# Patient Record
Sex: Female | Born: 1980
Health system: Southern US, Community
[De-identification: ages and names within clinical notes are randomized; demographics above are authoritative.]

## PROBLEM LIST (undated history)

## (undated) DIAGNOSIS — N200 Calculus of kidney: Secondary | ICD-10-CM

## (undated) DIAGNOSIS — Z9889 Other specified postprocedural states: Secondary | ICD-10-CM

## (undated) DIAGNOSIS — S99922A Unspecified injury of left foot, initial encounter: Secondary | ICD-10-CM

## (undated) DIAGNOSIS — K760 Fatty (change of) liver, not elsewhere classified: Secondary | ICD-10-CM

## (undated) DIAGNOSIS — E039 Hypothyroidism, unspecified: Secondary | ICD-10-CM

## (undated) DIAGNOSIS — M109 Gout, unspecified: Secondary | ICD-10-CM

## (undated) DIAGNOSIS — R112 Nausea with vomiting, unspecified: Secondary | ICD-10-CM

## (undated) DIAGNOSIS — E119 Type 2 diabetes mellitus without complications: Secondary | ICD-10-CM

## (undated) DIAGNOSIS — D649 Anemia, unspecified: Secondary | ICD-10-CM

## (undated) HISTORY — PX: NEPHRECTOMY: SHX65

## (undated) HISTORY — PX: URETEROSCOPY: SHX842

## (undated) HISTORY — DX: Calculus of kidney: N20.0

## (undated) HISTORY — PX: OTHER SURGICAL HISTORY: SHX169

## (undated) HISTORY — PX: PERCUTANEOUS NEPHROSTOMY: SHX2208

## (undated) HISTORY — PX: EXCISION OF ENDOMETRIOMA: SHX6473

## (undated) HISTORY — PX: TUBAL LIGATION: SHX77

---

## 2014-07-18 ENCOUNTER — Emergency Department (HOSPITAL_COMMUNITY)
Admission: EM | Admit: 2014-07-18 | Discharge: 2014-07-19 | Disposition: A | Discharge status: Home / Self Care | Payer: BLUE CROSS/BLUE SHIELD | Attending: Emergency Medicine | Admitting: Emergency Medicine

## 2014-07-18 ENCOUNTER — Encounter (HOSPITAL_COMMUNITY): Payer: Self-pay | Admitting: *Deleted

## 2014-07-18 DIAGNOSIS — M79661 Pain in right lower leg: Secondary | ICD-10-CM | POA: Diagnosis present

## 2014-07-18 DIAGNOSIS — L03115 Cellulitis of right lower limb: Secondary | ICD-10-CM | POA: Diagnosis not present

## 2014-07-18 DIAGNOSIS — E119 Type 2 diabetes mellitus without complications: Secondary | ICD-10-CM | POA: Insufficient documentation

## 2014-07-18 DIAGNOSIS — Z87442 Personal history of urinary calculi: Secondary | ICD-10-CM | POA: Insufficient documentation

## 2014-07-18 HISTORY — DX: Hypothyroidism, unspecified: E03.9

## 2014-07-18 HISTORY — DX: Calculus of kidney: N20.0

## 2014-07-18 HISTORY — DX: Type 2 diabetes mellitus without complications: E11.9

## 2014-07-18 NOTE — ED Notes (Signed)
Patient presents with c/o pain to the right upper thigh.  States it is red, warm to touch and feels like it is spreading.  Tried using a warm compress but that did not help

## 2014-07-19 MED ORDER — CEPHALEXIN 250 MG PO CAPS
500.0000 mg | ORAL_CAPSULE | Freq: Once | ORAL | Status: AC
  Administered 2014-07-19: 500 mg via ORAL
  Filled 2014-07-19: qty 2

## 2014-07-19 MED ORDER — SULFAMETHOXAZOLE-TRIMETHOPRIM 800-160 MG PO TABS
1.0000 | ORAL_TABLET | Freq: Once | ORAL | Status: AC
  Administered 2014-07-19: 1 via ORAL
  Filled 2014-07-19: qty 1

## 2014-07-19 MED ORDER — SULFAMETHOXAZOLE-TRIMETHOPRIM 800-160 MG PO TABS: 1.0000 | ORAL_TABLET | Freq: Two times a day (BID) | ORAL | Status: DC

## 2014-07-19 MED ORDER — CEPHALEXIN 500 MG PO CAPS: 500.0000 mg | ORAL_CAPSULE | Freq: Four times a day (QID) | ORAL | Status: DC

## 2014-07-19 NOTE — ED Provider Notes (Signed)
CSN: [id number]     Arrival date & time 07/18/14  2235 History   First MD Initiated Contact with Patient 07/18/14 2345     Chief Complaint  Patient presents with  . Leg Pain     (Consider location/radiation/quality/duration/timing/severity/associated sxs/prior Treatment) HPI Patient is a 34 year old female with past medical history of diabetes who presents the ER complaining of pain and redness to her upper lateral thigh. Patient states this afternoon around 2 PM she noticed what she thought was a pimple, and throughout the day today and tonight her affect at area grew in size and in pain. Patient reports associated redness and tenderness to the touch of the area. Patient denies fever, nausea, vomiting, numbness, weakness, tingling.  Past Medical History  Diagnosis Date  . Diabetes mellitus without complication   . Kidney stones   . Hypothyroidism    Past Surgical History  Procedure Laterality Date  . Nephrectomy     No family history on file. History  Substance Use Topics  . Smoking status: Never Smoker   . Smokeless tobacco: Never Used  . Alcohol Use: No   OB History    No data available     Review of Systems  Constitutional: Negative for fever and chills.  Skin: Positive for rash.      Allergies  Doxycycline and Toradol  Home Medications   Prior to Admission medications   Medication Sig Start Date End Date Taking? Authorizing Provider  cephALEXin (KEFLEX) 500 MG capsule Take 1 capsule (500 mg total) by mouth 4 (four) times daily. 07/19/14   Dahlia Bailiff, PA-C  sulfamethoxazole-trimethoprim (BACTRIM DS,SEPTRA DS) 800-160 MG per tablet Take 1 tablet by mouth 2 (two) times daily. 07/19/14   Dahlia Bailiff, PA-C   BP 132/82 mmHg  Pulse 78  Temp(Src) 98.4 F (36.9 C)  Resp 18  Ht 5\' 4"  (1.626 m)  Wt 272 lb 7 oz (123.577 kg)  BMI 46.74 kg/m2  SpO2 95%  LMP 07/16/2014 Physical Exam  Constitutional: She is oriented to person, place, and time. She appears well-developed  and well-nourished. No distress.  HENT:  Head: Normocephalic and atraumatic.  Eyes: Right eye exhibits no discharge. Left eye exhibits no discharge. No scleral icterus.  Neck: Normal range of motion.  Pulmonary/Chest: Effort normal. No respiratory distress.  Musculoskeletal: Normal range of motion.  Neurological: She is alert and oriented to person, place, and time.  Skin: Skin is warm and dry. She is not diaphoretic.  Patient has 1 x 1 cm area of induration with surrounding erythema of approximately 3 x 3 cm to the lateral posterior aspect of her upper right thigh.  Psychiatric: She has a normal mood and affect.  Nursing note and vitals reviewed.   ED Course  Procedures (including critical care time) Labs Review Labs Reviewed - No data to display  Imaging Review No results found.   EKG Interpretation None      MDM   Final diagnoses:  Cellulitis of right lower extremity    Patient has cutaneous skin cellulitis surrounding. Skin is indurated, not amenable to incision and drainage. Ultrasound was performed, noted was some mild cobblestoning consistent with induration and cellulitis without obvious fluid pocket for drainage. Patient without any systemic signs or symptoms. Initially triage note has patient with tachycardia at 114. On exam patient Lamonte Sakai rate was in the 80s to 90s. Patient afebrile, hemodynamically stable. We'll discharge patient with oral antibiotics due to the fact that she is diabetic, encourage warm soaks, encouraged follow-up  with a primary care physician and provided her a resource guide to help her find one. Return precautions discussed, patient verbalizes understanding and agreement of this plan. I encouraged patient to call or return to the ER with any worsening symptoms or should she have any questions or concerns.  BP 132/82 mmHg  Pulse 78  Temp(Src) 98.4 F (36.9 C)  Resp 18  Ht 5\' 4"  (1.626 m)  Wt 272 lb 7 oz (123.577 kg)  BMI 46.74 kg/m2  SpO2 95%   LMP 07/16/2014  Signed,  Dahlia Bailiff, PA-C 12:29 AM  Patient seen and discussed with Dr. Davonna Belling, MD    Dahlia Bailiff, PA-C 07/19/14 9242  Davonna Belling, MD 07/21/14 [phone]

## 2014-07-19 NOTE — Discharge Instructions (Signed)
Abscess An abscess is an infected area that contains a collection of pus and debris.It can occur in almost any part of the body. An abscess is also known as a furuncle or boil. CAUSES  An abscess occurs when tissue gets infected. This can occur from blockage of oil or sweat glands, infection of hair follicles, or a minor injury to the skin. As the body tries to fight the infection, pus collects in the area and creates pressure under the skin. This pressure causes pain. People with weakened immune systems have difficulty fighting infections and get certain abscesses more often.  SYMPTOMS Usually an abscess develops on the skin and becomes a painful mass that is red, warm, and tender. If the abscess forms under the skin, you may feel a moveable soft area under the skin. Some abscesses break open (rupture) on their own, but most will continue to get worse without care. The infection can spread deeper into the body and eventually into the bloodstream, causing you to feel ill.  DIAGNOSIS  Your caregiver will take your medical history and perform a physical exam. A sample of fluid may also be taken from the abscess to determine what is causing your infection. TREATMENT  Your caregiver may prescribe antibiotic medicines to fight the infection. However, taking antibiotics alone usually does not cure an abscess. Your caregiver may need to make a small cut (incision) in the abscess to drain the pus. In some cases, gauze is packed into the abscess to reduce pain and to continue draining the area. HOME CARE INSTRUCTIONS   Only take over-the-counter or prescription medicines for pain, discomfort, or fever as directed by your caregiver.  If you were prescribed antibiotics, take them as directed. Finish them even if you start to feel better.  If gauze is used, follow your caregiver's directions for changing the gauze.  To avoid spreading the infection:  Keep your draining abscess covered with a  bandage.  Wash your hands well.  Do not share personal care items, towels, or whirlpools with others.  Avoid skin contact with others.  Keep your skin and clothes clean around the abscess.  Keep all follow-up appointments as directed by your caregiver. SEEK MEDICAL CARE IF:   You have increased pain, swelling, redness, fluid drainage, or bleeding.  You have muscle aches, chills, or a general ill feeling.  You have a fever. MAKE SURE YOU:   Understand these instructions.  Will watch your condition.  Will get help right away if you are not doing well or get worse. Document Released: 12/31/2004 Document Revised: 09/22/2011 Document Reviewed: 06/05/2011 [facility] Patient Information 2015 [place], [place]. This information is not intended to replace advice given to you by your health care provider. Make sure you discuss any questions you have with your health care provider.   Emergency Department Resource Guide 1) Find a Doctor and Pay Out of Pocket Although you won't have to find out who is covered by your insurance plan, it is a good idea to ask around and get recommendations. You will then need to call the office and see if the doctor you have chosen will accept you as a new patient and what types of options they offer for patients who are self-pay. Some doctors offer discounts or will set up payment plans for their patients who do not have insurance, but you will need to ask so you aren't surprised when you get to your appointment.  2) Contact Your Local Health Department Not all health departments  have doctors that can see patients for sick visits, but many do, so it is worth a call to see if yours does. If you don't know where your local health department is, you can check in your phone book. The CDC also has a tool to help you locate your state's health department, and many state websites also have listings of all of their local health departments.  3) Find a [facility] If  your illness is not likely to be very severe or complicated, you may want to try a walk in clinic. These are popping up all over the country in pharmacies, drugstores, and shopping centers. They're usually staffed by nurse practitioners or physician assistants that have been trained to treat common illnesses and complaints. They're usually fairly quick and inexpensive. However, if you have serious medical issues or chronic medical problems, these are probably not your best option.  No Primary Care Doctor: - Call Health Connect at  [phone] - they can help you locate a primary care doctor that  accepts your insurance, provides certain services, etc. - Physician Referral Service- [phone]  Chronic Pain Problems: Organization         Address  Phone   Notes  [facility]  [phone] Patients need to be referred by their primary care doctor.   Medication Assistance: Organization         Address  Phone   Notes  [facility] Medication [facility] [address] [place], [place] [address] [phone] --Must be a resident of [facility] -- Must have NO insurance coverage whatsoever (no Medicaid/ Medicare, etc.) -- The pt. MUST have a primary care doctor that directs their care regularly and follows them in the community   MedAssist  [phone]   [employer]  [phone]    Agencies that provide inexpensive medical care: Organization         Address  Phone   Notes  [place]  [phone]   [facility]    [phone]   [facility] [place], [place] [address] [phone]   [place] [address] [place] [phone]   Planned Parenthood    [phone]   [facility]    [phone]   [place] and [address], [place] Phone:  [phone], Fax:  [phone] Hours of  Operation:  9 am - 6 pm, M-F.  Also accepts Medicaid/Medicare and self-pay.  [facility] for [address] [place], [address], [place] Phone: [phone], Fax: [phone]. Hours of Operation:  8:30 am - 5:30 pm, M-F.  Also accepts Medicaid and self-pay.  [facility] [place] [address] [place] Phone: [phone]   [place], [place], [place] [phone], Ext. [phone] Mondays & Thursdays: 7-9 AM.  First 15 patients are seen on a first come, first serve basis.    [place] Providers:  Organization         Address  Phone   Notes  [facility] [address], [place] [phone] Also accepts self-pay patients.  [place], [place]  [phone]   [place], [address], [place] [phone]   [facility]  [address] [place] [phone]   Lucianne Lei [address], [place]   [phone] Only accepts [place] Access [place] patients after they have their name applied to their card.   Self-Pay (no insurance) in [facility]:  Organization         Address  Phone   Notes  Sickle Cell Patients, [facility] [place] [phone]   [facility] [place] [phone]   [facility] [place]  [place], [place], [place] [phone]   [facility]/Dr. Osei-Bonsu  [address] [place] or [address], [place] [phone] Phone number for both [place] and [place] locations is the same.  Urgent Medical and [facility] [address] [place] [phone]   [facility] [address], [place] or [address] [phone]   [facility] [address] [place] [phone], phone; [phone], fax Sees patients 1st and 3rd Saturday of every month.  Must not qualify for public or private insurance (i.e. Medicaid, Medicare, [place] Health Choice, Veterans' Benefits)  Household income should be no more than 200% of the poverty level The clinic cannot treat you if you are pregnant or think you are pregnant  Sexually transmitted diseases are not treated at the clinic.    Dental Care: Organization         Address  Phone  Notes  [facility] Department of [facility] [place] [phone] Accepts children up to age 16 who are enrolled in [place] or [place]; pregnant women with a Medicaid card; and children who have applied for Medicaid or [place] Health Choice, but were declined, whose parents can pay a reduced fee at time of service.  [facility] Department of [facility]  [address], [place] [phone] Accepts children up to age 4 who are enrolled in [place] or [place]; pregnant women with a Medicaid card; and children who have applied for Medicaid or [place] Health Choice, but were declined, whose parents can pay a reduced fee at time of service.  [place] Adult Dental Access PROGRAM  [place] [phone] Patients are seen by appointment only. Walk-ins are not accepted. [place] will see patients 78 years of age and older. Monday - Tuesday (8am-5pm) Most Wednesdays (8:30-5pm) $30 per visit, cash only  [facility] Adult Dental Access PROGRAM  [address], [facility] [phone] Patients are seen by appointment only. Walk-ins are not accepted. [place] will see patients 50 years of age and older. One Wednesday Evening (Monthly: Volunteer Based).  $30 per visit, cash only  [place]  [phone] for adults; Children under age 52, call Graduate Pediatric  Dentistry at [phone]. Children aged 62-14, please call [phone] to request a pediatric application.  Dental services are provided in all areas of dental care including fillings, crowns and bridges, complete and partial dentures, implants, gum treatment, root canals, and extractions. Preventive care is also provided. Treatment is provided to both adults and children. Patients are selected via a lottery and there is often a waiting list.   [facility] [address], [place]  [phone] [email or web address]  [address] [place], [place] [phone], [place] Thursday of each month, opens at 6:30 AM; Clinic ends at 9 AM.  Patients are seen on a first-come first-served basis, and a limited number are seen during each clinic.   [facility]  [address] Hillard Danker [place], [place] [phone]   Eligibility Requirements You must have lived in [place], [place], or [place] counties for at least the last three months.   You cannot be eligible for state or federal sponsored [employer], including [employer], [place], or [employer].   You generally cannot be eligible for healthcare insurance through your employer.    How to apply: Eligibility screenings are held every Tuesday and Wednesday afternoon from 1:00 pm until 4:00 pm. You do not need an appointment for the interview!  [facility] [address], [place], [place]   [place]  [place] Department  [place]  [phone]    Behavioral Health Resources in the Community: Intensive Outpatient Programs Organization         Address  Phone  Notes  [place]. [address] [place], [place] [phone]   [facility] [address], [place], [place]   ADS:  [facility] Drug [facility] [address], [place], [place]   [place] [address]  [place], [place] or [phone]   Substance Abuse Resources Organization         Address  Phone  Notes  Alcohol [facility] Drug Services  [phone]   [place]  [phone]   [facility] [place]   Chinita Pester  [phone]   Residential & Outpatient Substance Abuse Program  [phone]   Psychological Services Organization         Address  Phone  Notes  [facility]  [place]  [phone]   [place] [address] [place] or [phone]    Mobile Crisis Teams Organization         Address  Phone  Notes  [facility], Mobile Crisis Care Unit  [phone]   [facility]  [address] [place], [place]   [place] [address] [place] [phone]    Self-Help/Support Groups Organization         Address  Phone             Notes  [place]. of [place] - variety of support groups  [place] Call for more information  Narcotics Anonymous (NA), Caring Services [address], [employer] [place]  2 meetings at this location   Special educational needs teacher         Address  Phone  Notes  [facility] Residential Treatment [place],    [place]  [phone]   [facility]  [address], [place] [phone], [place], [place]   [place], [place] [phone] Admissions: 8am-3pm M-F  Incentives Substance [place] [address],    [place], [place] [phone]   [facility] [address] Jadene Pierini [place], [place]   The [place].,  [place], [place] - Intensive Outpatient [address] Kristeen [address],  [place], [place]   [facility] ([place].,  [place], [place] [phone] or [phone]   [facility]idential Treatment Services (RTS) [address], [place], [place] Accepts Medicaid  [facility] [place] [address]  [place] [phone] Substance Abuse/Addiction Treatment   [facility] Organization         Address  Phone  Notes  [facility]  [phone]   Domenic Schwab, PhD [address] Arlis Porta [place], [place]   [phone] or [phone]   [place]   [address] [place], [place] [phone]   [address], [place], [place] [phone] Insurance/Medicaid/sponsorship through [facility] and Families [address] [place]                                    [place], [place] [phone] [place] [address] [place], [place] [phone]    Dr. Adele Schilder  [phone]   [facility] Dept. 1) [address], [place] 2) [place] 3)  [place] [address], [place] [phone]  [phone]   [place] [phone] or [phone] (After Hours)

## 2014-08-08 ENCOUNTER — Encounter (HOSPITAL_COMMUNITY): Payer: Self-pay | Admitting: *Deleted

## 2014-08-08 ENCOUNTER — Emergency Department (HOSPITAL_COMMUNITY)
Admission: EM | Admit: 2014-08-08 | Discharge: 2014-08-09 | Disposition: A | Discharge status: Home / Self Care | Payer: BLUE CROSS/BLUE SHIELD | Attending: Emergency Medicine | Admitting: Emergency Medicine

## 2014-08-08 DIAGNOSIS — Z3202 Encounter for pregnancy test, result negative: Secondary | ICD-10-CM | POA: Diagnosis not present

## 2014-08-08 DIAGNOSIS — Z792 Long term (current) use of antibiotics: Secondary | ICD-10-CM | POA: Insufficient documentation

## 2014-08-08 DIAGNOSIS — R112 Nausea with vomiting, unspecified: Secondary | ICD-10-CM | POA: Diagnosis not present

## 2014-08-08 DIAGNOSIS — Z87442 Personal history of urinary calculi: Secondary | ICD-10-CM | POA: Diagnosis not present

## 2014-08-08 DIAGNOSIS — R000 Tachycardia, unspecified: Secondary | ICD-10-CM | POA: Diagnosis not present

## 2014-08-08 DIAGNOSIS — R109 Unspecified abdominal pain: Secondary | ICD-10-CM

## 2014-08-08 DIAGNOSIS — E119 Type 2 diabetes mellitus without complications: Secondary | ICD-10-CM | POA: Insufficient documentation

## 2014-08-08 LAB — URINALYSIS, ROUTINE W REFLEX MICROSCOPIC
Bilirubin Urine: NEGATIVE
Glucose, UA: >1000 mg/dL — AB
Hgb urine dipstick: NEGATIVE
KETONES UR: NEGATIVE mg/dL
NITRITE: NEGATIVE
PH: 6 (ref 5.0–8.0)
Protein, ur: >300 mg/dL — AB
SPECIFIC GRAVITY, URINE: 1.025 (ref 1.005–1.030)
Urobilinogen, UA: 0.2 mg/dL (ref 0.0–1.0)

## 2014-08-08 LAB — CBC WITH DIFFERENTIAL/PLATELET
BASOS PCT: 0 % (ref 0–1)
Basophils Absolute: 0 10*3/uL (ref 0.0–0.1)
Eosinophils Absolute: 0.2 10*3/uL (ref 0.0–0.7)
Eosinophils Relative: 3 % (ref 0–5)
HEMATOCRIT: 42.8 % (ref 36.0–46.0)
HEMOGLOBIN: 14.6 g/dL (ref 12.0–15.0)
LYMPHS PCT: 39 % (ref 12–46)
Lymphs Abs: 2.8 10*3/uL (ref 0.7–4.0)
MCH: 31.8 pg (ref 26.0–34.0)
MCHC: 34.1 g/dL (ref 30.0–36.0)
MCV: 93.2 fL (ref 78.0–100.0)
MONO ABS: 0.4 10*3/uL (ref 0.1–1.0)
Monocytes Relative: 5 % (ref 3–12)
NEUTROS ABS: 3.8 10*3/uL (ref 1.7–7.7)
NEUTROS PCT: 53 % (ref 43–77)
Platelets: 302 10*3/uL (ref 150–400)
RBC: 4.59 MIL/uL (ref 3.87–5.11)
RDW: 13.7 % (ref 11.5–15.5)
WBC: 7.3 10*3/uL (ref 4.0–10.5)

## 2014-08-08 LAB — COMPREHENSIVE METABOLIC PANEL
ALBUMIN: 3.1 g/dL — AB (ref 3.5–5.0)
ALK PHOS: 69 U/L (ref 38–126)
ALT: 25 U/L (ref 14–54)
ANION GAP: 12 (ref 5–15)
AST: 23 U/L (ref 15–41)
BUN: 19 mg/dL (ref 6–20)
CO2: 21 mmol/L — ABNORMAL LOW (ref 22–32)
Calcium: 9.3 mg/dL (ref 8.9–10.3)
Chloride: 100 mmol/L — ABNORMAL LOW (ref 101–111)
Creatinine, Ser: 0.88 mg/dL (ref 0.44–1.00)
GFR calc Af Amer: >60 mL/min (ref >60)
GFR calc non Af Amer: >60 mL/min (ref >60)
Glucose, Bld: 339 mg/dL — ABNORMAL HIGH (ref 70–99)
Potassium: 4.1 mmol/L (ref 3.5–5.1)
Sodium: 133 mmol/L — ABNORMAL LOW (ref 135–145)
TOTAL PROTEIN: 6.8 g/dL (ref 6.5–8.1)
Total Bilirubin: 0.5 mg/dL (ref 0.3–1.2)

## 2014-08-08 LAB — LIPASE, BLOOD: Lipase: 55 U/L — ABNORMAL HIGH (ref 22–51)

## 2014-08-08 LAB — URINE MICROSCOPIC-ADD ON

## 2014-08-08 NOTE — ED Notes (Signed)
Pt c/o left flank pain radiating to left lower abdomen. Pt reports hx of kidney stone. Pt does not have a right kidney due to a staghorn kidney stone when she was 34 years old. Pt reports n/v with the pain.

## 2014-08-09 ENCOUNTER — Encounter (HOSPITAL_COMMUNITY): Payer: Self-pay | Admitting: Emergency Medicine

## 2014-08-09 ENCOUNTER — Emergency Department (HOSPITAL_COMMUNITY): Payer: BLUE CROSS/BLUE SHIELD

## 2014-08-09 LAB — PREGNANCY, URINE: PREG TEST UR: NEGATIVE

## 2014-08-09 LAB — CBG MONITORING, ED: Glucose-Capillary: 257 mg/dL — ABNORMAL HIGH (ref 70–99)

## 2014-08-09 MED ORDER — ONDANSETRON HCL 4 MG PO TABS: 4.0000 mg | ORAL_TABLET | Freq: Four times a day (QID) | ORAL | Status: DC

## 2014-08-09 MED ORDER — HYDROMORPHONE HCL 1 MG/ML IJ SOLN
1.0000 mg | Freq: Once | INTRAMUSCULAR | Status: AC
  Administered 2014-08-09: 1 mg via INTRAVENOUS
  Filled 2014-08-09: qty 1

## 2014-08-09 MED ORDER — PHENAZOPYRIDINE HCL 200 MG PO TABS: 200.0000 mg | ORAL_TABLET | Freq: Three times a day (TID) | ORAL | Status: DC

## 2014-08-09 MED ORDER — ONDANSETRON HCL 4 MG/2ML IJ SOLN
4.0000 mg | Freq: Once | INTRAMUSCULAR | Status: AC
  Administered 2014-08-09: 4 mg via INTRAVENOUS
  Filled 2014-08-09: qty 2

## 2014-08-09 NOTE — Discharge Instructions (Signed)
Flank Pain °Flank pain refers to pain that is located on the side of the body between the upper abdomen and the back. The pain may occur over a short period of time (acute) or may be long-term or reoccurring (chronic). It may be mild or severe. Flank pain can be caused by many things. °CAUSES  °Some of the more common causes of flank pain include: °· Muscle strains.   °· Muscle spasms.   °· A disease of your spine (vertebral disk disease).   °· A lung infection (pneumonia).   °· Fluid around your lungs (pulmonary edema).   °· A kidney infection.   °· Kidney stones.   °· A very painful skin rash caused by the chickenpox virus (shingles).   °· Gallbladder disease.   °HOME CARE INSTRUCTIONS  °Home care will depend on the cause of your pain. In general, °· Rest as directed by your caregiver. °· Drink enough fluids to keep your urine clear or pale yellow. °· Only take over-the-counter or prescription medicines as directed by your caregiver. Some medicines may help relieve the pain. °· Tell your caregiver about any changes in your pain. °· Follow up with your caregiver as directed. °SEEK IMMEDIATE MEDICAL CARE IF:  °· Your pain is not controlled with medicine.   °· You have new or worsening symptoms. °· Your pain increases.   °· You have abdominal pain.   °· You have shortness of breath.   °· You have persistent nausea or vomiting.   °· You have swelling in your abdomen.   °· You feel faint or pass out.   °· You have blood in your urine. °· You have a fever or persistent symptoms for more than 2-3 days. °· You have a fever and your symptoms suddenly get worse. °MAKE SURE YOU:  °· Understand these instructions. °· Will watch your condition. °· Will get help right away if you are not doing well or get worse. °Document Released: 05/14/2005 Document Revised: 12/16/2011 Document Reviewed: 11/05/2011 °ExitCare® Patient Information ©2015 [employer]. This information is not intended to replace advice given to you by your  health care provider. Make sure you discuss any questions you have with your health care provider. ° °

## 2014-08-09 NOTE — ED Notes (Signed)
Patient transported to CT 

## 2014-08-09 NOTE — ED Provider Notes (Signed)
CSN: [id number]     Arrival date & time 08/08/14  2207 History   First MD Initiated Contact with Patient 08/08/14 2325     Chief Complaint  Patient presents with  . Flank Pain     (Consider location/radiation/quality/duration/timing/severity/associated sxs/prior Treatment) Patient is a 34 y.o. female presenting with flank pain. The history is provided by the patient. No language interpreter was used.  Flank Pain This is a recurrent problem. The current episode started in the past 7 days. The problem occurs constantly. Associated symptoms include nausea and vomiting. Pertinent negatives include no abdominal pain, chest pain, fever or weakness. Associated symptoms comments: Patient with a complicated history of kidney stones since childhood, including a staghorn kidney stone causing right nephrectomy at the age of 31. Current symptoms include left flank pain, nausea, vomiting. No fever. Pain has been uncontrolled at home with Tylenol and hydrocodone. She is new to the area and has not established with PCP or urology yet. .    Past Medical History  Diagnosis Date  . Diabetes mellitus without complication   . Kidney stones   . Hypothyroidism    Past Surgical History  Procedure Laterality Date  . Nephrectomy     History reviewed. No pertinent family history. History  Substance Use Topics  . Smoking status: Never Smoker   . Smokeless tobacco: Never Used  . Alcohol Use: No   OB History    No data available     Review of Systems  Constitutional: Negative for fever.  Respiratory: Negative for shortness of breath.   Cardiovascular: Negative for chest pain.  Gastrointestinal: Positive for nausea and vomiting. Negative for abdominal pain.  Genitourinary: Positive for flank pain and difficulty urinating.  Neurological: Negative for weakness.      Allergies  Doxycycline and Toradol  Home Medications   Prior to Admission medications   Medication Sig Start Date End Date Taking?  Authorizing Provider  cephALEXin (KEFLEX) 500 MG capsule Take 1 capsule (500 mg total) by mouth 4 (four) times daily. 07/19/14   Dahlia Bailiff, PA-C  sulfamethoxazole-trimethoprim (BACTRIM DS,SEPTRA DS) 800-160 MG per tablet Take 1 tablet by mouth 2 (two) times daily. 07/19/14   Dahlia Bailiff, PA-C   BP 146/102 mmHg  Pulse 106  Temp(Src) 98.1 F (36.7 C) (Oral)  Resp 20  Ht 5\' 4"  (1.626 m)  Wt 270 lb (122.471 kg)  BMI 46.32 kg/m2  SpO2 96%  LMP 07/16/2014 (Exact Date) Physical Exam  Constitutional: She is oriented to person, place, and time. She appears well-developed and well-nourished. No distress.  Uncomfortable in appearance.  Neck: Neck supple.  Cardiovascular: Regular rhythm.  Tachycardia present.   Pulmonary/Chest: Effort normal. She has no wheezes. She has no rales.  Abdominal: Soft. She exhibits no mass. There is tenderness.  Tender in left side and left abdominal wall. No swelling/distention, discoloration.   Genitourinary:  Left flank tenderness.   Neurological: She is alert and oriented to person, place, and time.  Skin: Skin is warm and dry.  Psychiatric: She has a normal mood and affect.  Nursing note and vitals reviewed.   ED Course  Procedures (including critical care time) Labs Review Labs Reviewed  COMPREHENSIVE METABOLIC PANEL - Abnormal; Notable for the following:    Sodium 133 (*)    Chloride 100 (*)    CO2 21 (*)    Glucose, Bld 339 (*)    Albumin 3.1 (*)    All other components within normal limits  LIPASE, BLOOD - Abnormal;  Notable for the following:    Lipase 55 (*)    All other components within normal limits  URINALYSIS, ROUTINE W REFLEX MICROSCOPIC - Abnormal; Notable for the following:    APPearance CLOUDY (*)    Glucose, UA >1000 (*)    Protein, ur >300 (*)    Leukocytes, UA TRACE (*)    All other components within normal limits  URINE MICROSCOPIC-ADD ON - Abnormal; Notable for the following:    Squamous Epithelial / LPF MANY (*)    All other  components within normal limits  CBC WITH DIFFERENTIAL/PLATELET  POC URINE PREG, ED   Results for orders placed or performed during the hospital encounter of 08/08/14  Urine culture  Result Value Ref Range   Specimen Description URINE, RANDOM    Special Requests NONE    Colony Count      >=100,000 COLONIES/ML Performed at [facility]    Culture      Multiple bacterial morphotypes present, none predominant. Suggest appropriate recollection if clinically indicated. Performed at [facility]    Report Status 08/10/2014 FINAL   CBC with Differential  Result Value Ref Range   WBC 7.3 4.0 - 10.5 K/uL   RBC 4.59 3.87 - 5.11 MIL/uL   Hemoglobin 14.6 12.0 - 15.0 g/dL   HCT 42.8 36.0 - 46.0 %   MCV 93.2 78.0 - 100.0 fL   MCH 31.8 26.0 - 34.0 pg   MCHC 34.1 30.0 - 36.0 g/dL   RDW 13.7 11.5 - 15.5 %   Platelets 302 150 - 400 K/uL   Neutrophils Relative % 53 43 - 77 %   Neutro Abs 3.8 1.7 - 7.7 K/uL   Lymphocytes Relative 39 12 - 46 %   Lymphs Abs 2.8 0.7 - 4.0 K/uL   Monocytes Relative 5 3 - 12 %   Monocytes Absolute 0.4 0.1 - 1.0 K/uL   Eosinophils Relative 3 0 - 5 %   Eosinophils Absolute 0.2 0.0 - 0.7 K/uL   Basophils Relative 0 0 - 1 %   Basophils Absolute 0.0 0.0 - 0.1 K/uL  Comprehensive metabolic panel  Result Value Ref Range   Sodium 133 (L) 135 - 145 mmol/L   Potassium 4.1 3.5 - 5.1 mmol/L   Chloride 100 (L) 101 - 111 mmol/L   CO2 21 (L) 22 - 32 mmol/L   Glucose, Bld 339 (H) 70 - 99 mg/dL   BUN 19 6 - 20 mg/dL   Creatinine, Ser 0.88 0.44 - 1.00 mg/dL   Calcium 9.3 8.9 - 10.3 mg/dL   Total Protein 6.8 6.5 - 8.1 g/dL   Albumin 3.1 (L) 3.5 - 5.0 g/dL   AST 23 15 - 41 U/L   ALT 25 14 - 54 U/L   Alkaline Phosphatase 69 38 - 126 U/L   Total Bilirubin 0.5 0.3 - 1.2 mg/dL   GFR calc non Af Amer >60 >60 mL/min   GFR calc Af Amer >60 >60 mL/min   Anion gap 12 5 - 15  Lipase, blood  Result Value Ref Range   Lipase 55 (H) 22 - 51 U/L  Urinalysis, Routine  w reflex microscopic  Result Value Ref Range   Color, Urine YELLOW YELLOW   APPearance CLOUDY (A) CLEAR   Specific Gravity, Urine 1.025 1.005 - 1.030   pH 6.0 5.0 - 8.0   Glucose, UA >1000 (A) NEGATIVE mg/dL   Hgb urine dipstick NEGATIVE NEGATIVE   Bilirubin Urine NEGATIVE NEGATIVE  Ketones, ur NEGATIVE NEGATIVE mg/dL   Protein, ur >300 (A) NEGATIVE mg/dL   Urobilinogen, UA 0.2 0.0 - 1.0 mg/dL   Nitrite NEGATIVE NEGATIVE   Leukocytes, UA TRACE (A) NEGATIVE  Urine microscopic-add on  Result Value Ref Range   Squamous Epithelial / LPF MANY (A) RARE   WBC, UA 7-10 <3 WBC/hpf   RBC / HPF 0-2 <3 RBC/hpf   Bacteria, UA RARE RARE  Pregnancy, urine  Result Value Ref Range   Preg Test, Ur NEGATIVE NEGATIVE  POC CBG, ED  Result Value Ref Range   Glucose-Capillary 257 (H) 70 - 99 mg/dL   Ct Abdomen Pelvis Wo Contrast  08/09/2014   CLINICAL DATA:  Left flank pain this evening.  EXAM: CT ABDOMEN AND PELVIS WITHOUT CONTRAST  TECHNIQUE: Multidetector CT imaging of the abdomen and pelvis was performed following the standard protocol without IV contrast.  COMPARISON:  None.  FINDINGS: There is a 2 mm mid pole left collecting system calculus. There is no hydronephrosis. There is no ureteral dilatation or periureteral stranding. There is prior right nephrectomy. There is a 4.7 cm low-attenuation lesion in the right nephrectomy bed.  There are unremarkable unenhanced appearances of the liver, spleen, pancreas and adrenals. The appendix is normal. There is mild nonspecific dilatation of proximal jejunum without caliber transition.  Uterus and ovaries appear unremarkable.  The abdominal aorta is normal in caliber without atherosclerotic calcification.  No acute inflammatory changes are evident in the abdomen or pelvis. There is no adenopathy. There is no ascites.  There is no significant abnormality in the lower chest.  There is no significant musculoskeletal abnormality.  IMPRESSION: *Left nephrolithiasis. No  ureteral obstruction or ureteral calculus. *Mild nonspecific dilatation of proximal jejunum without caliber transition and without focal inflammatory change. This is of doubtful significance in this clinical setting. *4.7 cm low-attenuation lesion in the right nephrectomy bed, probably a benign cyst. Ultrasound or MRI would be helpful to conclusively characterize this abnormality if it is not already been characterized on prior imaging.   Electronically Signed   By: Andreas Newport M.D.   On: 08/09/2014 01:46    Imaging Review No results found.   EKG Interpretation None      MDM   Final diagnoses:  None    1. Left flank pain  No ureteral stone visualized on CT scan today. There is nephrolithiasis. No fever. The patient is felt stable for discharge home with urology referral. She currently has an appointment with a primary care provider and is encouraged to keep this appointment.     Charlann Lange, PA-C 08/13/14 2149  Veatrice Kells, MD 08/14/14 [phone]

## 2014-08-10 LAB — URINE CULTURE: Colony Count: >=100000

## 2014-08-12 NOTE — ED Provider Notes (Signed)
Medical screening examination/treatment/procedure(s) were performed by non-physician practitioner and as supervising physician I was immediately available for consultation/collaboration.   EKG Interpretation None       Analyce Tavares, MD 08/12/14 [phone]

## 2014-08-22 ENCOUNTER — Other Ambulatory Visit (HOSPITAL_COMMUNITY)
Admission: RE | Admit: 2014-08-22 | Discharge: 2014-08-22 | Disposition: A | Discharge status: Home / Self Care | Payer: BLUE CROSS/BLUE SHIELD | Source: Ambulatory Visit | Attending: Advanced Practice Midwife | Admitting: Advanced Practice Midwife

## 2014-08-22 ENCOUNTER — Ambulatory Visit (INDEPENDENT_AMBULATORY_CARE_PROVIDER_SITE_OTHER): Payer: BLUE CROSS/BLUE SHIELD | Admitting: Advanced Practice Midwife

## 2014-08-22 ENCOUNTER — Encounter: Payer: Self-pay | Admitting: Advanced Practice Midwife

## 2014-08-22 VITALS — BP 108/60 | HR 92 | Ht 64.0 in | Wt 269.0 lb

## 2014-08-22 DIAGNOSIS — Z01419 Encounter for gynecological examination (general) (routine) without abnormal findings: Secondary | ICD-10-CM | POA: Insufficient documentation

## 2014-08-22 DIAGNOSIS — Z1151 Encounter for screening for human papillomavirus (HPV): Secondary | ICD-10-CM | POA: Insufficient documentation

## 2014-08-22 DIAGNOSIS — N200 Calculus of kidney: Secondary | ICD-10-CM | POA: Insufficient documentation

## 2014-08-22 DIAGNOSIS — E039 Hypothyroidism, unspecified: Secondary | ICD-10-CM | POA: Insufficient documentation

## 2014-08-22 DIAGNOSIS — E119 Type 2 diabetes mellitus without complications: Secondary | ICD-10-CM | POA: Insufficient documentation

## 2014-08-22 MED ORDER — FLUCONAZOLE 150 MG PO TABS: ORAL_TABLET | ORAL | Status: DC

## 2014-08-22 NOTE — Patient Instructions (Signed)
megmegace

## 2014-08-22 NOTE — Progress Notes (Signed)
Alison Fitzgerald 34 y.o.  Filed Vitals:   08/22/14 1051  BP: 108/60  Pulse: 92     Past Medical History: Past Medical History  Diagnosis Date  . Diabetes mellitus without complication   . Hypothyroidism   . Kidney stones   . Staghorn kidney stones     right nephrectomy     Past Surgical History: Past Surgical History  Procedure Laterality Date  . Nephrectomy      Family History: Family History  Problem Relation Age of Onset  . Ovarian cancer Paternal Grandmother   . Diabetes Maternal Grandmother   . Diabetes Mother     Social History: History  Substance Use Topics  . Smoking status: Never Smoker   . Smokeless tobacco: Never Used  . Alcohol Use: No    Allergies:  Allergies  Allergen Reactions  . Doxycycline Anaphylaxis  . Toradol [Ketorolac Tromethamine] Hives  . Food Swelling    Jalapeno seeds      Current outpatient prescriptions:  .  insulin glargine (LANTUS) 100 UNIT/ML injection, Inject into the skin at bedtime., Disp: , Rfl:  .  insulin regular (NOVOLIN R,HUMULIN R) 100 units/mL injection, Inject 5 Units into the skin 3 (three) times daily before meals., Disp: , Rfl:  .  levothyroxine (SYNTHROID, LEVOTHROID) 200 MCG tablet, Take 200 mcg by mouth daily before breakfast., Disp: , Rfl:  .  metFORMIN (GLUCOPHAGE-XR) 500 MG 24 hr tablet, Take 500 mg by mouth 2 (two) times daily., Disp: , Rfl:  .  potassium citrate (UROCIT-K) 10 MEQ (1080 MG) SR tablet, Take 10 mEq by mouth 4 (four) times daily., Disp: , Rfl:  .  cephALEXin (KEFLEX) 500 MG capsule, Take 1 capsule (500 mg total) by mouth 4 (four) times daily. (Patient not taking: Reported on 08/09/2014), Disp: 28 capsule, Rfl: 0 .  HYDROcodone-acetaminophen (NORCO/VICODIN) 5-325 MG per tablet, Take 1 tablet by mouth every 4 (four) hours as needed for moderate pain., Disp: , Rfl:  .  ondansetron (ZOFRAN) 4 MG tablet, Take 1 tablet (4 mg total) by mouth every 6 (six) hours. (Patient not taking: Reported  on 08/22/2014), Disp: 12 tablet, Rfl: 0 .  phenazopyridine (PYRIDIUM) 200 MG tablet, Take 1 tablet (200 mg total) by mouth 3 (three) times daily. (Patient not taking: Reported on 08/22/2014), Disp: 6 tablet, Rfl: 0 .  sulfamethoxazole-trimethoprim (BACTRIM DS,SEPTRA DS) 800-160 MG per tablet, Take 1 tablet by mouth 2 (two) times daily. (Patient not taking: Reported on 08/09/2014), Disp: 14 tablet, Rfl: 0  History of Present Illness: Here for pap and physical.  Last pap 3 years ago, normal.  Has never had an abnormal pap.     Review of Systems   Patient denies any headaches, blurred vision, shortness of breath, chest pain, abdominal pain, problems with bowel movements, urination, or intercourse. C/O "bleeding off and on for the past 32 days"  For the past month, she would bleed "heavy" for 3-4 days, then stop for a week or so, then do it again, since January.  Had [place] 2007, COC's would make her produce more kidney stones.  CT revealed normal uterus and ovaries.    Physical Exam: General:  Well developed, well nourished, no acute distress Skin:  Warm and dry Neck:  Midline trachea, normal thyroid Lungs; Clear to auscultation bilaterally Breast:  No dominant palpable mass, retraction, or nipple discharge Cardiovascular: Regular rate and rhythm Abdomen:  Soft, non tender, no hepatosplenomegaly Pelvic:  External genitalia is normal in appearance.  The vagina is  normal in appearance.  The cervix is bulbous and friable.  Uterus is felt to be normal size, shape, and contour.  No adnexal masses or tenderness noted. But exam limited by body habitus. Extremities:  No swelling or varicosities noted Psych:  No mood changes.     Impression: pap and physcial Abnormal uterine bleeding    Plan: If pap normal, repeat q 3 years Options for AUB given:  Lyletta, megace, ablation.  Risks of kidney stones seems less with IUD, but pt will think about her options and let me know.    Labs [employer] pays for  [facility]) TSH, lipids, CBC, CMP

## 2014-08-22 NOTE — Addendum Note (Signed)
Addended by: Doyne Keel on: 08/22/2014 12:16 PM   Modules accepted: Orders

## 2014-08-24 LAB — CYTOLOGY - PAP

## 2014-10-04 ENCOUNTER — Encounter: Payer: Self-pay | Admitting: Internal Medicine

## 2014-10-28 ENCOUNTER — Emergency Department (HOSPITAL_COMMUNITY)
Admission: EM | Admit: 2014-10-28 | Discharge: 2014-10-29 | Disposition: A | Discharge status: Home / Self Care | Payer: BLUE CROSS/BLUE SHIELD | Attending: Emergency Medicine | Admitting: Emergency Medicine

## 2014-10-28 ENCOUNTER — Emergency Department (HOSPITAL_COMMUNITY): Payer: BLUE CROSS/BLUE SHIELD

## 2014-10-28 ENCOUNTER — Encounter (HOSPITAL_COMMUNITY): Payer: Self-pay | Admitting: Emergency Medicine

## 2014-10-28 DIAGNOSIS — Z794 Long term (current) use of insulin: Secondary | ICD-10-CM | POA: Diagnosis not present

## 2014-10-28 DIAGNOSIS — Z3202 Encounter for pregnancy test, result negative: Secondary | ICD-10-CM | POA: Diagnosis not present

## 2014-10-28 DIAGNOSIS — Z87442 Personal history of urinary calculi: Secondary | ICD-10-CM | POA: Insufficient documentation

## 2014-10-28 DIAGNOSIS — J069 Acute upper respiratory infection, unspecified: Secondary | ICD-10-CM | POA: Insufficient documentation

## 2014-10-28 DIAGNOSIS — Z79899 Other long term (current) drug therapy: Secondary | ICD-10-CM | POA: Diagnosis not present

## 2014-10-28 DIAGNOSIS — E119 Type 2 diabetes mellitus without complications: Secondary | ICD-10-CM | POA: Diagnosis not present

## 2014-10-28 DIAGNOSIS — E871 Hypo-osmolality and hyponatremia: Secondary | ICD-10-CM | POA: Diagnosis not present

## 2014-10-28 DIAGNOSIS — R000 Tachycardia, unspecified: Secondary | ICD-10-CM | POA: Insufficient documentation

## 2014-10-28 DIAGNOSIS — R5381 Other malaise: Secondary | ICD-10-CM | POA: Diagnosis not present

## 2014-10-28 DIAGNOSIS — R509 Fever, unspecified: Secondary | ICD-10-CM | POA: Diagnosis present

## 2014-10-28 DIAGNOSIS — E039 Hypothyroidism, unspecified: Secondary | ICD-10-CM | POA: Insufficient documentation

## 2014-10-28 LAB — COMPREHENSIVE METABOLIC PANEL
ALK PHOS: 79 U/L (ref 38–126)
ALT: 22 U/L (ref 14–54)
AST: 20 U/L (ref 15–41)
Albumin: 3.1 g/dL — ABNORMAL LOW (ref 3.5–5.0)
Anion gap: 12 (ref 5–15)
BUN: 10 mg/dL (ref 6–20)
CHLORIDE: 94 mmol/L — AB (ref 101–111)
CO2: 23 mmol/L (ref 22–32)
Calcium: 8.7 mg/dL — ABNORMAL LOW (ref 8.9–10.3)
Creatinine, Ser: 0.93 mg/dL (ref 0.44–1.00)
GLUCOSE: 255 mg/dL — AB (ref 65–99)
Potassium: 3.9 mmol/L (ref 3.5–5.1)
SODIUM: 129 mmol/L — AB (ref 135–145)
Total Bilirubin: 0.6 mg/dL (ref 0.3–1.2)
Total Protein: 6.8 g/dL (ref 6.5–8.1)

## 2014-10-28 LAB — URINALYSIS, ROUTINE W REFLEX MICROSCOPIC
Bilirubin Urine: NEGATIVE
Glucose, UA: 500 mg/dL — AB
Ketones, ur: NEGATIVE mg/dL
LEUKOCYTES UA: NEGATIVE
Nitrite: NEGATIVE
PH: 6.5 (ref 5.0–8.0)
Specific Gravity, Urine: 1.024 (ref 1.005–1.030)
Urobilinogen, UA: 1 mg/dL (ref 0.0–1.0)

## 2014-10-28 LAB — CBC WITH DIFFERENTIAL/PLATELET
Basophils Absolute: 0 10*3/uL (ref 0.0–0.1)
Basophils Relative: 0 % (ref 0–1)
EOS ABS: 0.1 10*3/uL (ref 0.0–0.7)
Eosinophils Relative: 2 % (ref 0–5)
HCT: 41.6 % (ref 36.0–46.0)
HEMOGLOBIN: 14.1 g/dL (ref 12.0–15.0)
LYMPHS ABS: 1.8 10*3/uL (ref 0.7–4.0)
LYMPHS PCT: 22 % (ref 12–46)
MCH: 32.1 pg (ref 26.0–34.0)
MCHC: 33.9 g/dL (ref 30.0–36.0)
MCV: 94.8 fL (ref 78.0–100.0)
MONOS PCT: 6 % (ref 3–12)
Monocytes Absolute: 0.5 10*3/uL (ref 0.1–1.0)
Neutro Abs: 5.7 10*3/uL (ref 1.7–7.7)
Neutrophils Relative %: 70 % (ref 43–77)
Platelets: 279 10*3/uL (ref 150–400)
RBC: 4.39 MIL/uL (ref 3.87–5.11)
RDW: 13.4 % (ref 11.5–15.5)
WBC: 8.1 10*3/uL (ref 4.0–10.5)

## 2014-10-28 LAB — URINE MICROSCOPIC-ADD ON

## 2014-10-28 LAB — POC URINE PREG, ED: PREG TEST UR: NEGATIVE

## 2014-10-28 LAB — RAPID STREP SCREEN (MED CTR MEBANE ONLY): Streptococcus, Group A Screen (Direct): NEGATIVE

## 2014-10-28 MED ORDER — SODIUM CHLORIDE 0.9 % IV BOLUS (SEPSIS)
500.0000 mL | Freq: Once | INTRAVENOUS | Status: AC
  Administered 2014-10-28: 500 mL via INTRAVENOUS

## 2014-10-28 MED ORDER — ACETAMINOPHEN 325 MG PO TABS
650.0000 mg | ORAL_TABLET | Freq: Once | ORAL | Status: AC
  Administered 2014-10-28: 650 mg via ORAL
  Filled 2014-10-28: qty 2

## 2014-10-28 MED ORDER — SODIUM CHLORIDE 0.9 % IV BOLUS (SEPSIS)
1000.0000 mL | Freq: Once | INTRAVENOUS | Status: AC
  Administered 2014-10-28: 1000 mL via INTRAVENOUS

## 2014-10-28 NOTE — ED Notes (Signed)
Patient attempting to provide a urine sample.

## 2014-10-28 NOTE — ED Provider Notes (Signed)
CSN: [id number]     Arrival date & time 10/28/14  1922 History   First MD Initiated Contact with Patient 10/28/14 2034     Chief Complaint  Patient presents with  . Fever  . Cough     Patient is a 34 y.o. female presenting with fever and cough. The history is provided by the patient. No language interpreter was used.  Fever Associated symptoms: cough   Cough Associated symptoms: fever    Ms. Coin  Presents for evaluation of fever and cough. Yesterday she developed a tickle in her throat and generalized body aches. Today she developed fever and productive cough. She has a discomfort in her throat she relates to coughing and postnasal drip. She recently worked at a summer camp for youth. She has no known sick contacts. She is a diabetic. Just generalized malaise. She denies any  Vomiting, abdominal pain, dysuria. She did have some diarrhea which has since resolved. Symptoms are moderate, constant, worsening.  Past Medical History  Diagnosis Date  . Diabetes mellitus without complication   . Hypothyroidism   . Kidney stones   . Staghorn kidney stones     right nephrectomy    Past Surgical History  Procedure Laterality Date  . Nephrectomy     Family History  Problem Relation Age of Onset  . Ovarian cancer Paternal Grandmother   . Diabetes Maternal Grandmother   . Diabetes Mother    History  Substance Use Topics  . Smoking status: Never Smoker   . Smokeless tobacco: Never Used  . Alcohol Use: No   OB History    No data available     Review of Systems  Constitutional: Positive for fever.  Respiratory: Positive for cough.   All other systems reviewed and are negative.     Allergies  Doxycycline; Toradol; and Food  Home Medications   Prior to Admission medications   Medication Sig Start Date End Date Taking? Authorizing Provider  insulin regular (NOVOLIN R,HUMULIN R) 100 units/mL injection Inject 5 Units into the skin 3 (three) times daily before meals.    Yes Historical Provider, MD  LANTUS SOLOSTAR 100 UNIT/ML Solostar Pen Inject 10 Units into the skin at bedtime. 09/27/14  Yes Historical Provider, MD  lisinopril (PRINIVIL,ZESTRIL) 10 MG tablet Take 10 mg by mouth daily. 09/27/14  Yes Historical Provider, MD  metFORMIN (GLUCOPHAGE-XR) 500 MG 24 hr tablet Take 500 mg by mouth 2 (two) times daily.   Yes Historical Provider, MD  potassium citrate (UROCIT-K) 10 MEQ (1080 MG) SR tablet Take 10 mEq by mouth 4 (four) times daily.   Yes Historical Provider, MD  SYNTHROID 25 MCG tablet Take 25 mcg by mouth daily. 09/27/14  Yes Historical Provider, MD   BP 120/86 mmHg  Pulse 135  Temp(Src) 100.4 F (38 C) (Oral)  Resp 23  Ht 5\' 4"  (1.626 m)  Wt 261 lb 11.2 oz (118.706 kg)  BMI 44.90 kg/m2  LMP 10/22/2014 (Exact Date) Physical Exam  Constitutional: She is oriented to person, place, and time. She appears well-developed and well-nourished.  HENT:  Head: Normocephalic and atraumatic.  Right Ear: External ear normal.  Left Ear: External ear normal.  Mouth/Throat: Oropharynx is clear and moist.  Neck: Neck supple.  Cardiovascular: Regular rhythm.   No murmur heard.  tachycardic  Pulmonary/Chest: Effort normal and breath sounds normal. No respiratory distress.  Abdominal: Soft. There is no tenderness. There is no rebound and no guarding.  Musculoskeletal: She exhibits no edema or tenderness.  Neurological: She is alert and oriented to person, place, and time.  Skin: Skin is warm and dry.  Psychiatric: She has a normal mood and affect. Her behavior is normal.  Nursing note and vitals reviewed.   ED Course  Procedures (including critical care time) Labs Review Labs Reviewed  COMPREHENSIVE METABOLIC PANEL - Abnormal; Notable for the following:    Sodium 129 (*)    Chloride 94 (*)    Glucose, Bld 255 (*)    Calcium 8.7 (*)    Albumin 3.1 (*)    All other components within normal limits  URINALYSIS, ROUTINE W REFLEX MICROSCOPIC (NOT AT [facility]) -  Abnormal; Notable for the following:    Glucose, UA 500 (*)    Hgb urine dipstick SMALL (*)    Protein, ur >300 (*)    All other components within normal limits  RAPID STREP SCREEN (NOT AT [facility])  CULTURE, GROUP A STREP  CBC WITH DIFFERENTIAL/PLATELET  URINE MICROSCOPIC-ADD ON  POC URINE PREG, ED    Imaging Review Dg Chest 2 View  10/28/2014   CLINICAL DATA:  Cough and congestion with fever for 1 day  EXAM: CHEST  2 VIEW  COMPARISON:  None.  FINDINGS: Lungs are clear. Heart size and pulmonary vascularity are normal. No adenopathy. No bone lesions.  IMPRESSION: No abnormality noted.   Electronically Signed   By: Lowella Grip III M.D.   On: 10/28/2014 21:34     EKG Interpretation None      MDM   Final diagnoses:  Acute URI  Hyponatremia    patient here for evaluation of fever, cough, nasal congestion. Patient without respiratory distress in the emergency department. No evidence of acute pneumonia, otitis media, sinusitis , bacterial infection. She does have some hyponatremia , likely secondary to dehydration as well as hyperglycemia, provided IV fluid hydration. Discussed home care for viral URI with oral fluid hydration , PCP follow-up, close return precautions.   Quintella Reichert, MD 10/29/14 [phone]

## 2014-10-28 NOTE — ED Notes (Signed)
Unable to provide urine sample

## 2014-10-28 NOTE — ED Notes (Addendum)
Patient woke up yesterday with post nasal drip and sore throat.  She states that she was coughing and started having a fever yesterday and getting worse.  Patient is achy all over.

## 2014-10-29 MED ORDER — HYDROCOD POLST-CPM POLST ER 10-8 MG/5ML PO SUER: 5.0000 mL | Freq: Two times a day (BID) | ORAL | Status: DC | PRN

## 2014-10-29 NOTE — Discharge Instructions (Signed)
Upper Respiratory Infection, Adult An upper respiratory infection (URI) is also sometimes known as the common cold. The upper respiratory tract includes the nose, sinuses, throat, trachea, and bronchi. Bronchi are the airways leading to the lungs. Most people improve within 1 week, but symptoms can last up to 2 weeks. A residual cough may last even longer.  CAUSES Many different viruses can infect the tissues lining the upper respiratory tract. The tissues become irritated and inflamed and often become very moist. Mucus production is also common. A cold is contagious. You can easily spread the virus to others by oral contact. This includes kissing, sharing a glass, coughing, or sneezing. Touching your mouth or nose and then touching a surface, which is then touched by another person, can also spread the virus. SYMPTOMS  Symptoms typically develop 1 to 3 days after you come in contact with a cold virus. Symptoms vary from person to person. They may include:  Runny nose.  Sneezing.  Nasal congestion.  Sinus irritation.  Sore throat.  Loss of voice (laryngitis).  Cough.  Fatigue.  Muscle aches.  Loss of appetite.  Headache.  Low-grade fever. DIAGNOSIS  You might diagnose your own cold based on familiar symptoms, since most people get a cold 2 to 3 times a year. Your caregiver can confirm this based on your exam. Most importantly, your caregiver can check that your symptoms are not due to another disease such as strep throat, sinusitis, pneumonia, asthma, or epiglottitis. Blood tests, throat tests, and X-rays are not necessary to diagnose a common cold, but they may sometimes be helpful in excluding other more serious diseases. Your caregiver will decide if any further tests are required. RISKS AND COMPLICATIONS  You may be at risk for a more severe case of the common cold if you smoke cigarettes, have chronic heart disease (such as heart failure) or lung disease (such as asthma), or if  you have a weakened immune system. The very young and very old are also at risk for more serious infections. Bacterial sinusitis, middle ear infections, and bacterial pneumonia can complicate the common cold. The common cold can worsen asthma and chronic obstructive pulmonary disease (COPD). Sometimes, these complications can require emergency medical care and may be life-threatening. PREVENTION  The best way to protect against getting a cold is to practice good hygiene. Avoid oral or hand contact with people with cold symptoms. Wash your hands often if contact occurs. There is no clear evidence that vitamin C, vitamin E, echinacea, or exercise reduces the chance of developing a cold. However, it is always recommended to get plenty of rest and practice good nutrition. TREATMENT  Treatment is directed at relieving symptoms. There is no cure. Antibiotics are not effective, because the infection is caused by a virus, not by bacteria. Treatment may include:  Increased fluid intake. Sports drinks offer valuable electrolytes, sugars, and fluids.  Breathing heated mist or steam (vaporizer or shower).  Eating chicken soup or other clear broths, and maintaining good nutrition.  Getting plenty of rest.  Using gargles or lozenges for comfort.  Controlling fevers with ibuprofen or acetaminophen as directed by your caregiver.  Increasing usage of your inhaler if you have asthma. Zinc gel and zinc lozenges, taken in the first 24 hours of the common cold, can shorten the duration and lessen the severity of symptoms. Pain medicines may help with fever, muscle aches, and throat pain. A variety of non-prescription medicines are available to treat congestion and runny nose. Your caregiver   can make recommendations and may suggest nasal or lung inhalers for other symptoms.  HOME CARE INSTRUCTIONS   Only take over-the-counter or prescription medicines for pain, discomfort, or fever as directed by your  caregiver.  Use a warm mist humidifier or inhale steam from a shower to increase air moisture. This may keep secretions moist and make it easier to breathe.  Drink enough water and fluids to keep your urine clear or pale yellow.  Rest as needed.  Return to work when your temperature has returned to normal or as your caregiver advises. You may need to stay home longer to avoid infecting others. You can also use a face mask and careful hand washing to prevent spread of the virus. SEEK MEDICAL CARE IF:   After the first few days, you feel you are getting worse rather than better.  You need your caregiver's advice about medicines to control symptoms.  You develop chills, worsening shortness of breath, or brown or red sputum. These may be signs of pneumonia.  You develop yellow or brown nasal discharge or pain in the face, especially when you bend forward. These may be signs of sinusitis.  You develop a fever, swollen neck glands, pain with swallowing, or white areas in the back of your throat. These may be signs of strep throat. SEEK IMMEDIATE MEDICAL CARE IF:   You have a fever.  You develop severe or persistent headache, ear pain, sinus pain, or chest pain.  You develop wheezing, a prolonged cough, cough up blood, or have a change in your usual mucus (if you have chronic lung disease).  You develop sore muscles or a stiff neck. Document Released: 09/16/2000 Document Revised: 06/15/2011 Document Reviewed: 06/28/2013 [employer] Patient Information 2015 [employer]. This information is not intended to replace advice given to you by your health care provider. Make sure you discuss any questions you have with your health care provider.  

## 2014-11-01 LAB — CULTURE, GROUP A STREP

## 2014-11-02 ENCOUNTER — Ambulatory Visit: Payer: BLUE CROSS/BLUE SHIELD | Admitting: Urology

## 2014-12-08 ENCOUNTER — Emergency Department (HOSPITAL_COMMUNITY)
Admission: EM | Admit: 2014-12-08 | Discharge: 2014-12-08 | Disposition: A | Discharge status: Home / Self Care | Payer: BLUE CROSS/BLUE SHIELD | Attending: Emergency Medicine | Admitting: Emergency Medicine

## 2014-12-08 ENCOUNTER — Encounter (HOSPITAL_COMMUNITY): Payer: Self-pay | Admitting: *Deleted

## 2014-12-08 ENCOUNTER — Emergency Department (HOSPITAL_COMMUNITY): Payer: BLUE CROSS/BLUE SHIELD

## 2014-12-08 DIAGNOSIS — Z79899 Other long term (current) drug therapy: Secondary | ICD-10-CM | POA: Diagnosis not present

## 2014-12-08 DIAGNOSIS — M79672 Pain in left foot: Secondary | ICD-10-CM | POA: Diagnosis not present

## 2014-12-08 DIAGNOSIS — E119 Type 2 diabetes mellitus without complications: Secondary | ICD-10-CM | POA: Insufficient documentation

## 2014-12-08 DIAGNOSIS — E039 Hypothyroidism, unspecified: Secondary | ICD-10-CM | POA: Insufficient documentation

## 2014-12-08 DIAGNOSIS — Z87442 Personal history of urinary calculi: Secondary | ICD-10-CM | POA: Diagnosis not present

## 2014-12-08 DIAGNOSIS — E669 Obesity, unspecified: Secondary | ICD-10-CM | POA: Diagnosis not present

## 2014-12-08 DIAGNOSIS — Z794 Long term (current) use of insulin: Secondary | ICD-10-CM | POA: Insufficient documentation

## 2014-12-08 MED ORDER — IBUPROFEN 400 MG PO TABS
800.0000 mg | ORAL_TABLET | Freq: Once | ORAL | Status: AC
  Administered 2014-12-08: 800 mg via ORAL
  Filled 2014-12-08: qty 2

## 2014-12-08 MED ORDER — INDOMETHACIN 50 MG PO CAPS: 50.0000 mg | ORAL_CAPSULE | Freq: Three times a day (TID) | ORAL | Status: DC

## 2014-12-08 NOTE — Discharge Instructions (Signed)

## 2014-12-08 NOTE — ED Notes (Signed)
PT reports waking up with LT foot pain ,no known cause.

## 2014-12-08 NOTE — ED Notes (Signed)
Declined W/C at D/C and was escorted to lobby by RN. 

## 2014-12-08 NOTE — ED Provider Notes (Signed)
CSN: [id number]     Arrival date & time 12/08/14  0925 History  This chart was scribed for non-physician practitioner, Delsa Grana, PA-C, working with Orlie Dakin, MD, by Stephania Fragmin, ED Scribe. This patient was seen in room TR06C/TR06C and the patient's care was started at 9:48 AM.    Chief Complaint  Patient presents with  . Foot Pain   The history is provided by the patient. No language interpreter was used.    HPI Comments: Alison Fitzgerald is a 34 y.o. female who presents to the Emergency Department complaining of constant, severe, throbbing pain at the top of her left foot, that began when she noticed upon waking up this morning. She denies any known trauma or injury, stating that she was last normal last night. She also denies any recent changes in activity. Bearing weight or pointing her toes exacerbates her pain. Patient had taken Excedrin Migraine with no relief. She had also tried lying down and elevating it with no relief. Patient notes a history of gout in her left great toe at one point, but states this feels different; she reports no other instances of gout and is not currently taking any gout medications. She was given a steroid pack in the past as treatment for gout, but was unable to take it at that time due to her history of kidney problems. She denies numbness, tingling, swelling, redness, fever, chills, sweats, or any recent illness or change to diet.  She was at a revival yesterday and walked much more than she normally does.  She had no pain or sx prior to this am. She reports she had a cysteine staghorn kidney stone in the past.     Past Medical History  Diagnosis Date  . Diabetes mellitus without complication   . Hypothyroidism   . Kidney stones   . Staghorn kidney stones     right nephrectomy    Past Surgical History  Procedure Laterality Date  . Nephrectomy     Family History  Problem Relation Age of Onset  . Ovarian cancer Paternal Grandmother   .  Diabetes Maternal Grandmother   . Diabetes Mother    Social History  Substance Use Topics  . Smoking status: Never Smoker   . Smokeless tobacco: Never Used  . Alcohol Use: No   OB History    No data available     Review of Systems  Musculoskeletal: Positive for myalgias (left foot pain) and arthralgias.  Neurological: Negative for numbness.    Allergies  Doxycycline; Toradol; and Food  Home Medications   Prior to Admission medications   Medication Sig Start Date End Date Taking? Authorizing Provider  chlorpheniramine-HYDROcodone (TUSSIONEX PENNKINETIC ER) 10-8 MG/5ML SUER Take 5 mLs by mouth every 12 (twelve) hours as needed for cough. 10/29/14   Quintella Reichert, MD  insulin regular (NOVOLIN R,HUMULIN R) 100 units/mL injection Inject 5 Units into the skin 3 (three) times daily before meals.    Historical Provider, MD  LANTUS SOLOSTAR 100 UNIT/ML Solostar Pen Inject 10 Units into the skin at bedtime. 09/27/14   Historical Provider, MD  lisinopril (PRINIVIL,ZESTRIL) 10 MG tablet Take 10 mg by mouth daily. 09/27/14   Historical Provider, MD  metFORMIN (GLUCOPHAGE-XR) 500 MG 24 hr tablet Take 500 mg by mouth 2 (two) times daily.    Historical Provider, MD  potassium citrate (UROCIT-K) 10 MEQ (1080 MG) SR tablet Take 10 mEq by mouth 4 (four) times daily.    Historical Provider, MD  Wilmer Floor  25 MCG tablet Take 25 mcg by mouth daily. 09/27/14   Historical Provider, MD   BP 111/91 mmHg  Pulse 105  Temp(Src) 98.2 F (36.8 C) (Oral)  Resp 18  Ht 5\' 4"  (1.626 m)  Wt 261 lb (118.389 kg)  BMI 44.78 kg/m2  SpO2 98% Physical Exam  Constitutional: She is oriented to person, place, and time. She appears well-developed and well-nourished. No distress.  Obese female, appears stated age, NAD, well-appearing  HENT:  Head: Normocephalic and atraumatic.  Right Ear: External ear normal.  Left Ear: External ear normal.  Nose: Nose normal.  Mouth/Throat: Oropharynx is clear and moist. No  oropharyngeal exudate.  Eyes: Conjunctivae and EOM are normal. Pupils are equal, round, and reactive to light. Right eye exhibits no discharge. Left eye exhibits no discharge. No scleral icterus.  Neck: Normal range of motion. Neck supple. No JVD present. No tracheal deviation present.  Cardiovascular: Normal rate and regular rhythm.   Pulmonary/Chest: Effort normal and breath sounds normal. No stridor. No respiratory distress.  Musculoskeletal: Normal range of motion. She exhibits tenderness. She exhibits no edema.  Left foot: TTP to dorsum of the foot over 3rd Mt to lateral ankle.  normal flexion and extension of all toes. Cap refill <2 seconds. Sensation intact. Normal ankle eversion, inversion, flexion, and extension.  No visible swelling, redness, no palpated crepitus, fluctuance or calor.    Lymphadenopathy:    She has no cervical adenopathy.  Neurological: She is alert and oriented to person, place, and time. She exhibits normal muscle tone.  Antalgic gait  Skin: Skin is warm and dry. No rash noted. She is not diaphoretic. No erythema. No pallor.  Psychiatric: She has a normal mood and affect. Her behavior is normal. Judgment and thought content normal.  Nursing note and vitals reviewed.   ED Course  Procedures (including critical care time)  DIAGNOSTIC STUDIES: Oxygen Saturation is 98% on RA, normal by my interpretation.    COORDINATION OF CARE: 9:55 AM - Discussed treatment plan with pt at bedside which includes ibuprofen, ice, and left foot XR. Pt verbalized understanding and agreed to plan.   Imaging Review Dg Foot Complete Left  12/08/2014   CLINICAL DATA:  34 year old female with left foot pain and swelling this morning. No known history of injury.  EXAM: LEFT FOOT - COMPLETE 3+ VIEW  COMPARISON:  No priors.  FINDINGS: Multiple views of the left foot demonstrate no acute displaced fracture, subluxation, dislocation, or soft tissue abnormality. No destructive osseous lesions.   IMPRESSION: No acute radiographic abnormality of the left foot.   Electronically Signed   By: Vinnie Langton M.D.   On: 12/08/2014 10:30   I have personally reviewed and evaluated these images and lab results as part of my medical decision-making.  MDM   Final diagnoses:  None   Pt with left foot pain, onset this am without trauma Xray negative Sx sound suspicious for gout, but clinical presentation does not correlate.  RICE tx discussed.  Pt given ace wrap and crutches.  Since it is a holiday weekend, I have prescribed indomethacin for her to use if it begins to feel similar to her last gout flare.  She will w/up with her PCP on Tuesday.  I personally performed the services described in this documentation, which was scribed in my presence. The recorded information has been reviewed and is accurate.   Delsa Grana, PA-C 12/10/14 [place], MD 12/11/14 [phone]

## 2015-01-22 ENCOUNTER — Encounter: Payer: Self-pay | Admitting: Advanced Practice Midwife

## 2015-01-22 ENCOUNTER — Ambulatory Visit (INDEPENDENT_AMBULATORY_CARE_PROVIDER_SITE_OTHER): Payer: BLUE CROSS/BLUE SHIELD | Admitting: Advanced Practice Midwife

## 2015-01-22 VITALS — BP 118/84 | HR 104 | Ht 64.0 in | Wt 261.0 lb

## 2015-01-22 DIAGNOSIS — L02224 Furuncle of groin: Secondary | ICD-10-CM | POA: Diagnosis not present

## 2015-01-22 DIAGNOSIS — B373 Candidiasis of vulva and vagina: Secondary | ICD-10-CM

## 2015-01-22 MED ORDER — FLUCONAZOLE 150 MG PO TABS: ORAL_TABLET | ORAL | Status: DC

## 2015-01-22 MED ORDER — TRAMADOL HCL 50 MG PO TABS: 50.0000 mg | ORAL_TABLET | Freq: Four times a day (QID) | ORAL | Status: DC | PRN

## 2015-01-22 MED ORDER — CIPROFLOXACIN HCL 500 MG PO TABS: 500.0000 mg | ORAL_TABLET | Freq: Two times a day (BID) | ORAL | Status: DC

## 2015-01-22 NOTE — Patient Instructions (Signed)
Boil or furuncle is a skin disease caused by the inflammation of hair follicles, thus resulting in the localized accumulation of pus and dead tissues. Individual boils can cluster together and form an interconnected network of boils called carbuncles. In severe cases, boils may develop to form abscesses.  The organism causing boils is easily spread -- most caused by Staphylococcus aureus. Boils are just a description of what happend to infected skin. There are a variety of causes and many require prescription antibiotics, topical or oral to treat.  If they are boils then the puss from them will infect other parts of the leg. Boils can be caused by many things including hormones, illness, being run down etc. Usually, when a person keeps getting boils there is a bacterial problem and it may be systemic (inside your body).   The symptoms of boils are red, pus-filled lumps that are tender, warm, and/or painful. A yellow or white point at the center of the lump can be seen when the boil is ready to drain or discharge pus. In a severe infection, multiple boils may develop and the patient may experience fever and swollen lymph nodes. A recurring boil is called chronic furunculosis.  The primary treatment for most boils is heat application, usually with hot soaks or hot packs. Heat application increases the circulation to the area and allows the body to better fight off the infection by bringing antibodies and white blood cells to the site of infection.  Mild cases of folliculitis sometimes heal on their own. However, folliculitis may return or get worse. If your folliculitis spreads or returns, see your doctor for proper treatment. Deeper or more severe, you will need to take antibiotic pills. Your doctor may prescribe dicloxacillin, erythromycin, or cephalexin (such as Keflex). Ciprofloxacin (Cipro) and ofloxacin (such as Floxin) are used for certain types of bacteria.   On occasion, and especially with larger  boils, the the larger boil will need to be drained or "lanced" by a healthcare practitioner. Frequently, these larger boils contain several pockets of pus that must be opened and drained. The best thing to do is get a washcloth and wet it with the hottest water you can stand, put it on boil and keep it there untill cloth is cold. This will help bring it to a head. Once it comes to a head then you can lance it with a needle and get the puss out of it. You may need to use the heat more then once though.  There's an ointment called Boil-out, and there are several generic ointments called Drawing Salves that will get rid of them. Just apply a small amount of the ointment to the boil, cover it with a bandage and leave it on over night.  Soak it in luke warm salt water and slowly remove some of the cool water and add hot water, this will allow you to get the soak much hotter than if you just started with hot water. The boil should burst without much pain as the hot water relaxes the skin and the salt draws it out.

## 2015-01-22 NOTE — Progress Notes (Signed)
[facility] Visit  Patient name: Alison Fitzgerald MRN [id number]  Date of birth: Apr 08, 1980  CC & HPI:  Alison Fitzgerald is a 34 y.o.  female presenting today for Painful boils that popped up after she put monistat on groin. Had 10 days of tx for vaginal yeast, noticed itchy groin.   Pertinent History Reviewed:  Medical & Surgical Hx:   Past Medical History  Diagnosis Date  . Diabetes mellitus without complication ([place])   . Hypothyroidism   . Kidney stones   . Staghorn kidney stones     right nephrectomy    Past Surgical History  Procedure Laterality Date  . Nephrectomy     Family History  Problem Relation Age of Onset  . Ovarian cancer Paternal Grandmother   . Diabetes Maternal Grandmother   . Diabetes Mother     Current outpatient prescriptions:  .  insulin regular (NOVOLIN R,HUMULIN R) 100 units/mL injection, Inject 5 Units into the skin 3 (three) times daily before meals., Disp: , Rfl:  .  LANTUS SOLOSTAR 100 UNIT/ML Solostar Pen, Inject 10 Units into the skin at bedtime., Disp: , Rfl: 0 .  lisinopril (PRINIVIL,ZESTRIL) 10 MG tablet, Take 10 mg by mouth daily., Disp: , Rfl: 5 .  metFORMIN (GLUCOPHAGE-XR) 500 MG 24 hr tablet, Take 500 mg by mouth 2 (two) times daily., Disp: , Rfl:  .  potassium citrate (UROCIT-K) 10 MEQ (1080 MG) SR tablet, Take 10 mEq by mouth 4 (four) times daily., Disp: , Rfl:  .  SYNTHROID 25 MCG tablet, Take 25 mcg by mouth daily., Disp: , Rfl: 1 .  chlorpheniramine-HYDROcodone (TUSSIONEX PENNKINETIC ER) 10-8 MG/5ML SUER, Take 5 mLs by mouth every 12 (twelve) hours as needed for cough. (Patient not taking: Reported on 01/22/2015), Disp: 115 mL, Rfl: 0 .  indomethacin (INDOCIN) 50 MG capsule, Take 1 capsule (50 mg total) by mouth 3 (three) times daily with meals. May take up to 50mg  three times a day if no improvement with 25mg . (Patient not taking: Reported on 01/22/2015), Disp: 30 capsule, Rfl: 0 Social History: Reviewed -   reports that she has never smoked. She has never used smokeless tobacco.  Review of Systems:   Constitutional: Negative for fever and chills Eyes: Negative for visual disturbances Respiratory: Negative for shortness of breath, dyspnea Cardiovascular: Negative for chest pain or palpitations  Gastrointestinal: Negative for vomiting, diarrhea and constipation; no abdominal pain Genitourinary: Negative for dysuria and urgency, vaginal irritation or itching Musculoskeletal: Negative for back pain, joint pain, myalgias  Neurological: Negative for dizziness and headaches    Objective Findings:  Vitals: BP 118/84 mmHg  Pulse 104  Ht 5\' 4"  (1.626 m)  Wt 261 lb (118.389 kg)  BMI 44.78 kg/m2  LMP 01/02/2015 (Approximate)  Physical Examination: General appearance - alert, well appearing, and in no distress Mental status - alert, oriented to person, place, and time Heart - normal rate and regular rhythm Pelvic - SSE;  Vaginal red, thin white dc. Wep prep + WBC only. Several small furuncles, one in right groin extremely tender, can't walk.  Firm, not ready to pop. Others are pimple sized.   Musculoskeletal - full range of motion without pain Chest:  Normal respiratory effort  No results found for this or any previous visit (from the past 24 hour(s)).       Assessment & Plan:  A:   Skin Yeast, furuncles, leukorrhea P:  Vaginal/groin painted with gentian violet  rx cipro 500mg  BID X 10.  Warm compresses!  F/U prn  CRESENZO-DISHMAN,Arda Daggs CNM 01/22/2015 2:33 PM      7

## 2015-01-28 ENCOUNTER — Emergency Department (HOSPITAL_COMMUNITY)
Admission: EM | Admit: 2015-01-28 | Discharge: 2015-01-28 | Disposition: A | Discharge status: Home / Self Care | Payer: BLUE CROSS/BLUE SHIELD | Attending: Emergency Medicine | Admitting: Emergency Medicine

## 2015-01-28 ENCOUNTER — Emergency Department (HOSPITAL_COMMUNITY): Payer: BLUE CROSS/BLUE SHIELD

## 2015-01-28 ENCOUNTER — Encounter (HOSPITAL_COMMUNITY): Payer: Self-pay | Admitting: Cardiology

## 2015-01-28 DIAGNOSIS — Z87442 Personal history of urinary calculi: Secondary | ICD-10-CM | POA: Insufficient documentation

## 2015-01-28 DIAGNOSIS — E669 Obesity, unspecified: Secondary | ICD-10-CM | POA: Insufficient documentation

## 2015-01-28 DIAGNOSIS — R61 Generalized hyperhidrosis: Secondary | ICD-10-CM | POA: Diagnosis not present

## 2015-01-28 DIAGNOSIS — R000 Tachycardia, unspecified: Secondary | ICD-10-CM | POA: Insufficient documentation

## 2015-01-28 DIAGNOSIS — E119 Type 2 diabetes mellitus without complications: Secondary | ICD-10-CM | POA: Insufficient documentation

## 2015-01-28 DIAGNOSIS — R079 Chest pain, unspecified: Secondary | ICD-10-CM

## 2015-01-28 DIAGNOSIS — R110 Nausea: Secondary | ICD-10-CM | POA: Diagnosis not present

## 2015-01-28 DIAGNOSIS — R202 Paresthesia of skin: Secondary | ICD-10-CM | POA: Diagnosis not present

## 2015-01-28 DIAGNOSIS — Z794 Long term (current) use of insulin: Secondary | ICD-10-CM | POA: Diagnosis not present

## 2015-01-28 DIAGNOSIS — E039 Hypothyroidism, unspecified: Secondary | ICD-10-CM | POA: Diagnosis not present

## 2015-01-28 DIAGNOSIS — R42 Dizziness and giddiness: Secondary | ICD-10-CM | POA: Insufficient documentation

## 2015-01-28 DIAGNOSIS — Z79899 Other long term (current) drug therapy: Secondary | ICD-10-CM | POA: Insufficient documentation

## 2015-01-28 DIAGNOSIS — Z792 Long term (current) use of antibiotics: Secondary | ICD-10-CM | POA: Insufficient documentation

## 2015-01-28 DIAGNOSIS — R0602 Shortness of breath: Secondary | ICD-10-CM | POA: Diagnosis not present

## 2015-01-28 LAB — BASIC METABOLIC PANEL
ANION GAP: 11 (ref 5–15)
BUN: 12 mg/dL (ref 6–20)
CO2: 24 mmol/L (ref 22–32)
Calcium: 8.6 mg/dL — ABNORMAL LOW (ref 8.9–10.3)
Chloride: 101 mmol/L (ref 101–111)
Creatinine, Ser: 0.85 mg/dL (ref 0.44–1.00)
GFR calc Af Amer: >60 mL/min (ref >60)
GLUCOSE: 385 mg/dL — AB (ref 65–99)
POTASSIUM: 4.2 mmol/L (ref 3.5–5.1)
Sodium: 136 mmol/L (ref 135–145)

## 2015-01-28 LAB — CBC
HEMATOCRIT: 39.7 % (ref 36.0–46.0)
HEMOGLOBIN: 13.5 g/dL (ref 12.0–15.0)
MCH: 31.9 pg (ref 26.0–34.0)
MCHC: 34 g/dL (ref 30.0–36.0)
MCV: 93.9 fL (ref 78.0–100.0)
Platelets: 286 10*3/uL (ref 150–400)
RBC: 4.23 MIL/uL (ref 3.87–5.11)
RDW: 13.6 % (ref 11.5–15.5)
WBC: 7 10*3/uL (ref 4.0–10.5)

## 2015-01-28 LAB — I-STAT TROPONIN, ED: Troponin i, poc: 0 ng/mL (ref 0.00–0.08)

## 2015-01-28 MED ORDER — ONDANSETRON 4 MG PO TBDP: 4.0000 mg | ORAL_TABLET | Freq: Three times a day (TID) | ORAL | Status: DC | PRN

## 2015-01-28 MED ORDER — ONDANSETRON 4 MG PO TBDP: 4.0000 mg | ORAL_TABLET | Freq: Once | ORAL | Status: DC

## 2015-01-28 MED ORDER — ACETAMINOPHEN 500 MG PO TABS: 500.0000 mg | ORAL_TABLET | Freq: Four times a day (QID) | ORAL | Status: DC | PRN

## 2015-01-28 MED ORDER — ACETAMINOPHEN 325 MG PO TABS
650.0000 mg | ORAL_TABLET | Freq: Once | ORAL | Status: AC
  Administered 2015-01-28: 650 mg via ORAL
  Filled 2015-01-28: qty 2

## 2015-01-28 MED ORDER — ONDANSETRON HCL 4 MG/2ML IJ SOLN
4.0000 mg | INTRAMUSCULAR | Status: AC
  Administered 2015-01-28: 4 mg via INTRAVENOUS
  Filled 2015-01-28: qty 2

## 2015-01-28 NOTE — ED Notes (Signed)
Reports left sided chest pain that started last night and continued into this morning. Also feels light-headed like she is going to pass out.

## 2015-01-28 NOTE — Discharge Instructions (Signed)
1. Medications: tylenol, zofran, usual home medications 2. Treatment: rest, drink plenty of fluids 3. Follow Up: please followup with cardiology for discussion of your diagnoses and further evaluation after today's visit 4. Please return to the ER for increased chest pain or shortness of breath, persistent vomiting, high fever, new or worsening symptoms   Nonspecific Chest Pain  Chest pain can be caused by many different conditions. There is always a chance that your pain could be related to something serious, such as a heart attack or a blood clot in your lungs. Chest pain can also be caused by conditions that are not life-threatening. If you have chest pain, it is very important to follow up with your health care provider. CAUSES  Chest pain can be caused by:  Heartburn.  Pneumonia or bronchitis.  Anxiety or stress.  Inflammation around your heart (pericarditis) or lung (pleuritis or pleurisy).  A blood clot in your lung.  A collapsed lung (pneumothorax). It can develop suddenly on its own (spontaneous pneumothorax) or from trauma to the chest.  Shingles infection (varicella-zoster virus).  Heart attack.  Damage to the bones, muscles, and cartilage that make up your chest wall. This can include:  Bruised bones due to injury.  Strained muscles or cartilage due to frequent or repeated coughing or overwork.  Fracture to one or more ribs.  Sore cartilage due to inflammation (costochondritis). RISK FACTORS  Risk factors for chest pain may include:  Activities that increase your risk for trauma or injury to your chest.  Respiratory infections or conditions that cause frequent coughing.  Medical conditions or overeating that can cause heartburn.  Heart disease or family history of heart disease.  Conditions or health behaviors that increase your risk of developing a blood clot.  Having had chicken pox (varicella zoster). SIGNS AND SYMPTOMS Chest pain can feel  like:  Burning or tingling on the surface of your chest or deep in your chest.  Crushing, pressure, aching, or squeezing pain.  Dull or sharp pain that is worse when you move, cough, or take a deep breath.  Pain that is also felt in your back, neck, shoulder, or arm, or pain that spreads to any of these areas. Your chest pain may come and go, or it may stay constant. DIAGNOSIS Lab tests or other studies may be needed to find the cause of your pain. Your health care provider may have you take a test called an ambulatory ECG (electrocardiogram). An ECG records your heartbeat patterns at the time the test is performed. You may also have other tests, such as:  Transthoracic echocardiogram (TTE). During echocardiography, sound waves are used to create a picture of all of the heart structures and to look at how blood flows through your heart.  Transesophageal echocardiogram (TEE).This is a more advanced imaging test that obtains images from inside your body. It allows your health care provider to see your heart in finer detail.  Cardiac monitoring. This allows your health care provider to monitor your heart rate and rhythm in real time.  Holter monitor. This is a portable device that records your heartbeat and can help to diagnose abnormal heartbeats. It allows your health care provider to track your heart activity for several days, if needed.  Stress tests. These can be done through exercise or by taking medicine that makes your heart beat more quickly.  Blood tests.  Imaging tests. TREATMENT  Your treatment depends on what is causing your chest pain. Treatment may include:  Medicines.  These may include:  Acid blockers for heartburn.  Anti-inflammatory medicine.  Pain medicine for inflammatory conditions.  Antibiotic medicine, if an infection is present.  Medicines to dissolve blood clots.  Medicines to treat coronary artery disease.  Supportive care for conditions that do not  require medicines. This may include:  Resting.  Applying heat or cold packs to injured areas.  Limiting activities until pain decreases. HOME CARE INSTRUCTIONS  If you were prescribed an antibiotic medicine, finish it all even if you start to feel better.  Avoid any activities that bring on chest pain.  Do not use any tobacco products, including cigarettes, chewing tobacco, or electronic cigarettes. If you need help quitting, ask your health care provider.  Do not drink alcohol.  Take medicines only as directed by your health care provider.  Keep all follow-up visits as directed by your health care provider. This is important. This includes any further testing if your chest pain does not go away.  If heartburn is the cause for your chest pain, you may be told to keep your head raised (elevated) while sleeping. This reduces the chance that acid will go from your stomach into your esophagus.  Make lifestyle changes as directed by your health care provider. These may include:  Getting regular exercise. Ask your health care provider to suggest some activities that are safe for you.  Eating a heart-healthy diet. A registered dietitian can help you to learn healthy eating options.  Maintaining a healthy weight.  Managing diabetes, if necessary.  Reducing stress. SEEK MEDICAL CARE IF:  Your chest pain does not go away after treatment.  You have a rash with blisters on your chest.  You have a fever. SEEK IMMEDIATE MEDICAL CARE IF:   Your chest pain is worse.  You have an increasing cough, or you cough up blood.  You have severe abdominal pain.  You have severe weakness.  You faint.  You have chills.  You have sudden, unexplained chest discomfort.  You have sudden, unexplained discomfort in your arms, back, neck, or jaw.  You have shortness of breath at any time.  You suddenly start to sweat, or your skin gets clammy.  You feel nauseous or you vomit.  You  suddenly feel light-headed or dizzy.  Your heart begins to beat quickly, or it feels like it is skipping beats. These symptoms may represent a serious problem that is an emergency. Do not wait to see if the symptoms will go away. Get medical help right away. Call your local emergency services (911 in the [place] Do not drive yourself to the hospital.   This information is not intended to replace advice given to you by your health care provider. Make sure you discuss any questions you have with your health care provider.   Document Released: 12/31/2004 Document Revised: 04/13/2014 Document Reviewed: 10/27/2013 Elsevier Interactive Patient Education 2016 [employer].   Emergency Department Resource Guide 1) Find a Doctor and Pay Out of Pocket Although you won't have to find out who is covered by your insurance plan, it is a good idea to ask around and get recommendations. You will then need to call the office and see if the doctor you have chosen will accept you as a new patient and what types of options they offer for patients who are self-pay. Some doctors offer discounts or will set up payment plans for their patients who do not have insurance, but you will need to ask so you aren't surprised when  you get to your appointment.  2) Contact Your Local Health Department Not all health departments have doctors that can see patients for sick visits, but many do, so it is worth a call to see if yours does. If you don't know where your local health department is, you can check in your phone book. The CDC also has a tool to help you locate your state's health department, and many state websites also have listings of all of their local health departments.  3) Find a [facility] If your illness is not likely to be very severe or complicated, you may want to try a walk in clinic. These are popping up all over the country in pharmacies, drugstores, and shopping centers. They're usually staffed by nurse  practitioners or physician assistants that have been trained to treat common illnesses and complaints. They're usually fairly quick and inexpensive. However, if you have serious medical issues or chronic medical problems, these are probably not your best option.  No Primary Care Doctor: - Call Health Connect at  [phone] - they can help you locate a primary care doctor that  accepts your insurance, provides certain services, etc. - Physician Referral Service- [phone]  Chronic Pain Problems: Organization         Address  Phone   Notes  [facility]  [phone] Patients need to be referred by their primary care doctor.   Medication Assistance: Organization         Address  Phone   Notes  [facility] Medication [facility] [address] [place], [place] [address] [phone] --Must be a resident of [facility] -- Must have NO insurance coverage whatsoever (no Medicaid/ Medicare, etc.) -- The pt. MUST have a primary care doctor that directs their care regularly and follows them in the community   MedAssist  [phone]   [employer]  [phone]    Agencies that provide inexpensive medical care: Organization         Address  Phone   Notes  [place]  [phone]   [facility]    [phone]   [facility] [place], [place] [address] [phone]   [place] [address] [place] [phone]   Planned Parenthood    [phone]   [facility]    [phone]   [place] and [place] [address], [place] Phone:  [phone], Fax:  [phone] Hours of Operation:  9 am - 6 pm, M-F.  Also accepts Medicaid/Medicare and self-pay.  [facility] for [place] [address], [address], [place] Phone: [phone], Fax: [phone]. Hours of Operation:  8:30 am -  5:30 pm, M-F.  Also accepts Medicaid and self-pay.  [facility] [place] [address] [place] Phone: [phone]   [place], [place], [place] [phone], Ext. [phone] Mondays & Thursdays: 7-9 AM.  First 15 patients are seen on a first come, first serve basis.    [place] Providers:  Organization         Address  Phone   Notes  [facility] [address],  [phone] Also accepts self-pay patients.  [place], [address]  [phone]   [place]  [place], [address], [place] [phone]   [place] [address] [place] [phone]   Lucianne Lei [address], [place]   [phone] Only accepts [place] Access [place] patients after they have their name applied to their card.   Self-Pay (no insurance) in [facility]:  Organization         Address  Phone   Notes  Sickle Cell Patients, [facility] [place] [phone]   [facility] [place] [phone]   [facility] [place]  [place], [place], [place] [phone]   [facility]/Dr. Osei-Bonsu  [address] [place] or [address], [place] [phone] Phone number for both [place] and [place] locations is the same.  [facility] [address] [place] [phone]   [facility] [address] [place] or [address] [phone]   [facility] [address] [place] [phone], phone; [phone], fax Sees patients 1st and 3rd Saturday of every month.  Must not qualify for public or private insurance (i.e. Medicaid, Medicare, [place] Health Choice,  Veterans' Benefits)  Household income should be no more than 200% of the poverty level The clinic cannot treat you if you are pregnant or think you are pregnant  Sexually transmitted diseases are not treated at the clinic.    Dental Care: Organization         Address  Phone  Notes  [facility] Department of [facility] [place] [phone] Accepts children up to age 48 who are enrolled in [place] or [place]; pregnant women with a Medicaid card; and children who have applied for Medicaid or [place] Health Choice, but were declined, whose parents can pay a reduced fee at time of service.  [facility] Department of [facility]  [address], [place] [phone] Accepts children up to age 40 who are enrolled in [place] or [place]; pregnant women with a Medicaid card; and children who have applied for Medicaid or [place] Health Choice, but were declined, whose parents can pay a reduced fee at time of service.  [place] Adult Dental Access PROGRAM  [place] [phone] Patients are seen by appointment only. Walk-ins are not accepted. [place] will see patients 12 years of age and older. Monday - Tuesday (8am-5pm) Most Wednesdays (8:30-5pm) $30 per visit, cash only  [facility] Adult Dental Access PROGRAM  [address], [facility] [phone] Patients are seen by appointment only. Walk-ins are not accepted. [place] will see patients 38 years of age and older. One Wednesday Evening (Monthly: Volunteer Based).  $30 per visit, cash only  [place]  [phone] for adults; Children under age 49, call Graduate Pediatric Dentistry at [phone]. Children aged 60-14, please call [phone] to request a pediatric application.  Dental services are provided in all areas of dental care including fillings, crowns and bridges, complete and  partial dentures, implants, gum treatment, root canals, and extractions. Preventive care is also provided. Treatment is provided to both adults and children. Patients are selected via a lottery and there is often a waiting list.   [phone]  Dental Clinic [address], [place]  [phone] [email or web address]   [facility] [address] [place], [place] [phone], Ext. [phone] Second and Fourth Thursday of each month, opens at 6:30 AM; Clinic ends at 9 AM.  Patients are seen on a first-come first-served basis, and a limited number are seen during each clinic.   [facility]  [address] Hillard Danker [place], [place] [phone]   Eligibility Requirements You must have lived in [place], [place], or [place] counties for at least the last three months.   You cannot be eligible for state or federal sponsored [employer], including [employer], [place], or [employer].   You generally cannot be eligible for healthcare insurance through your employer.    How to apply: Eligibility screenings are held every Tuesday and Wednesday afternoon from 1:00 pm until 4:00 pm. You do not need an appointment for the interview!  [facility] [address], [place], [place]   [place]  [place] Department  [place]  [phone]    Behavioral Health Resources in the Community: Intensive Outpatient Programs Organization         Address  Phone  Notes  [place]. [address] [place], [place] [phone]   [facility] [address], [place], [place]   ADS: Alcohol & Drug Svcs [address], [place], [place]   [place] [address]  [place], [place] or [phone]   Substance Abuse Resources Organization          Address  Phone  Notes  Alcohol and Drug Services  [phone]   [place]  [phone]   [facility] [place]   Chinita Pester  [phone]   Residential & Outpatient Substance Abuse Program  [phone]   Psychological Services Organization         Address  Phone  Notes  [facility] [place]  [place]  [phone]   [place] [address], [place] or [phone]    Mobile Crisis Teams Organization         Address  Phone  Notes  [facility], Mobile Crisis Care Unit  [phone]   [facility]otherapeutic [facility]  [address] [place], [place]   [place] [address] [place] [phone]    Self-Help/Support Groups Organization         Address  Phone             Notes  [place]. of [place] - variety of support groups  [place] Call for more information  Narcotics Anonymous (NA), [facility] [address], [employer] [place]  2 meetings at this location   Special educational needs teacher         Address  Phone  Notes  ASAP Residential Treatment [place],    [place]  [phone]   [facility]  [address], [place] [address], [place], [place]   [place], [place] [phone] Admissions: 8am-3pm M-F  Incentives Substance [place] [address],    [place], [place] [phone]   [facility] [address] [place], [place], [place]   The [place].,  [place], [place]   [facility] - Intensive Outpatient [address], [place], [place]   [facility] ([place] [address]  [place], [place] [phone] or [phone]   [facility] Services (RTS) [address], [place], [place] Accepts Medicaid  [facility] [place] [address],  [place] [phone] Substance Abuse/Addiction Treatment   [facility] Organization         Address  Phone  Notes  [facility]  [phone]   Domenic Schwab, PhD [address] [place]lis [place], [place]   [phone] or [phone]   [place], [place] [phone]   [facility] [address] [place], [place] [phone] Insurance/Medicaid/sponsorship through [facility] and Families [address], [address]                                    [place], [place] [phone] [place] [address], [place] [phone]    Dr. Adele Schilder  [phone]   [facility] Dept. 1) [address], [place] 2) [place] 3)  [address], [place] [phone]  [phone]   [place] [phone] or [phone] (After Hours)

## 2015-01-28 NOTE — ED Provider Notes (Signed)
CSN: [id number]     Arrival date & time 01/28/15  0810 History   First MD Initiated Contact with Patient 01/28/15 [phone]     Chief Complaint  Patient presents with  . Chest Pain     HPI   Alison Fitzgerald is a 34 y.o. female with a PMH of DM who presents to the ED with constant left sided chest pain, which started last night while sitting in church. She characterizes her pain as pressure, and states she sometimes experiences "sharp, stabbing" pains. She reports her pain radiates to her left shoulder. She denies exacerbating factors. She has tried motrin for symptom relief. She reports associated lightheadedness, diaphoresis, shortness of breath, and "tingling" in her left jaw. She states she was evaluated in [place] 7 years ago and underwent stress testing and cath, which she reports revealed she "does not have all of the arteries in her heart." She reports nausea. She denies fever, chills, cough, congestion, abdominal pain, vomiting, diarrhea, constipation, dysuria, urgency, frequency. She denies recent travel or immobility, recent surgery, estrogen use, history of DVT/PE.   Past Medical History  Diagnosis Date  . Diabetes mellitus without complication ([place])   . Hypothyroidism   . Kidney stones   . Staghorn kidney stones     right nephrectomy    Past Surgical History  Procedure Laterality Date  . Nephrectomy     Family History  Problem Relation Age of Onset  . Ovarian cancer Paternal Grandmother   . Diabetes Maternal Grandmother   . Diabetes Mother    Social History  Substance Use Topics  . Smoking status: Never Smoker   . Smokeless tobacco: Never Used  . Alcohol Use: No   OB History    No data available      Review of Systems  Constitutional: Positive for diaphoresis. Negative for fever and chills.  HENT: Negative for congestion.   Respiratory: Positive for shortness of breath. Negative for cough.   Cardiovascular: Positive for chest pain. Negative for  palpitations and leg swelling.  Gastrointestinal: Positive for nausea. Negative for vomiting, abdominal pain, diarrhea and constipation.  Genitourinary: Negative for dysuria, urgency and frequency.  Neurological: Positive for light-headedness. Negative for dizziness, syncope, weakness and headaches.  All other systems reviewed and are negative.     Allergies  Doxycycline; Toradol; Food; and Other  Home Medications   Prior to Admission medications   Medication Sig Start Date End Date Taking? Authorizing Provider  ciprofloxacin (CIPRO) 500 MG tablet Take 1 tablet (500 mg total) by mouth 2 (two) times daily. 01/22/15  Yes Christin Fudge, CNM  ibuprofen (ADVIL,MOTRIN) 200 MG tablet Take 400 mg by mouth every 6 (six) hours as needed for mild pain.   Yes Historical Provider, MD  insulin regular (NOVOLIN R,HUMULIN R) 100 units/mL injection Inject 5 Units into the skin 3 (three) times daily before meals.   Yes Historical Provider, MD  LANTUS SOLOSTAR 100 UNIT/ML Solostar Pen Inject 10 Units into the skin at bedtime. 09/27/14  Yes Historical Provider, MD  levothyroxine (SYNTHROID, LEVOTHROID) 200 MCG tablet Take 200 mcg by mouth daily before breakfast. Take with 8mcg to =27mcg   Yes Historical Provider, MD  lisinopril (PRINIVIL,ZESTRIL) 10 MG tablet Take 10 mg by mouth daily. 09/27/14  Yes Historical Provider, MD  metFORMIN (GLUCOPHAGE-XR) 500 MG 24 hr tablet Take 500 mg by mouth 2 (two) times daily.   Yes Historical Provider, MD  potassium citrate (UROCIT-K) 10 MEQ (1080 MG) SR tablet Take 10 mEq by mouth  4 (four) times daily.   Yes Historical Provider, MD  SYNTHROID 25 MCG tablet Take 25 mcg by mouth daily. Take with 268mcg to =234mcg 09/27/14  Yes Historical Provider, MD  acetaminophen (TYLENOL) 500 MG tablet Take 1 tablet (500 mg total) by mouth every 6 (six) hours as needed. 01/28/15   Marella Chimes, PA-C  indomethacin (INDOCIN) 50 MG capsule Take 1 capsule (50 mg total) by  mouth 3 (three) times daily with meals. May take up to 50mg  three times a day if no improvement with 25mg . Patient not taking: Reported on 01/22/2015 12/08/14   Delsa Grana, PA-C  ondansetron (ZOFRAN ODT) 4 MG disintegrating tablet Take 1 tablet (4 mg total) by mouth every 8 (eight) hours as needed for nausea. 01/28/15   Marella Chimes, PA-C  traMADol (ULTRAM) 50 MG tablet Take 1 tablet (50 mg total) by mouth every 6 (six) hours as needed. Patient not taking: Reported on 01/28/2015 01/22/15   Christin Fudge, CNM    BP 118/82 mmHg  Pulse 92  Temp(Src) 98.3 F (36.8 C) (Oral)  Resp 18  Ht 5\' 4"  (1.626 m)  Wt 260 lb (117.935 kg)  BMI 44.61 kg/m2  SpO2 98%  LMP 12/31/2014 Physical Exam  Constitutional: She is oriented to person, place, and time. She appears well-developed and well-nourished. No distress.  Obese female in no acute distress.  HENT:  Head: Normocephalic and atraumatic.  Right Ear: External ear normal.  Left Ear: External ear normal.  Nose: Nose normal.  Mouth/Throat: Uvula is midline, oropharynx is clear and moist and mucous membranes are normal.  Eyes: Conjunctivae, EOM and lids are normal. Pupils are equal, round, and reactive to light. Right eye exhibits no discharge. Left eye exhibits no discharge. No scleral icterus.  Neck: Normal range of motion. Neck supple.  Cardiovascular: Normal rate, regular rhythm, normal heart sounds, intact distal pulses and normal pulses.   Pulmonary/Chest: Effort normal and breath sounds normal. No respiratory distress. She has no wheezes. She has no rales. She exhibits no tenderness.  Abdominal: Soft. Normal appearance and bowel sounds are normal. She exhibits no distension and no mass. There is no tenderness. There is no rigidity, no rebound and no guarding.  Musculoskeletal: Normal range of motion. She exhibits no edema or tenderness.  Neurological: She is alert and oriented to person, place, and time.  Skin: Skin is warm,  dry and intact. No rash noted. She is not diaphoretic. No erythema. No pallor.  Psychiatric: She has a normal mood and affect. Her speech is normal and behavior is normal.  Nursing note and vitals reviewed.   ED Course  Procedures (including critical care time)  Labs Review Labs Reviewed  BASIC METABOLIC PANEL - Abnormal; Notable for the following:    Glucose, Bld 385 (*)    Calcium 8.6 (*)    All other components within normal limits  CBC  I-STAT TROPOININ, ED   Imaging Review Dg Chest 2 View  01/28/2015  CLINICAL DATA:  Dizziness, nausea and left-sided chest pain. EXAM: CHEST - 2 VIEW COMPARISON:  10/28/2014 FINDINGS: The heart size and mediastinal contours are within normal limits. There is no evidence of pulmonary edema, consolidation, pneumothorax, nodule or pleural fluid. The visualized skeletal structures are unremarkable. IMPRESSION: No active disease. Electronically Signed   By: Aletta Edouard M.D.   On: 01/28/2015 09:17     I have personally reviewed and evaluated these images and lab results as part of my medical decision-making.   EKG  Interpretation   Date/Time:  Monday January 28 2015 08:19:43 EDT Ventricular Rate:  101 PR Interval:  194 QRS Duration: 90 QT Interval:  366 QTC Calculation: 474 R Axis:   55 Text Interpretation:  Sinus tachycardia Low voltage QRS Borderline ECG  Sinus tachycardia Artifact Abnormal ekg Confirmed by Carmin Muskrat  MD  ([id number]) on 01/28/2015 9:40:30 AM      MDM   Final diagnoses:  Chest pain, unspecified chest pain type    34 year old female presents with constant left sided chest pain, which started yesterday. Reports associated lightheadedness, diaphoresis, shortness of breath, and "tingling" in her left jaw. States she was evaluated in [place] 7 years ago and underwent stress testing and cath, which revealed she does not have all of the arteries in her heart. Reports nausea. Denies fever, chills, cough, congestion, abdominal  pain, vomiting, diarrhea, constipation, dysuria, urgency, frequency, recent travel or immobility, recent surgery, estrogen use, history of DVT/PE.  Patient is afebrile. Mildy tachycardic. Heart regular rhythm. Lungs clear to auscultation bilaterally. Abdomen soft, nontender, nondistended. No lower extremity edema.  Pain treated with tylenol. Nausea treated with zofran.  EKG reveals sinus tachycardia HR 101, no acute ischemia. Troponin negative. CBC negative for leukocytosis or anemia, BMP unremarkable. Chest x-ray negative for pulmonary edema, consolidation, pneumothorax, nodule, or pleural fluid.  Given negative workup in the ED and duration of time since symptom onset, low suspicion for ACS. No risk factors for PE. HR improved to 90s. Cardiology consulted. Spoke with Dr. Martinique, who feels patient is stable for outpatient follow-up. Spoke with patient regarding plan, states she is in agreement. Return precautions discussed at length. Patient to follow-up with cardiology.  BP 118/82 mmHg  Pulse 92  Temp(Src) 98.3 F (36.8 C) (Oral)  Resp 18  Ht 5\' 4"  (1.626 m)  Wt 260 lb (117.935 kg)  BMI 44.61 kg/m2  SpO2 98%  LMP 12/31/2014        Marella Chimes, PA-C 01/28/15 1546  Carmin Muskrat, MD 01/29/15 1718

## 2015-01-29 ENCOUNTER — Encounter: Payer: Self-pay | Admitting: Endocrinology

## 2015-01-29 ENCOUNTER — Ambulatory Visit (INDEPENDENT_AMBULATORY_CARE_PROVIDER_SITE_OTHER): Payer: BLUE CROSS/BLUE SHIELD | Admitting: Endocrinology

## 2015-01-29 VITALS — BP 134/87 | HR 88 | Temp 98.2°F | Ht 64.0 in | Wt 260.0 lb

## 2015-01-29 DIAGNOSIS — E282 Polycystic ovarian syndrome: Secondary | ICD-10-CM | POA: Diagnosis not present

## 2015-01-29 DIAGNOSIS — Z23 Encounter for immunization: Secondary | ICD-10-CM

## 2015-01-29 DIAGNOSIS — E785 Hyperlipidemia, unspecified: Secondary | ICD-10-CM | POA: Diagnosis not present

## 2015-01-29 MED ORDER — GLUCOSE BLOOD VI STRP: 1.0000 | ORAL_STRIP | Freq: Two times a day (BID) | Status: DC

## 2015-01-29 MED ORDER — INSULIN PEN NEEDLE 32G X 4 MM MISC: 1.0000 | Freq: Every day | Status: DC

## 2015-01-29 MED ORDER — LEVOTHYROXINE SODIUM 112 MCG PO TABS: ORAL_TABLET | ORAL | Status: DC

## 2015-01-29 MED ORDER — "INSULIN SYRINGE-NEEDLE U-100 31G X 5/16"" 0.3 ML MISC": 1.0000 | Freq: Three times a day (TID) | Status: DC

## 2015-01-29 MED ORDER — INSULIN GLARGINE 100 UNIT/ML SOLOSTAR PEN: 10.0000 [IU] | PEN_INJECTOR | Freq: Every day | SUBCUTANEOUS | Status: DC

## 2015-01-29 MED ORDER — INSULIN REGULAR HUMAN 100 UNIT/ML IJ SOLN: 10.0000 [IU] | Freq: Three times a day (TID) | INTRAMUSCULAR | Status: DC

## 2015-01-29 NOTE — Patient Instructions (Addendum)
good diet and exercise significantly improve the control of your diabetes.  please let me know if you wish to be referred to a dietician.  high blood sugar is very risky to your health.  you should see an eye doctor and dentist every year.  It is very important to get all recommended vaccinations.  controlling your blood pressure and cholesterol drastically reduces the damage diabetes does to your body.  Those who smoke should quit.  please discuss these with your doctor.  Please consider having weight loss surgery.  It is good for your health.  Here is some information about it.  If you decide to consider further, please call the phone number in the papers, and register for a free informational meeting For now:  Please continue the same lantus, and: Increase the regular to 10 units 3 times a day (just before each meal) Please come back for a follow-up appointment in 2 months.

## 2015-01-29 NOTE — Progress Notes (Signed)
Subjective:    Patient ID: Mackie Pai, female    DOB: 1981-01-13, 34 y.o.   MRN: [id number]  HPI pt states DM was dx'ed in 2012 (she had GDM in 2007); she has mild if any neuropathy of the lower extremities; he is unaware of any associated chronic complications; she has been on insulin since dx; pt says her diet and exercise are not good; she has never had severe hypoglycemia or DKA.  She has had 3 episodes of pancreatitis (most recent was 2015; cause was undetermined).  She has had TL.  She says cbg's are in the 200's.  There is no trend throughout the day.   Past Medical History  Diagnosis Date  . Diabetes mellitus without complication ([place])   . Hypothyroidism   . Kidney stones   . Staghorn kidney stones     right nephrectomy     Past Surgical History  Procedure Laterality Date  . Nephrectomy      Social History   Social History  . Marital Status: Married    Spouse Name: N/A  . Number of Children: N/A  . Years of Education: N/A   Occupational History  . Not on file.   Social History Main Topics  . Smoking status: Never Smoker   . Smokeless tobacco: Never Used  . Alcohol Use: No  . Drug Use: No  . Sexual Activity: Yes    Birth Control/ Protection: Surgical   Other Topics Concern  . Not on file   Social History Narrative    Current Outpatient Prescriptions on File Prior to Visit  Medication Sig Dispense Refill  . acetaminophen (TYLENOL) 500 MG tablet Take 1 tablet (500 mg total) by mouth every 6 (six) hours as needed. 30 tablet 0  . ciprofloxacin (CIPRO) 500 MG tablet Take 1 tablet (500 mg total) by mouth 2 (two) times daily. 20 tablet 0  . ibuprofen (ADVIL,MOTRIN) 200 MG tablet Take 400 mg by mouth every 6 (six) hours as needed for mild pain.    . indomethacin (INDOCIN) 50 MG capsule Take 1 capsule (50 mg total) by mouth 3 (three) times daily with meals. May take up to 50mg  three times a day if no improvement with 25mg . 30 capsule 0  . lisinopril  (PRINIVIL,ZESTRIL) 10 MG tablet Take 10 mg by mouth daily.  5  . metFORMIN (GLUCOPHAGE-XR) 500 MG 24 hr tablet Take 500 mg by mouth 2 (two) times daily.    . potassium citrate (UROCIT-K) 10 MEQ (1080 MG) SR tablet Take 10 mEq by mouth 4 (four) times daily.    . traMADol (ULTRAM) 50 MG tablet Take 1 tablet (50 mg total) by mouth every 6 (six) hours as needed. (Patient not taking: Reported on 01/28/2015) 20 tablet 0   No current facility-administered medications on file prior to visit.    Allergies  Allergen Reactions  . Doxycycline Anaphylaxis  . Toradol [Ketorolac Tromethamine] Hives  . Food Swelling    Jalapeno seeds  . Other     thyola- bottomed out blood pressure    Family History  Problem Relation Age of Onset  . Ovarian cancer Paternal Grandmother   . Diabetes Maternal Grandmother   . Diabetes Mother     BP 134/87 mmHg  Pulse 88  Temp(Src) 98.2 F (36.8 C) (Oral)  Ht 5\' 4"  (1.626 m)  Wt 260 lb (117.935 kg)  BMI 44.61 kg/m2  SpO2 96%  LMP 12/31/2014    Review of Systems denies blurry vision, chest pain,  sob, n/v, urinary frequency, muscle cramps, excessive diaphoresis, depression, cold intolerance, rhinorrhea, and easy bruising.  She has intermittent headache.  She has chronic weight gain.      Objective:   Physical Exam VS: see vs page GEN: no distress HEAD: head: no deformity eyes: no periorbital swelling, no proptosis external nose and ears are normal mouth: no lesion seen NECK: supple, thyroid is not enlarged CHEST WALL: no deformity LUNGS:  Clear to auscultation CV: reg rate and rhythm, no murmur ABD: abdomen is soft, nontender.  no hepatosplenomegaly.  not distended.  no hernia MUSCULOSKELETAL: muscle bulk and strength are grossly normal.  no obvious joint swelling.  gait is normal and steady EXTEMITIES: no deformity.  no ulcer on the feet.  feet are of normal color and temp.  no edema PULSES: dorsalis pedis intact bilat.  no carotid bruit NEURO:  cn  2-12 grossly intact.   readily moves all 4's.  sensation is intact to touch on the feet SKIN:  Normal texture and temperature.  No rash or suspicious lesion is visible.   NODES:  None palpable at the neck PSYCH: alert, well-oriented.  Does not appear anxious nor depressed.   I have reviewed outside records, and summarized: Pt was noted to have poorly-controlled DM, and ref here  i personally reviewed electrocardiogram tracing (01/28/15): Indication: DM Impression: sinus tachycardia.    outside test results are reviewed: A1c=10.6    Assessment & Plan:  DM: severe exacerbation.    Patient is advised the following: Patient Instructions  good diet and exercise significantly improve the control of your diabetes.  please let me know if you wish to be referred to a dietician.  high blood sugar is very risky to your health.  you should see an eye doctor and dentist every year.  It is very important to get all recommended vaccinations.  controlling your blood pressure and cholesterol drastically reduces the damage diabetes does to your body.  Those who smoke should quit.  please discuss these with your doctor.  Please consider having weight loss surgery.  It is good for your health.  Here is some information about it.  If you decide to consider further, please call the phone number in the papers, and register for a free informational meeting For now:  Please continue the same lantus, and: Increase the regular to 10 units 3 times a day (just before each meal) Please come back for a follow-up appointment in 2 months.

## 2015-02-15 ENCOUNTER — Encounter: Payer: Self-pay | Admitting: Endocrinology

## 2015-02-15 ENCOUNTER — Telehealth: Payer: Self-pay | Admitting: Endocrinology

## 2015-02-15 NOTE — Telephone Encounter (Signed)
please call patient: i got your info.  The letter is done, and is on the computer system

## 2015-02-15 NOTE — Telephone Encounter (Signed)
I contacted the pt and advised of note below. The pt voiced understanding.

## 2015-02-18 ENCOUNTER — Encounter: Payer: Self-pay | Admitting: Cardiovascular Disease

## 2015-02-18 ENCOUNTER — Ambulatory Visit (INDEPENDENT_AMBULATORY_CARE_PROVIDER_SITE_OTHER): Payer: BLUE CROSS/BLUE SHIELD | Admitting: Cardiovascular Disease

## 2015-02-18 ENCOUNTER — Telehealth: Payer: Self-pay | Admitting: Cardiovascular Disease

## 2015-02-18 VITALS — BP 120/80 | HR 101 | Ht 64.0 in | Wt 262.0 lb

## 2015-02-18 DIAGNOSIS — I471 Supraventricular tachycardia, unspecified: Secondary | ICD-10-CM | POA: Insufficient documentation

## 2015-02-18 MED ORDER — METOPROLOL TARTRATE 25 MG PO TABS: 25.0000 mg | ORAL_TABLET | Freq: Two times a day (BID) | ORAL | Status: DC

## 2015-02-18 MED ORDER — PROPRANOLOL HCL 10 MG PO TABS: 10.0000 mg | ORAL_TABLET | Freq: Four times a day (QID) | ORAL | Status: DC | PRN

## 2015-02-18 NOTE — Progress Notes (Signed)
Cardiology Office Note   Date:  02/18/2015   ID:  Alison Fitzgerald, DOB 21-Oct-1980, MRN [id number]  PCP:  Gara Kroner, MD  Cardiologist:   Thayer Headings, MD   Chief Complaint  Patient presents with  . Palpitations   Problem list 1. Palpitations 2. Diabetes mellitus 3. PCL S 4. Hypothyroidism 5. Congential coronary artery disease:   Essentially Single coronary artery with a small posterior LCx by patients report.    History of Present Illness: Alison Fitzgerald is a 34 y.o. female who presents for palpitation .  Has a hx of congenital Coronary artery disease.   Had a cath in 2009. Was essentially found to have a single coronary artery .    Has been having some chest pain / dyspnea  Severe CP recently ,  Pain comes and goes.   Has palpitations that wake her up at night . Associated with some dyspnea  Has had some tachypalps - a co-worker measured her HR at 201 several weeks ago  That episode was associated with lightheadedness and chest pressure.   Episodes can last as long as 1 1/2 hours.  Typically last 45 min - 1 hour.    Recent glucose levels are poorly controlled.   - HbA1C > 10. Does not exercise Works 2 jobs - works for [employer] and also works at [employer] .   Past Medical History  Diagnosis Date  . Diabetes mellitus without complication ([place])   . Hypothyroidism   . Kidney stones   . Staghorn kidney stones     right nephrectomy     Past Surgical History  Procedure Laterality Date  . Nephrectomy       Current Outpatient Prescriptions  Medication Sig Dispense Refill  . glucose blood test strip 1 each by Other route 2 (two) times daily. And lancets 2/day 100 each 12  . ibuprofen (ADVIL,MOTRIN) 200 MG tablet Take 400 mg by mouth every 6 (six) hours as needed for mild pain.    . Insulin Glargine (LANTUS SOLOSTAR) 100 UNIT/ML Solostar Pen Inject 10 Units into the skin at bedtime. 15 mL 0  . Insulin Pen Needle 32G X 4 MM MISC 1 Device by  Does not apply route daily. 50 each 11  . insulin regular (NOVOLIN R,HUMULIN R) 100 units/mL injection Inject 0.1 mLs (10 Units total) into the skin 3 (three) times daily before meals. 30 mL 3  . Insulin Syringe-Needle U-100 31G X 5/16" 0.3 ML MISC 1 Device by Does not apply route 3 (three) times daily. 100 each 3  . levothyroxine (SYNTHROID, LEVOTHROID) 112 MCG tablet 2 tabs daily 100 tablet 5  . lisinopril (PRINIVIL,ZESTRIL) 10 MG tablet Take 10 mg by mouth daily.  5  . metFORMIN (GLUCOPHAGE-XR) 500 MG 24 hr tablet Take 500 mg by mouth 2 (two) times daily.    . potassium citrate (UROCIT-K) 10 MEQ (1080 MG) SR tablet Take 10 mEq by mouth 4 (four) times daily.     No current facility-administered medications for this visit.    Allergies:   Doxycycline; Toradol; Food; and Other    Social History:  The patient  reports that she has never smoked. She has never used smokeless tobacco. She reports that she does not drink alcohol or use illicit drugs.   Family History:  The patient's family history includes Diabetes in her maternal grandmother and mother; Ovarian cancer in her paternal grandmother.    ROS:  Please see the history of present illness.  Review of Systems: Constitutional:  denies fever, chills, diaphoresis, appetite change and fatigue.  HEENT: denies photophobia, eye pain, redness, hearing loss, ear pain, congestion, sore throat, rhinorrhea, sneezing, neck pain, neck stiffness and tinnitus.  Respiratory: denies SOB, DOE, cough, chest tightness, and wheezing.  Cardiovascular: admits to  palpitations    Gastrointestinal: denies nausea, vomiting, abdominal pain, diarrhea, constipation, blood in stool.  Genitourinary: denies dysuria, urgency, frequency, hematuria, flank pain and difficulty urinating.  Musculoskeletal: denies  myalgias, back pain, joint swelling, arthralgias and gait problem.   Skin: denies pallor, rash and wound.  Neurological: denies dizziness, seizures, syncope,  weakness, light-headedness, numbness and headaches.   Hematological: denies adenopathy, easy bruising, personal or family bleeding history.  Psychiatric/ Behavioral: denies suicidal ideation, mood changes, confusion, nervousness, sleep disturbance and agitation.       All other systems are reviewed and negative.    PHYSICAL EXAM: VS:  BP 120/80 mmHg  Pulse 101  Ht 5\' 4"  (1.626 m)  Wt 262 lb (118.842 kg)  BMI 44.95 kg/m2  SpO2 97%  LMP 12/31/2014 , BMI Body mass index is 44.95 kg/(m^2). GEN: Well nourished, well developed, in no acute distress HEENT: normal Neck: no JVD, carotid bruits, or masses Cardiac: RRR; no murmurs, rubs, or gallops,no edema  Respiratory:  clear to auscultation bilaterally, normal work of breathing GI: soft, nontender, nondistended, + BS MS: no deformity or atrophy Skin: warm and dry, no rash Neuro:  Strength and sensation are intact Psych: normal   EKG:  EKG is not ordered today. The ekg ordered 01/28/15  demonstrates sinus tach .  No ST or T wave changes.    Recent Labs: 10/28/2014: ALT 22 01/28/2015: BUN 12; Creatinine, Ser 0.85; Hemoglobin 13.5; Platelets 286; Potassium 4.2; Sodium 136    Lipid Panel No results found for: CHOL, TRIG, HDL, CHOLHDL, VLDL, LDLCALC, LDLDIRECT    Wt Readings from Last 3 Encounters:  02/18/15 262 lb (118.842 kg)  01/29/15 260 lb (117.935 kg)  01/28/15 260 lb (117.935 kg)      Other studies Reviewed: Additional studies/ records that were reviewed today include: . Review of the above records demonstrates:    ASSESSMENT AND PLAN:  1.  Palpitations: She presents with a history of palpitations. Clinically, it sounds like she may have supraventricular tachycardia. She has sudden episodes where her heart rate will go up for about an hour.   Her  Elevated HR  will then suddenly resolve.  We'll start her on metoprolol 25 mg twice a day. I will also give her prescription for propranolol to take on an as-needed  basis. We'll give her 30 day event monitor.  I've encouraged her to work on improved diet and exercise and weight loss program. She works 2 different jobs so it's difficult for her to get regular exercise.  2. Diabetes mellitus: Her diabetes has been poorly controlled. She's been working on that.  3. Hypothyroidism: She is on Synthroid. Followed by Dr. Loanne Drilling.  Last TSH is normal.  Current medicines are reviewed at length with the patient today.  The patient does not have concerns regarding medicines.  The following changes have been made:  no change  Labs/ tests ordered today include:  No orders of the defined types were placed in this encounter.     Disposition:   FU with me in 3 months .     Alison Fitzgerald, Wonda Cheng, MD  02/18/2015 10:10 AM    [place],  [place]  [address] Phone: [phone]; Fax: [phone]   [facility]  [address] [place], [place]  [address] [phone]   Fax [phone]

## 2015-02-18 NOTE — Patient Instructions (Signed)
Medication Instructions:  START Metoprolol 25 mg twice daily - take 12 hours apart TAKE Propranolol 10 mg up to 4 times per day as needed for palpitations and/or fast heart rate   Labwork: None Ordered   Testing/Procedures: Your physician has recommended that you wear an event monitor. Event monitors are medical devices that record the heart's electrical activity. Doctors most often [place] these monitors to diagnose arrhythmias. Arrhythmias are problems with the speed or rhythm of the heartbeat. The monitor is a small, portable device. You can wear one while you do your normal daily activities. This is usually used to diagnose what is causing palpitations/syncope (passing out).   Follow-Up: Your physician recommends that you schedule a follow-up appointment in: 3 months with Dr. Acie Fredrickson  If you need a refill on your cardiac medications before your next appointment, please call your pharmacy.   Thank you for choosing [facility]! Christen Bame, RN [phone]

## 2015-02-18 NOTE — Telephone Encounter (Signed)
New Message    Pt calling stating that Dr. Acie Fredrickson wants her to start taking Metoprolol and pt was looking at the possible issues with this medication and states it causes Kidney Stones and can negatively effect pt's with Hypothyroidism. Pt states she already gets kidney stones frequently and she only has one kidney and she also has Hypothyroidism and wants to know if it would really be safe for her to take this medication. Please call back and advise.

## 2015-02-18 NOTE — Telephone Encounter (Signed)
Spoke with patient and advised that Dr. Acie Fredrickson is aware of effect of Metoprolol in hypothyroidism and he would still recommend she take Metoprolol for tachycardia.  I advised that we are not aware of metoprolol causing kidney stones.  Patient states an Internet search she did states metoprolol causes kidney stones after 2-5 years in all patients.  I advised her that we are not aware of that side effect.   Dr. Acie Fredrickson advised that patient needs better control of her heart rate so as not to cause adverse effects on the heart function.  I advised her to continue close follow-up with Dr. Loanne Drilling for hypothyroidism and to notify us with future concerns. I advised her of most common side effect being fatigue.  She verbalized understanding and agreement.

## 2015-02-19 ENCOUNTER — Ambulatory Visit (INDEPENDENT_AMBULATORY_CARE_PROVIDER_SITE_OTHER): Payer: BLUE CROSS/BLUE SHIELD

## 2015-02-19 DIAGNOSIS — I471 Supraventricular tachycardia: Secondary | ICD-10-CM

## 2015-02-20 ENCOUNTER — Ambulatory Visit (INDEPENDENT_AMBULATORY_CARE_PROVIDER_SITE_OTHER): Payer: BLUE CROSS/BLUE SHIELD | Admitting: Urology

## 2015-02-20 ENCOUNTER — Other Ambulatory Visit: Payer: Self-pay | Admitting: Urology

## 2015-02-20 DIAGNOSIS — N200 Calculus of kidney: Secondary | ICD-10-CM | POA: Diagnosis not present

## 2015-02-20 DIAGNOSIS — E7201 Cystinuria: Secondary | ICD-10-CM

## 2015-02-21 ENCOUNTER — Ambulatory Visit (HOSPITAL_COMMUNITY)
Admission: RE | Admit: 2015-02-21 | Discharge: 2015-02-21 | Disposition: A | Discharge status: Home / Self Care | Payer: BLUE CROSS/BLUE SHIELD | Source: Ambulatory Visit | Attending: Urology | Admitting: Urology

## 2015-02-21 DIAGNOSIS — Z905 Acquired absence of kidney: Secondary | ICD-10-CM | POA: Diagnosis not present

## 2015-02-21 DIAGNOSIS — E7201 Cystinuria: Secondary | ICD-10-CM | POA: Diagnosis not present

## 2015-02-21 DIAGNOSIS — N2881 Hypertrophy of kidney: Secondary | ICD-10-CM | POA: Insufficient documentation

## 2015-02-23 ENCOUNTER — Emergency Department
Admission: EM | Admit: 2015-02-23 | Discharge: 2015-02-24 | Disposition: A | Discharge status: Home / Self Care | Payer: Worker's Compensation | Attending: Emergency Medicine | Admitting: Emergency Medicine

## 2015-02-23 ENCOUNTER — Encounter: Payer: Self-pay | Admitting: Emergency Medicine

## 2015-02-23 ENCOUNTER — Emergency Department: Payer: Worker's Compensation

## 2015-02-23 DIAGNOSIS — S8012XA Contusion of left lower leg, initial encounter: Secondary | ICD-10-CM | POA: Diagnosis not present

## 2015-02-23 DIAGNOSIS — E119 Type 2 diabetes mellitus without complications: Secondary | ICD-10-CM | POA: Insufficient documentation

## 2015-02-23 DIAGNOSIS — S7012XA Contusion of left thigh, initial encounter: Secondary | ICD-10-CM | POA: Insufficient documentation

## 2015-02-23 DIAGNOSIS — S39012A Strain of muscle, fascia and tendon of lower back, initial encounter: Secondary | ICD-10-CM | POA: Diagnosis not present

## 2015-02-23 DIAGNOSIS — Z794 Long term (current) use of insulin: Secondary | ICD-10-CM | POA: Diagnosis not present

## 2015-02-23 DIAGNOSIS — Z7984 Long term (current) use of oral hypoglycemic drugs: Secondary | ICD-10-CM | POA: Diagnosis not present

## 2015-02-23 DIAGNOSIS — Z79899 Other long term (current) drug therapy: Secondary | ICD-10-CM | POA: Diagnosis not present

## 2015-02-23 DIAGNOSIS — Y9289 Other specified places as the place of occurrence of the external cause: Secondary | ICD-10-CM | POA: Insufficient documentation

## 2015-02-23 DIAGNOSIS — S3992XA Unspecified injury of lower back, initial encounter: Secondary | ICD-10-CM | POA: Diagnosis present

## 2015-02-23 DIAGNOSIS — Y990 Civilian activity done for income or pay: Secondary | ICD-10-CM | POA: Diagnosis not present

## 2015-02-23 DIAGNOSIS — S40012A Contusion of left shoulder, initial encounter: Secondary | ICD-10-CM | POA: Insufficient documentation

## 2015-02-23 DIAGNOSIS — W010XXA Fall on same level from slipping, tripping and stumbling without subsequent striking against object, initial encounter: Secondary | ICD-10-CM | POA: Diagnosis not present

## 2015-02-23 DIAGNOSIS — Y9389 Activity, other specified: Secondary | ICD-10-CM | POA: Diagnosis not present

## 2015-02-23 DIAGNOSIS — W19XXXA Unspecified fall, initial encounter: Secondary | ICD-10-CM

## 2015-02-23 MED ORDER — OXYCODONE-ACETAMINOPHEN 5-325 MG PO TABS: 1.0000 | ORAL_TABLET | ORAL | Status: DC | PRN

## 2015-02-23 MED ORDER — DIAZEPAM 2 MG PO TABS
2.0000 mg | ORAL_TABLET | Freq: Once | ORAL | Status: AC
  Administered 2015-02-23: 2 mg via ORAL
  Filled 2015-02-23: qty 1

## 2015-02-23 MED ORDER — DIAZEPAM 2 MG PO TABS: 2.0000 mg | ORAL_TABLET | Freq: Three times a day (TID) | ORAL | Status: DC | PRN

## 2015-02-23 MED ORDER — OXYCODONE-ACETAMINOPHEN 5-325 MG PO TABS
1.0000 | ORAL_TABLET | Freq: Once | ORAL | Status: AC
  Administered 2015-02-23: 1 via ORAL
  Filled 2015-02-23: qty 1

## 2015-02-23 NOTE — ED Notes (Signed)
Pt reports falling at work, tripped over bread racks at around 1800. Pt drove self to ER from work. Pt reports injuries to left shoulder, lower back, and left leg. Pt unable to raise left arm. Pt rates pain 6/10 to lower back and left shoulder.

## 2015-02-23 NOTE — ED Provider Notes (Signed)
[facility] Emergency Department Provider Note  ____________________________________________  Time seen: Approximately 11:24 PM  I have reviewed the triage vital signs and the nursing notes.   HISTORY  Chief Complaint Fall; Shoulder Pain; and Back Pain    HPI Alison Fitzgerald is a 34 y.o. female who presents to the ED from work with a chief complaint of fall. Patient works at [employer] and tripped over a bread rack at approximately 6 PM. She initially got her left leg caught in the bread rack which caused her to fall onto her bottom then onto her left (nondominant) shoulder.patient denies LOC or striking head. She took Motrin and iced her injuries but presents complaining of stiff lower back and left shoulder pain. Denies associated neck pain, chest pain, shortness of breath, abdominal pain, nausea, vomiting, diarrhea. Nothing makes her pain better; movement makes her pain worse.  Past Medical History  Diagnosis Date  . Diabetes mellitus without complication ([place])   . Hypothyroidism   . Kidney stones   . Staghorn kidney stones     right nephrectomy     Patient Active Problem List   Diagnosis Date Noted  . SVT (supraventricular tachycardia) ([place]) 02/18/2015  . PCO (polycystic ovaries) 01/29/2015  . Dyslipidemia 01/29/2015  . Diabetes mellitus without complication ([place])   . Hypothyroidism   . Staghorn kidney stones     Past Surgical History  Procedure Laterality Date  . Nephrectomy      Current Outpatient Rx  Name  Route  Sig  Dispense  Refill  . glucose blood test strip   Other   1 each by Other route 2 (two) times daily. And lancets 2/day   100 each   12   . ibuprofen (ADVIL,MOTRIN) 200 MG tablet   Oral   Take 400 mg by mouth every 6 (six) hours as needed for mild pain.         . Insulin Glargine (LANTUS SOLOSTAR) 100 UNIT/ML Solostar Pen   Subcutaneous   Inject 10 Units into the skin at bedtime.   15 mL   0   . Insulin Pen  Needle 32G X 4 MM MISC   Does not apply   1 Device by Does not apply route daily.   50 each   11   . insulin regular (NOVOLIN R,HUMULIN R) 100 units/mL injection   Subcutaneous   Inject 0.1 mLs (10 Units total) into the skin 3 (three) times daily before meals.   30 mL   3   . Insulin Syringe-Needle U-100 31G X 5/16" 0.3 ML MISC   Does not apply   1 Device by Does not apply route 3 (three) times daily.   100 each   3   . levothyroxine (SYNTHROID, LEVOTHROID) 112 MCG tablet      2 tabs daily   100 tablet   5   . lisinopril (PRINIVIL,ZESTRIL) 10 MG tablet   Oral   Take 10 mg by mouth daily.      5   . metFORMIN (GLUCOPHAGE-XR) 500 MG 24 hr tablet   Oral   Take 500 mg by mouth 2 (two) times daily.         . metoprolol tartrate (LOPRESSOR) 25 MG tablet   Oral   Take 1 tablet (25 mg total) by mouth 2 (two) times daily.   180 tablet   3   . potassium citrate (UROCIT-K) 10 MEQ (1080 MG) SR tablet   Oral   Take 10 mEq  by mouth 4 (four) times daily.         . propranolol (INDERAL) 10 MG tablet   Oral   Take 1 tablet (10 mg total) by mouth 4 (four) times daily as needed.   60 tablet   11     Allergies Doxycycline; Toradol; Food; and Other  Family History  Problem Relation Age of Onset  . Ovarian cancer Paternal Grandmother   . Diabetes Maternal Grandmother   . Diabetes Mother     Social History Social History  Substance Use Topics  . Smoking status: Never Smoker   . Smokeless tobacco: Never Used  . Alcohol Use: No    Review of Systems Constitutional: No fever/chills Eyes: No visual changes. ENT: No sore throat. Cardiovascular: Denies chest pain. Respiratory: Denies shortness of breath. Gastrointestinal: No abdominal pain.  No nausea, no vomiting.  No diarrhea.  No constipation. Genitourinary: Negative for dysuria. Musculoskeletal: Positive for back pain and left shoulder pain. Skin: Negative for rash. Neurological: Negative for headaches, focal  weakness or numbness.  10-point ROS otherwise negative.  ____________________________________________   PHYSICAL EXAM:  VITAL SIGNS: ED Triage Vitals  Enc Vitals Group     BP 02/23/15 2232 144/98 mmHg     Pulse Rate 02/23/15 2232 106     Resp 02/23/15 2232 18     Temp 02/23/15 2232 98.1 F (36.7 C)     Temp Source 02/23/15 2232 Oral     SpO2 02/23/15 2232 96 %     Weight 02/23/15 2232 260 lb (117.935 kg)     Height 02/23/15 2232 5\' 4"  (1.626 m)     Head Cir --      Peak Flow --      Pain Score 02/23/15 2233 6     Pain Loc --      Pain Edu? --      Excl. in [place]? --     Constitutional: Alert and oriented. Well appearing and in mild acute distress. Eyes: Conjunctivae are normal. PERRL. EOMI. Head: Atraumatic. Nose: No congestion/rhinnorhea. Mouth/Throat: Mucous membranes are moist.  Oropharynx non-erythematous. Neck: No stridor.  No cervical spine tenderness to palpation.  No step-offs or deformities. Cardiovascular: Normal rate, regular rhythm. Grossly normal heart sounds.  Good peripheral circulation. Respiratory: Normal respiratory effort.  No retractions. Lungs CTAB. Gastrointestinal: Soft and nontender. No distention. No abdominal bruits. No CVA tenderness. Musculoskeletal: No spinal tenderness to palpation. Lumbar paraspinal muscle spasms bilaterally. Left shoulder tender to palpation anteriorly. Limited range of motion secondary to pain. 2+ radial pulses. Brisk, less than 5 second capillary refill. Equal grip strength. No lower extremity tenderness nor edema.  Small areas of contusion to left lateral lower leg and left lateral thigh. No joint effusions. Neurologic:  Normal speech and language. No gross focal neurologic deficits are appreciated. No gait instability. Skin:  Skin is warm, dry and intact. No rash noted. Psychiatric: Mood and affect are normal. Speech and behavior are normal.  ____________________________________________   LABS (all labs ordered are  listed, but only abnormal results are displayed)  Labs Reviewed - No data to display ____________________________________________  EKG  none ____________________________________________  RADIOLOGY  Left shoulder x-rays (viewed by me, interpreted per Dr. Marisue Humble): Negative radiographs of the left shoulder. No fracture or dislocation. ____________________________________________   PROCEDURES  Procedure(s) performed: None  Critical Care performed: No  ____________________________________________   INITIAL IMPRESSION / ASSESSMENT AND PLAN / ED COURSE  Pertinent labs & imaging results that were available during my care of  the patient were reviewed by me and considered in my medical decision making (see chart for details).  34 year old female who presents with lumbar sacral strain and left shoulder contusion s/p mechanical fall at work. Will initiate treatment with analgesia and muscle relaxer; shoulder sling and orthopedics follow-up. Strict return precautions given. Patient verbalizes understanding and agrees with plan of care. ____________________________________________   FINAL CLINICAL IMPRESSION(S) / ED DIAGNOSES  Final diagnoses:  Fall, initial encounter  Lumbosacral strain, initial encounter  Shoulder contusion, left, initial encounter      Paulette Blanch, MD 02/24/15 [id number]

## 2015-02-23 NOTE — ED Notes (Signed)
Patient states that she was working and slipped and fell. Patient with complaint of lower back pain and right shoulder pain.

## 2015-02-23 NOTE — Discharge Instructions (Signed)
1. Take medicines as needed for pain and muscle spasms (Percocet/Valium #15). 2. Wear sling as needed for comfort. You may remove sling to bathe and sleep. 3. Apply moist heat to lower back area several times daily. 4. Return to the ER for worsening symptoms, persistent vomiting, difficulty breathing or other concerns.  Contusion A contusion is a deep bruise. Contusions are the result of a blunt injury to tissues and muscle fibers under the skin. The injury causes bleeding under the skin. The skin overlying the contusion may turn blue, purple, or yellow. Minor injuries will give you a painless contusion, but more severe contusions may stay painful and swollen for a few weeks.  CAUSES  This condition is usually caused by a blow, trauma, or direct force to an area of the body. SYMPTOMS  Symptoms of this condition include:  Swelling of the injured area.  Pain and tenderness in the injured area.  Discoloration. The area may have redness and then turn blue, purple, or yellow. DIAGNOSIS  This condition is diagnosed based on a physical exam and medical history. An X-ray, CT scan, or MRI may be needed to determine if there are any associated injuries, such as broken bones (fractures). TREATMENT  Specific treatment for this condition depends on what area of the body was injured. In general, the best treatment for a contusion is resting, icing, applying pressure to (compression), and elevating the injured area. This is often called the RICE strategy. Over-the-counter anti-inflammatory medicines may also be recommended for pain control.  HOME CARE INSTRUCTIONS   Rest the injured area.  If directed, apply ice to the injured area:  Put ice in a plastic bag.  Place a towel between your skin and the bag.  Leave the ice on for 20 minutes, 2-3 times per day.  If directed, apply light compression to the injured area using an elastic bandage. Make sure the bandage is not wrapped too tightly. Remove and  reapply the bandage as directed by your health care provider.  If possible, raise (elevate) the injured area above the level of your heart while you are sitting or lying down.  Take over-the-counter and prescription medicines only as told by your health care provider. SEEK MEDICAL CARE IF:  Your symptoms do not improve after several days of treatment.  Your symptoms get worse.  You have difficulty moving the injured area. SEEK IMMEDIATE MEDICAL CARE IF:   You have severe pain.  You have numbness in a hand or foot.  Your hand or foot turns pale or cold.   This information is not intended to replace advice given to you by your health care provider. Make sure you discuss any questions you have with your health care provider.   Document Released: 12/31/2004 Document Revised: 12/12/2014 Document Reviewed: 08/08/2014 Elsevier Interactive Patient Education 2016 [facility] A sprain is an injury in which a ligament is torn. The ligaments of the lower back are vulnerable to sprains. However, they are strong and require great force to be injured. These ligaments are important for stabilizing the spinal column. Sprains are classified into three categories. Grade 1 sprains cause pain, but the tendon is not lengthened. Grade 2 sprains include a lengthened ligament, due to the ligament being stretched or partially ruptured. With grade 2 sprains there is still function, although the function may be decreased. Grade 3 sprains involve a complete tear of the tendon or muscle, and function is usually impaired. SYMPTOMS  Severe pain in the lower back.  Sometimes, a feeling of a "pop," "snap," or tear, at the time of injury.  Tenderness and sometimes swelling at the injury site.  Uncommonly, bruising (contusion) within 48 hours of injury.  Muscle spasms in the back. CAUSES  Low back sprains occur when a force is placed on the ligaments that is greater than they can  handle. Common causes of injury include:  Performing a stressful act while off-balance.  Repetitive stressful activities that involve movement of the lower back.  Direct hit (trauma) to the lower back. RISK INCREASES WITH:  Contact sports (football, wrestling).  Collisions (major skiing accidents).  Sports that require throwing or lifting (baseball, weightlifting).  Sports involving twisting of the spine (gymnastics, diving, tennis, golf).  Poor strength and flexibility.  Inadequate protection.  Previous back injury or surgery (especially fusion). PREVENTION  Wear properly fitted and padded protective equipment.  Warm up and stretch properly before activity.  Allow for adequate recovery between workouts.  Maintain physical fitness:  Strength, flexibility, and endurance.  Cardiovascular fitness.  Maintain a healthy body weight. PROGNOSIS  If treated properly, low back sprains usually heal with non-surgical treatment. The length of time for healing depends on the severity of the injury.  RELATED COMPLICATIONS   Recurring symptoms, resulting in a chronic problem.  Chronic inflammation and pain in the low back.  Delayed healing or resolution of symptoms, especially if activity is resumed too soon.  Prolonged impairment.  Unstable or arthritic joints of the low back. TREATMENT  Treatment first involves the use of ice and medicine, to reduce pain and inflammation. The use of strengthening and stretching exercises may help reduce pain with activity. These exercises may be performed at home or with a therapist. Severe injuries may require referral to a therapist for further evaluation and treatment, such as ultrasound. Your caregiver may advise that you wear a back brace or corset, to help reduce pain and discomfort. Often, prolonged bed rest results in greater harm then benefit. Corticosteroid injections may be recommended. However, these should be reserved for the most  serious cases. It is important to avoid using your back when lifting objects. At night, sleep on your back on a firm mattress, with a pillow placed under your knees. If non-surgical treatment is unsuccessful, surgery may be needed.  MEDICATION   If pain medicine is needed, nonsteroidal anti-inflammatory medicines (aspirin and ibuprofen), or other minor pain relievers (acetaminophen), are often advised.  Do not take pain medicine for 7 days before surgery.  Prescription pain relievers may be given, if your caregiver thinks they are needed. Use only as directed and only as much as you need.  Ointments applied to the skin may be helpful.  Corticosteroid injections may be given by your caregiver. These injections should be reserved for the most serious cases, because they may only be given a certain number of times. HEAT AND COLD  Cold treatment (icing) should be applied for 10 to 15 minutes every 2 to 3 hours for inflammation and pain, and immediately after activity that aggravates your symptoms. Use ice packs or an ice massage.  Heat treatment may be used before performing stretching and strengthening activities prescribed by your caregiver, physical therapist, or athletic trainer. Use a heat pack or a warm water soak. SEEK MEDICAL CARE IF:   Symptoms get worse or do not improve in 2 to 4 weeks, despite treatment.  You develop numbness or weakness in either leg.  You lose bowel  or bladder function.  Any of the following occur after surgery: fever, increased pain, swelling, redness, drainage of fluids, or bleeding in the affected area.  New, unexplained symptoms develop. (Drugs used in treatment may produce side effects.) EXERCISES  RANGE OF MOTION (ROM) AND STRETCHING EXERCISES - Low Back Sprain Most people with lower back pain will find that their symptoms get worse with excessive bending forward (flexion) or arching at the lower back (extension). The exercises that will help resolve  your symptoms will focus on the opposite motion.  Your physician, physical therapist or athletic trainer will help you determine which exercises will be most helpful to resolve your lower back pain. Do not complete any exercises without first consulting with your caregiver. Discontinue any exercises which make your symptoms worse, until you speak to your caregiver. If you have pain, numbness or tingling which travels down into your buttocks, leg or foot, the goal of the therapy is for these symptoms to move closer to your back and eventually resolve. Sometimes, these leg symptoms will get better, but your lower back pain may worsen. This is often an indication of progress in your rehabilitation. Be very alert to any changes in your symptoms and the activities in which you participated in the 24 hours prior to the change. Sharing this information with your caregiver will allow him or her to most efficiently treat your condition. These exercises may help you when beginning to rehabilitate your injury. Your symptoms may resolve with or without further involvement from your physician, physical therapist or athletic trainer. While completing these exercises, remember:   Restoring tissue flexibility helps normal motion to return to the joints. This allows healthier, less painful movement and activity.  An effective stretch should be held for at least 30 seconds.  A stretch should never be painful. You should only feel a gentle lengthening or release in the stretched tissue. FLEXION RANGE OF MOTION AND STRETCHING EXERCISES: STRETCH - Flexion, Single Knee to Chest   Lie on a firm bed or floor with both legs extended in front of you.  Keeping one leg in contact with the floor, bring your opposite knee to your chest. Hold your leg in place by either grabbing behind your thigh or at your knee.  Pull until you feel a gentle stretch in your low back. Hold __________ seconds.  Slowly release your grasp and  repeat the exercise with the opposite side. Repeat __________ times. Complete this exercise __________ times per day.  STRETCH - Flexion, Double Knee to Chest  Lie on a firm bed or floor with both legs extended in front of you.  Keeping one leg in contact with the floor, bring your opposite knee to your chest.  Tense your stomach muscles to support your back and then lift your other knee to your chest. Hold your legs in place by either grabbing behind your thighs or at your knees.  Pull both knees toward your chest until you feel a gentle stretch in your low back. Hold __________ seconds.  Tense your stomach muscles and slowly return one leg at a time to the floor. Repeat __________ times. Complete this exercise __________ times per day.  STRETCH - Low Trunk Rotation  Lie on a firm bed or floor. Keeping your legs in front of you, bend your knees so they are both pointed toward the ceiling and your feet are flat on the floor.  Extend your arms out to the side. This will stabilize your upper body by  keeping your shoulders in contact with the floor.  Gently and slowly drop both knees together to one side until you feel a gentle stretch in your low back. Hold for __________ seconds.  Tense your stomach muscles to support your lower back as you bring your knees back to the starting position. Repeat the exercise to the other side. Repeat __________ times. Complete this exercise __________ times per day  EXTENSION RANGE OF MOTION AND FLEXIBILITY EXERCISES: STRETCH - Extension, Prone on Elbows   Lie on your stomach on the floor, a bed will be too soft. Place your palms about shoulder width apart and at the height of your head.  Place your elbows under your shoulders. If this is too painful, stack pillows under your chest.  Allow your body to relax so that your hips drop lower and make contact more completely with the floor.  Hold this position for __________ seconds.  Slowly return to  lying flat on the floor. Repeat __________ times. Complete this exercise __________ times per day.  RANGE OF MOTION - Extension, Prone Press Ups  Lie on your stomach on the floor, a bed will be too soft. Place your palms about shoulder width apart and at the height of your head.  Keeping your back as relaxed as possible, slowly straighten your elbows while keeping your hips on the floor. You may adjust the placement of your hands to maximize your comfort. As you gain motion, your hands will come more underneath your shoulders.  Hold this position __________ seconds.  Slowly return to lying flat on the floor. Repeat __________ times. Complete this exercise __________ times per day.  RANGE OF MOTION- Quadruped, Neutral Spine   Assume a hands and knees position on a firm surface. Keep your hands under your shoulders and your knees under your hips. You may place padding under your knees for comfort.  Drop your head and point your tailbone toward the ground below you. This will round out your lower back like an angry cat. Hold this position for __________ seconds.  Slowly lift your head and release your tail bone so that your back sags into a large arch, like an old horse.  Hold this position for __________ seconds.  Repeat this until you feel limber in your low back.  Now, find your "sweet spot." This will be the most comfortable position somewhere between the two previous positions. This is your neutral spine. Once you have found this position, tense your stomach muscles to support your low back.  Hold this position for __________ seconds. Repeat __________ times. Complete this exercise __________ times per day.  STRENGTHENING EXERCISES - Low Back Sprain These exercises may help you when beginning to rehabilitate your injury. These exercises should be done near your "sweet spot." This is the neutral, low-back arch, somewhere between fully rounded and fully arched, that is your least painful  position. When performed in this safe range of motion, these exercises can be used for people who have either a flexion or extension based injury. These exercises may resolve your symptoms with or without further involvement from your physician, physical therapist or athletic trainer. While completing these exercises, remember:   Muscles can gain both the endurance and the strength needed for everyday activities through controlled exercises.  Complete these exercises as instructed by your physician, physical therapist or athletic trainer. Increase the resistance and repetitions only as guided.  You may experience muscle soreness or fatigue, but the pain or discomfort you are trying  to eliminate should never worsen during these exercises. If this pain does worsen, stop and make certain you are following the directions exactly. If the pain is still present after adjustments, discontinue the exercise until you can discuss the trouble with your caregiver. STRENGTHENING - Deep Abdominals, Pelvic Tilt   Lie on a firm bed or floor. Keeping your legs in front of you, bend your knees so they are both pointed toward the ceiling and your feet are flat on the floor.  Tense your lower abdominal muscles to press your low back into the floor. This motion will rotate your pelvis so that your tail bone is scooping upwards rather than pointing at your feet or into the floor. With a gentle tension and even breathing, hold this position for __________ seconds. Repeat __________ times. Complete this exercise __________ times per day.  STRENGTHENING - Abdominals, Crunches   Lie on a firm bed or floor. Keeping your legs in front of you, bend your knees so they are both pointed toward the ceiling and your feet are flat on the floor. Cross your arms over your chest.  Slightly tip your chin down without bending your neck.  Tense your abdominals and slowly lift your trunk high enough to just clear your shoulder blades.  Lifting higher can put excessive stress on the lower back and does not further strengthen your abdominal muscles.  Control your return to the starting position. Repeat __________ times. Complete this exercise __________ times per day.  STRENGTHENING - Quadruped, Opposite UE/LE Lift   Assume a hands and knees position on a firm surface. Keep your hands under your shoulders and your knees under your hips. You may place padding under your knees for comfort.  Find your neutral spine and gently tense your abdominal muscles so that you can maintain this position. Your shoulders and hips should form a rectangle that is parallel with the floor and is not twisted.  Keeping your trunk steady, lift your right hand no higher than your shoulder and then your left leg no higher than your hip. Make sure you are not holding your breath. Hold this position for __________ seconds.  Continuing to keep your abdominal muscles tense and your back steady, slowly return to your starting position. Repeat with the opposite arm and leg. Repeat __________ times. Complete this exercise __________ times per day.  STRENGTHENING - Abdominals and Quadriceps, Straight Leg Raise   Lie on a firm bed or floor with both legs extended in front of you.  Keeping one leg in contact with the floor, bend the other knee so that your foot can rest flat on the floor.  Find your neutral spine, and tense your abdominal muscles to maintain your spinal position throughout the exercise.  Slowly lift your straight leg off the floor about 6 inches for a count of 15, making sure to not hold your breath.  Still keeping your neutral spine, slowly lower your leg all the way to the floor. Repeat this exercise with each leg __________ times. Complete this exercise __________ times per day. POSTURE AND BODY MECHANICS CONSIDERATIONS - Low Back Sprain Keeping correct posture when sitting, standing or completing your activities will reduce the stress  put on different body tissues, allowing injured tissues a chance to heal and limiting painful experiences. The following are general guidelines for improved posture. Your physician or physical therapist will provide you with any instructions specific to your needs. While reading these guidelines, remember:  The exercises prescribed by your  provider will help you have the flexibility and strength to maintain correct postures.  The correct posture provides the best environment for your joints to work. All of your joints have less wear and tear when properly supported by a spine with good posture. This means you will experience a healthier, less painful body.  Correct posture must be practiced with all of your activities, especially prolonged sitting and standing. Correct posture is as important when doing repetitive low-stress activities (typing) as it is when doing a single heavy-load activity (lifting). RESTING POSITIONS Consider which positions are most painful for you when choosing a resting position. If you have pain with flexion-based activities (sitting, bending, stooping, squatting), choose a position that allows you to rest in a less flexed posture. You would want to avoid curling into a fetal position on your side. If your pain worsens with extension-based activities (prolonged standing, working overhead), avoid resting in an extended position such as sleeping on your stomach. Most people will find more comfort when they rest with their spine in a more neutral position, neither too rounded nor too arched. Lying on a non-sagging bed on your side with a pillow between your knees, or on your back with a pillow under your knees will often provide some relief. Keep in mind, being in any one position for a prolonged period of time, no matter how correct your posture, can still lead to stiffness. PROPER SITTING POSTURE In order to minimize stress and discomfort on your spine, you must sit with correct  posture. Sitting with good posture should be effortless for a healthy body. Returning to good posture is a gradual process. Many people can work toward this most comfortably by using various supports until they have the flexibility and strength to maintain this posture on their own. When sitting with proper posture, your ears will fall over your shoulders and your shoulders will fall over your hips. You should use the back of the chair to support your upper back. Your lower back will be in a neutral position, just slightly arched. You may place a small pillow or folded towel at the base of your lower back for  support.  When working at a desk, create an environment that supports good, upright posture. Without extra support, muscles tire, which leads to excessive strain on joints and other tissues. Keep these recommendations in mind: CHAIR:  A chair should be able to slide under your desk when your back makes contact with the back of the chair. This allows you to work closely.  The chair's height should allow your eyes to be level with the upper part of your monitor and your hands to be slightly lower than your elbows. BODY POSITION  Your feet should make contact with the floor. If this is not possible, use a foot rest.  Keep your ears over your shoulders. This will reduce stress on your neck and low back. INCORRECT SITTING POSTURES  If you are feeling tired and unable to assume a healthy sitting posture, do not slouch or slump. This puts excessive strain on your back tissues, causing more damage and pain. Healthier options include:  Using more support, like a lumbar pillow.  Switching tasks to something that requires you to be upright or walking.  Talking a brief walk.  Lying down to rest in a neutral-spine position. PROLONGED STANDING WHILE SLIGHTLY LEANING FORWARD  When completing a task that requires you to lean forward while standing in one place for a long time, place  either foot up on  a stationary 2-4 inch high object to help maintain the best posture. When both feet are on the ground, the lower back tends to lose its slight inward curve. If this curve flattens (or becomes too large), then the back and your other joints will experience too much stress, tire more quickly, and can cause pain. CORRECT STANDING POSTURES Proper standing posture should be assumed with all daily activities, even if they only take a few moments, like when brushing your teeth. As in sitting, your ears should fall over your shoulders and your shoulders should fall over your hips. You should keep a slight tension in your abdominal muscles to brace your spine. Your tailbone should point down to the ground, not behind your body, resulting in an over-extended swayback posture.  INCORRECT STANDING POSTURES  Common incorrect standing postures include a forward head, locked knees and/or an excessive swayback. WALKING Walk with an upright posture. Your ears, shoulders and hips should all line-up. PROLONGED ACTIVITY IN A FLEXED POSITION When completing a task that requires you to bend forward at your waist or lean over a low surface, try to find a way to stabilize 3 out of 4 of your limbs. You can place a hand or elbow on your thigh or rest a knee on the surface you are reaching across. This will provide you more stability, so that your muscles do not tire as quickly. By keeping your knees relaxed, or slightly bent, you will also reduce stress across your lower back. CORRECT LIFTING TECHNIQUES DO :  Assume a wide stance. This will provide you more stability and the opportunity to get as close as possible to the object which you are lifting.  Tense your abdominals to brace your spine. Bend at the knees and hips. Keeping your back locked in a neutral-spine position, lift using your leg muscles. Lift with your legs, keeping your back straight.  Test the weight of unknown objects before attempting to lift them.  Try  to keep your elbows locked down at your sides in order get the best strength from your shoulders when carrying an object.  Always ask for help when lifting heavy or awkward objects. INCORRECT LIFTING TECHNIQUES DO NOT:   Lock your knees when lifting, even if it is a small object.  Bend and twist. Pivot at your feet or move your feet when needing to change directions.  Assume that you can safely pick up even a paperclip without proper posture.   This information is not intended to replace advice given to you by your health care provider. Make sure you discuss any questions you have with your health care provider.   Document Released: 03/23/2005 Document Revised: 04/13/2014 Document Reviewed: 07/05/2008 Elsevier Interactive Patient Education [employer].

## 2015-03-22 ENCOUNTER — Emergency Department (HOSPITAL_COMMUNITY)
Admission: EM | Admit: 2015-03-22 | Discharge: 2015-03-22 | Disposition: A | Discharge status: Home / Self Care | Payer: BLUE CROSS/BLUE SHIELD | Attending: Emergency Medicine | Admitting: Emergency Medicine

## 2015-03-22 ENCOUNTER — Encounter (HOSPITAL_COMMUNITY): Payer: Self-pay

## 2015-03-22 DIAGNOSIS — E119 Type 2 diabetes mellitus without complications: Secondary | ICD-10-CM | POA: Insufficient documentation

## 2015-03-22 DIAGNOSIS — Z87891 Personal history of nicotine dependence: Secondary | ICD-10-CM | POA: Diagnosis not present

## 2015-03-22 DIAGNOSIS — Z794 Long term (current) use of insulin: Secondary | ICD-10-CM | POA: Insufficient documentation

## 2015-03-22 DIAGNOSIS — E039 Hypothyroidism, unspecified: Secondary | ICD-10-CM | POA: Diagnosis not present

## 2015-03-22 DIAGNOSIS — Z791 Long term (current) use of non-steroidal anti-inflammatories (NSAID): Secondary | ICD-10-CM | POA: Insufficient documentation

## 2015-03-22 DIAGNOSIS — M10072 Idiopathic gout, left ankle and foot: Secondary | ICD-10-CM | POA: Diagnosis not present

## 2015-03-22 DIAGNOSIS — M79675 Pain in left toe(s): Secondary | ICD-10-CM | POA: Diagnosis present

## 2015-03-22 DIAGNOSIS — Z79899 Other long term (current) drug therapy: Secondary | ICD-10-CM | POA: Diagnosis not present

## 2015-03-22 LAB — CBG MONITORING, ED: GLUCOSE-CAPILLARY: 381 mg/dL — AB (ref 65–99)

## 2015-03-22 LAB — URIC ACID: URIC ACID, SERUM: 10.6 mg/dL — AB (ref 2.3–6.6)

## 2015-03-22 MED ORDER — DEXAMETHASONE SODIUM PHOSPHATE 10 MG/ML IJ SOLN
10.0000 mg | Freq: Once | INTRAMUSCULAR | Status: AC
  Administered 2015-03-22: 10 mg via INTRAMUSCULAR
  Filled 2015-03-22: qty 1

## 2015-03-22 MED ORDER — COLCHICINE 0.6 MG PO TABS: 0.6000 mg | ORAL_TABLET | Freq: Two times a day (BID) | ORAL | Status: DC

## 2015-03-22 MED ORDER — COLCHICINE 0.6 MG PO TABS
0.6000 mg | ORAL_TABLET | Freq: Once | ORAL | Status: AC
  Administered 2015-03-22: 0.6 mg via ORAL
  Filled 2015-03-22: qty 1

## 2015-03-22 MED ORDER — PREDNISONE 20 MG PO TABS: ORAL_TABLET | ORAL | Status: DC

## 2015-03-22 NOTE — ED Notes (Signed)
Pt reports pain to left foot great toe, states it feels like gout pain she has had in the past.

## 2015-03-22 NOTE — ED Provider Notes (Signed)
CSN: [id number]     Arrival date & time 03/22/15  0057 History   First MD Initiated Contact with Patient 03/22/15 0105     Chief Complaint  Patient presents with  . Toe Pain     (Consider location/radiation/quality/duration/timing/severity/associated sxs/prior Treatment) HPI patient reports she's had gout in the past, the last time was several years ago. She states she had some mild achiness in her left foot earlier today however about 3 hours ago she started getting pain and discomfort in her left MTP joint of her great toe. She states it feels like it's on fire. She states she cannot tolerate a soccer having a blanket to touch her foot. She states she took Aleve and tried some Epsom salts without relief. She denies any fever. She denies any dietary indiscretion that may have caused a gouty flareup.  She reports her CBGs have been in the 300s recently. She was taken off her metformin and started on insulin and that has improved her CBGs into the 120 range.  PCP Dr Moreen Fowler  Past Medical History  Diagnosis Date  . Diabetes mellitus without complication ([place])   . Hypothyroidism   . Kidney stones   . Staghorn kidney stones     right nephrectomy    Past Surgical History  Procedure Laterality Date  . Nephrectomy     Family History  Problem Relation Age of Onset  . Ovarian cancer Paternal Grandmother   . Diabetes Maternal Grandmother   . Diabetes Mother    Social History  Substance Use Topics  . Smoking status: Never Smoker   . Smokeless tobacco: Never Used  . Alcohol Use: No    OB History    No data available     Review of Systems  All other systems reviewed and are negative.     Allergies  Doxycycline; Toradol; Food; and Other  Home Medications   Prior to Admission medications   Medication Sig Start Date End Date Taking? Authorizing Provider  glucose blood test strip 1 each by Other route 2 (two) times daily. And lancets 2/day 01/29/15  Yes Renato Shin, MD   Insulin Glargine (LANTUS SOLOSTAR) 100 UNIT/ML Solostar Pen Inject 10 Units into the skin at bedtime. 01/29/15  Yes Renato Shin, MD  Insulin Pen Needle 32G X 4 MM MISC 1 Device by Does not apply route daily. 01/29/15  Yes Renato Shin, MD  insulin regular (NOVOLIN R,HUMULIN R) 100 units/mL injection Inject 0.1 mLs (10 Units total) into the skin 3 (three) times daily before meals. 01/29/15  Yes Renato Shin, MD  Insulin Syringe-Needle U-100 31G X 5/16" 0.3 ML MISC 1 Device by Does not apply route 3 (three) times daily. 01/29/15  Yes Renato Shin, MD  levothyroxine (SYNTHROID, LEVOTHROID) 112 MCG tablet 2 tabs daily 01/29/15  Yes Renato Shin, MD  lisinopril (PRINIVIL,ZESTRIL) 10 MG tablet Take 10 mg by mouth daily. 09/27/14  Yes Historical Provider, MD  naproxen (NAPROSYN) 250 MG tablet Take by mouth 2 (two) times daily with a meal.   Yes Historical Provider, MD  oxyCODONE-acetaminophen (ROXICET) 5-325 MG tablet Take 1 tablet by mouth every 4 (four) hours as needed for severe pain. 02/23/15  Yes Paulette Blanch, MD  potassium citrate (UROCIT-K) 10 MEQ (1080 MG) SR tablet Take 10 mEq by mouth 4 (four) times daily.   Yes Historical Provider, MD  colchicine 0.6 MG tablet Take 1 tablet (0.6 mg total) by mouth 2 (two) times daily. 03/22/15   Rolland Porter, MD  diazepam (VALIUM) 2 MG tablet Take 1 tablet (2 mg total) by mouth every 8 (eight) hours as needed for muscle spasms. 02/23/15   Paulette Blanch, MD  ibuprofen (ADVIL,MOTRIN) 200 MG tablet Take 400 mg by mouth every 6 (six) hours as needed for mild pain.    Historical Provider, MD  metFORMIN (GLUCOPHAGE-XR) 500 MG 24 hr tablet Take 500 mg by mouth 2 (two) times daily.    Historical Provider, MD  metoprolol tartrate (LOPRESSOR) 25 MG tablet Take 1 tablet (25 mg total) by mouth 2 (two) times daily. 02/18/15   Thayer Headings, MD  predniSONE (DELTASONE) 20 MG tablet Take 2 twice a day for 2 days, then 1 twice a day 3 days, then 1 once a day x 2 days 03/22/15   Rolland Porter, MD  propranolol (INDERAL) 10 MG tablet Take 1 tablet (10 mg total) by mouth 4 (four) times daily as needed. 02/18/15   Thayer Headings, MD   BP 127/90 mmHg  Pulse 106  Temp(Src) 98.1 F (36.7 C) (Oral)  Resp 18  Ht 5\' 4"  (1.626 m)  Wt 257 lb (116.574 kg)  BMI 44.09 kg/m2  SpO2 96%  LMP 01/30/2015 (Exact Date)  Laboratory interpretation all normal except tachycardia  Physical Exam  Constitutional: She is oriented to person, place, and time. She appears well-developed and well-nourished.  Non-toxic appearance. She does not appear ill. No distress.  HENT:  Head: Normocephalic and atraumatic.  Right Ear: External ear normal.  Left Ear: External ear normal.  Nose: Nose normal. No mucosal edema or rhinorrhea.  Eyes: Conjunctivae and EOM are normal.  Neck: Normal range of motion and full passive range of motion without pain.  Pulmonary/Chest: Effort normal. No respiratory distress. She has no rhonchi. She exhibits no crepitus.  Abdominal: Normal appearance.  Musculoskeletal: Normal range of motion. She exhibits edema and tenderness.  Moves all extremities well. Patient noted to have a sling and her left arm. Patient has diffuse swelling with tenderness around her MTP joint of her left great toe. It is tender to palpation in that area.  Neurological: She is alert and oriented to person, place, and time. She has normal strength. No cranial nerve deficit.  Skin: Skin is warm, dry and intact. No rash noted. No erythema. No pallor.  Psychiatric: She has a normal mood and affect. Her speech is normal and behavior is normal. Her mood appears not anxious.  Nursing note and vitals reviewed.   ED Course  Procedures (including critical care time)  Medications  colchicine tablet 0.6 mg (0.6 mg Oral Given 03/22/15 0138)  dexamethasone (DECADRON) injection 10 mg (10 mg Intramuscular Given 03/22/15 0138)   Patient is allergic to Toradol. Although she has diabetes and has been having  problems controlling her blood sugars recently she was given a steroid injection in lieu of the Toradol. Also was given colchicine for her acute gouty flareup.  Recheck at 2:30 AM. Her pain is much improved and she states she even fell asleep for short period of time. We discussed her elevated uric acid level. She can follow-up with her primary care doctor to put her on a uric acid lowering medication once his acute flareup is controlled. I am also going to give her a low purine diet to look at. Because she cannot take nonsteroidal anti-inflammatory drug due to allergies and also because she only has one kidney, she was placed on steroids at a lower dose. She will need to monitor her  blood sugars very closely because they can be affected by the steroids.  Labs Review Results for orders placed or performed during the hospital encounter of 03/22/15  Uric acid  Result Value Ref Range   Uric Acid, Serum 10.6 (H) 2.3 - 6.6 mg/dL  CBG monitoring, ED  Result Value Ref Range   Glucose-Capillary 381 (H) 65 - 99 mg/dL    Laboratory interpretation all normal except hyperglycemia and elevated uric acid    MDM   Final diagnoses:  Acute idiopathic gout involving toe of left foot   New Prescriptions   COLCHICINE 0.6 MG TABLET    Take 1 tablet (0.6 mg total) by mouth 2 (two) times daily.   PREDNISONE (DELTASONE) 20 MG TABLET    Take 2 twice a day for 2 days, then 1 twice a day 3 days, then 1 once a day x 2 days    Plan discharge  Rolland Porter, MD, Barbette Or, MD 03/22/15 [phone]

## 2015-03-22 NOTE — Discharge Instructions (Signed)
Look at the low purine diet. Also watch your diabetic diet while on the prednisone, it will make your blood sugar increase. Take the medications as prescribed. Your uric acid level was 10.6 which is high. Please let Dr Laqueta Linden office now, he may want to start you on a uric acid lowering medication once this acute flare up is improving.     Low-Purine Diet Purines are compounds that affect the level of uric acid in your body. A low-purine diet is a diet that is low in purines. Eating a low-purine diet can prevent the level of uric acid in your body from getting too high and causing gout or kidney stones or both. WHAT DO I NEED TO KNOW ABOUT THIS DIET?  Choose low-purine foods. Examples of low-purine foods are listed in the next section.  Drink plenty of fluids, especially water. Fluids can help remove uric acid from your body. Try to drink 8-16 cups (1.9-3.8 L) a day.  Limit foods high in fat, especially saturated fat, as fat makes it harder for the body to get rid of uric acid. Foods high in saturated fat include pizza, cheese, ice cream, whole milk, fried foods, and gravies. Choose foods that are lower in fat and lean sources of protein. Use olive oil when cooking as it contains healthy fats that are not high in saturated fat.  Limit alcohol. Alcohol interferes with the elimination of uric acid from your body. If you are having a gout attack, avoid all alcohol.  Keep in mind that different people's bodies react differently to different foods. You will probably learn over time which foods do or do not affect you. If you discover that a food tends to cause your gout to flare up, avoid eating that food. You can more freely enjoy foods that do not cause problems. If you have any questions about a food item, talk to your dietitian or health care provider. WHICH FOODS ARE LOW, MODERATE, AND HIGH IN PURINES? The following is a list of foods that are low, moderate, and high in purines. You can eat any  amount of the foods that are low in purines. You may be able to have small amounts of foods that are moderate in purines. Ask your health care provider how much of a food moderate in purines you can have. Avoid foods high in purines. Grains  Foods low in purines: Enriched white bread, pasta, rice, cake, cornbread, popcorn.  Foods moderate in purines: Whole-grain breads and cereals, wheat germ, bran, oatmeal. Uncooked oatmeal. Dry wheat bran or wheat germ.  Foods high in purines: Pancakes, [place] toast, biscuits, muffins. Vegetables  Foods low in purines: All vegetables, except those that are moderate in purines.  Foods moderate in purines: Asparagus, cauliflower, spinach, mushrooms, green peas. Fruits  All fruits are low in purines. Meats and other Protein Foods  Foods low in purines: Eggs, nuts, peanut butter.  Foods moderate in purines: 80-90% lean beef, lamb, veal, pork, poultry, fish, eggs, peanut butter, nuts. Crab, lobster, oysters, and shrimp. Cooked dried beans, peas, and lentils.  Foods high in purines: Anchovies, sardines, herring, mussels, tuna, codfish, scallops, trout, and haddock. Berniece Salines. Organ meats (such as liver or kidney). Tripe. Game meat. Goose. Sweetbreads. Dairy  All dairy foods are low in purines. Low-fat and fat-free dairy products are best because they are low in saturated fat. Beverages  Drinks low in purines: Water, carbonated beverages, tea, coffee, cocoa.  Drinks moderate in purines: Soft drinks and other drinks sweetened with  high-fructose corn syrup. Juices. To find whether a food or drink is sweetened with high-fructose corn syrup, look at the ingredients list.  Drinks high in purines: Alcoholic beverages (such as beer). Condiments  Foods low in purines: Salt, herbs, olives, pickles, relishes, vinegar.  Foods moderate in purines: Butter, margarine, oils, mayonnaise. Fats and Oils  Foods low in purines: All types, except gravies and sauces made  with meat.  Foods high in purines: Gravies and sauces made with meat. Other Foods  Foods low in purines: Sugars, sweets, gelatin. Cake. Soups made without meat.  Foods moderate in purines: Meat-based or fish-based soups, broths, or bouillons. Foods and drinks sweetened with high-fructose corn syrup.  Foods high in purines: High-fat desserts (such as ice cream, cookies, cakes, pies, doughnuts, and chocolate). Contact your dietitian for more information on foods that are not listed here.   This information is not intended to replace advice given to you by your health care provider. Make sure you discuss any questions you have with your health care provider.   Document Released: 07/18/2010 Document Revised: 03/28/2013 Document Reviewed: 02/27/2013 Elsevier Interactive Patient Education 2016 [employer].  Gout Gout is an inflammatory arthritis caused by a buildup of uric acid crystals in the joints. Uric acid is a chemical that is normally present in the blood. When the level of uric acid in the blood is too high it can form crystals that deposit in your joints and tissues. This causes joint redness, soreness, and swelling (inflammation). Repeat attacks are common. Over time, uric acid crystals can form into masses (tophi) near a joint, destroying bone and causing disfigurement. Gout is treatable and often preventable. CAUSES  The disease begins with elevated levels of uric acid in the blood. Uric acid is produced by your body when it breaks down a naturally found substance called purines. Certain foods you eat, such as meats and fish, contain high amounts of purines. Causes of an elevated uric acid level include:  Being passed down from parent to child (heredity).  Diseases that cause increased uric acid production (such as obesity, psoriasis, and certain cancers).  Excessive alcohol use.  Diet, especially diets rich in meat and seafood.  Medicines, including certain cancer-fighting  medicines (chemotherapy), water pills (diuretics), and aspirin.  Chronic kidney disease. The kidneys are no longer able to remove uric acid well.  Problems with metabolism. Conditions strongly associated with gout include:  Obesity.  High blood pressure.  High cholesterol.  Diabetes. Not everyone with elevated uric acid levels gets gout. It is not understood why some people get gout and others do not. Surgery, joint injury, and eating too much of certain foods are some of the factors that can lead to gout attacks. SYMPTOMS   An attack of gout comes on quickly. It causes intense pain with redness, swelling, and warmth in a joint.  Fever can occur.  Often, only one joint is involved. Certain joints are more commonly involved:  Base of the big toe.  Knee.  Ankle.  Wrist.  Finger. Without treatment, an attack usually goes away in a few days to weeks. Between attacks, you usually will not have symptoms, which is different from many other forms of arthritis. DIAGNOSIS  Your caregiver will suspect gout based on your symptoms and exam. In some cases, tests may be recommended. The tests may include:  Blood tests.  Urine tests.  X-rays.  Joint fluid exam. This exam requires a needle to remove fluid from the joint (arthrocentesis). Using a  microscope, gout is confirmed when uric acid crystals are seen in the joint fluid. TREATMENT  There are two phases to gout treatment: treating the sudden onset (acute) attack and preventing attacks (prophylaxis).  Treatment of an Acute Attack.  Medicines are used. These include anti-inflammatory medicines or steroid medicines.  An injection of steroid medicine into the affected joint is sometimes necessary.  The painful joint is rested. Movement can worsen the arthritis.  You may use warm or cold treatments on painful joints, depending which works best for you.  Treatment to Prevent Attacks.  If you suffer from frequent gout attacks,  your caregiver may advise preventive medicine. These medicines are started after the acute attack subsides. These medicines either help your kidneys eliminate uric acid from your body or decrease your uric acid production. You may need to stay on these medicines for a very long time.  The early phase of treatment with preventive medicine can be associated with an increase in acute gout attacks. For this reason, during the first few months of treatment, your caregiver may also advise you to take medicines usually used for acute gout treatment. Be sure you understand your caregiver's directions. Your caregiver may make several adjustments to your medicine dose before these medicines are effective.  Discuss dietary treatment with your caregiver or dietitian. Alcohol and drinks high in sugar and fructose and foods such as meat, poultry, and seafood can increase uric acid levels. Your caregiver or dietitian can advise you on drinks and foods that should be limited. HOME CARE INSTRUCTIONS   Do not take aspirin to relieve pain. This raises uric acid levels.  Only take over-the-counter or prescription medicines for pain, discomfort, or fever as directed by your caregiver.  Rest the joint as much as possible. When in bed, keep sheets and blankets off painful areas.  Keep the affected joint raised (elevated).  Apply warm or cold treatments to painful joints. Use of warm or cold treatments depends on which works best for you.  Use crutches if the painful joint is in your leg.  Drink enough fluids to keep your urine clear or pale yellow. This helps your body get rid of uric acid. Limit alcohol, sugary drinks, and fructose drinks.  Follow your dietary instructions. Pay careful attention to the amount of protein you eat. Your daily diet should emphasize fruits, vegetables, whole grains, and fat-free or low-fat milk products. Discuss the use of coffee, vitamin C, and cherries with your caregiver or dietitian.  These may be helpful in lowering uric acid levels.  Maintain a healthy body weight. SEEK MEDICAL CARE IF:   You develop diarrhea, vomiting, or any side effects from medicines.  You do not feel better in 24 hours, or you are getting worse. SEEK IMMEDIATE MEDICAL CARE IF:   Your joint becomes suddenly more tender, and you have chills or a fever. MAKE SURE YOU:   Understand these instructions.  Will watch your condition.  Will get help right away if you are not doing well or get worse.   This information is not intended to replace advice given to you by your health care provider. Make sure you discuss any questions you have with your health care provider.   Document Released: 03/20/2000 Document Revised: 04/13/2014 Document Reviewed: 11/04/2011 Elsevier Interactive Patient Education [employer].

## 2015-03-22 NOTE — ED Notes (Signed)
Patient verbalizes understanding of discharge instructions, prescription medications, home care and follow up care. Patient ambulatory out of department at this time. 

## 2015-03-26 ENCOUNTER — Ambulatory Visit (HOSPITAL_COMMUNITY): Payer: Self-pay | Admitting: Psychology

## 2015-03-27 ENCOUNTER — Telehealth: Payer: Self-pay | Admitting: Cardiovascular Disease

## 2015-03-27 ENCOUNTER — Encounter: Payer: Self-pay | Admitting: Endocrinology

## 2015-03-27 ENCOUNTER — Ambulatory Visit (INDEPENDENT_AMBULATORY_CARE_PROVIDER_SITE_OTHER): Payer: BLUE CROSS/BLUE SHIELD | Admitting: Endocrinology

## 2015-03-27 VITALS — BP 127/88 | HR 110 | Temp 97.7°F | Ht 64.0 in | Wt 259.0 lb

## 2015-03-27 DIAGNOSIS — E119 Type 2 diabetes mellitus without complications: Secondary | ICD-10-CM

## 2015-03-27 LAB — POCT GLYCOSYLATED HEMOGLOBIN (HGB A1C): HEMOGLOBIN A1C: 11.5

## 2015-03-27 MED ORDER — INSULIN GLARGINE 100 UNIT/ML SOLOSTAR PEN: 20.0000 [IU] | PEN_INJECTOR | Freq: Every day | SUBCUTANEOUS | Status: DC

## 2015-03-27 NOTE — Patient Instructions (Addendum)
Please increase lantus to 20 units at bedtime. and: Continue the regular, 10 units 3 times a day (just before each meal). The effect of the prednisone wears off 1 week after you are finished with it.   Until then, take extra reg insulin: 3 units for any blood sugar is the 200's, and 5 units for any over 300.   Please come back for a follow-up appointment in 6 weeks.

## 2015-03-27 NOTE — Telephone Encounter (Signed)
F/u  Pt returning RN phone call. Please call back and discuss.   

## 2015-03-27 NOTE — Progress Notes (Signed)
Subjective:    Patient ID: Alison Fitzgerald, female    DOB: 04/26/1980, 34 y.o.   MRN: [id number]  HPI Pt returns for f/u of diabetes mellitus: DM type: Insulin-requiring type 2 Dx'ed: 0000000 Complications: none.   Therapy: insulin since dx.   GDM: 2007 DKA: never Severe hypoglycemia: never Pancreatitis: 3 episodes (most recent was 2015; no cause was determined). Other: she takes multiple daily injections; she has had TL.   Interval history: she is on prednisone for gout.  Prior to this, cbg's were 150-200's.  There is no trend throughout the day.   Past Medical History  Diagnosis Date  . Diabetes mellitus without complication ([place])   . Hypothyroidism   . Kidney stones   . Staghorn kidney stones     right nephrectomy     Past Surgical History  Procedure Laterality Date  . Nephrectomy      Social History   Social History  . Marital Status: Married    Spouse Name: N/A  . Number of Children: N/A  . Years of Education: N/A   Occupational History  . Not on file.   Social History Main Topics  . Smoking status: Never Smoker   . Smokeless tobacco: Never Used  . Alcohol Use: No  . Drug Use: No  . Sexual Activity: Not on file   Other Topics Concern  . Not on file   Social History Narrative    Current Outpatient Prescriptions on File Prior to Visit  Medication Sig Dispense Refill  . colchicine 0.6 MG tablet Take 1 tablet (0.6 mg total) by mouth 2 (two) times daily. 30 tablet 0  . diazepam (VALIUM) 2 MG tablet Take 1 tablet (2 mg total) by mouth every 8 (eight) hours as needed for muscle spasms. 15 tablet 0  . ibuprofen (ADVIL,MOTRIN) 200 MG tablet Take 400 mg by mouth every 6 (six) hours as needed for mild pain.    . Insulin Pen Needle 32G X 4 MM MISC 1 Device by Does not apply route daily. 50 each 11  . insulin regular (NOVOLIN R,HUMULIN R) 100 units/mL injection Inject 0.1 mLs (10 Units total) into the skin 3 (three) times daily before meals. 30 mL 3  .  Insulin Syringe-Needle U-100 31G X 5/16" 0.3 ML MISC 1 Device by Does not apply route 3 (three) times daily. 100 each 3  . levothyroxine (SYNTHROID, LEVOTHROID) 112 MCG tablet 2 tabs daily 100 tablet 5  . lisinopril (PRINIVIL,ZESTRIL) 10 MG tablet Take 10 mg by mouth daily.  5  . metFORMIN (GLUCOPHAGE-XR) 500 MG 24 hr tablet Take 500 mg by mouth 2 (two) times daily.    . metoprolol tartrate (LOPRESSOR) 25 MG tablet Take 1 tablet (25 mg total) by mouth 2 (two) times daily. 180 tablet 3  . naproxen (NAPROSYN) 250 MG tablet Take by mouth 2 (two) times daily with a meal.    . oxyCODONE-acetaminophen (ROXICET) 5-325 MG tablet Take 1 tablet by mouth every 4 (four) hours as needed for severe pain. 15 tablet 0  . potassium citrate (UROCIT-K) 10 MEQ (1080 MG) SR tablet Take 10 mEq by mouth 4 (four) times daily.    . predniSONE (DELTASONE) 20 MG tablet Take 2 twice a day for 2 days, then 1 twice a day 3 days, then 1 once a day x 2 days 16 tablet 0  . propranolol (INDERAL) 10 MG tablet Take 1 tablet (10 mg total) by mouth 4 (four) times daily as needed. 60 tablet 11  No current facility-administered medications on file prior to visit.    Allergies  Allergen Reactions  . Doxycycline Anaphylaxis  . Toradol [Ketorolac Tromethamine] Hives  . Food Swelling    Jalapeno seeds  . Other     thyola- bottomed out blood pressure    Family History  Problem Relation Age of Onset  . Ovarian cancer Paternal Grandmother   . Diabetes Maternal Grandmother   . Diabetes Mother     BP 127/88 mmHg  Pulse 110  Temp(Src) 97.7 F (36.5 C) (Oral)  Ht 5\' 4"  (1.626 m)  Wt 259 lb (117.482 kg)  BMI 44.44 kg/m2  SpO2 97%   Review of Systems She denies hypoglycemia.    Objective:   Physical Exam VITAL SIGNS:  See vs page GENERAL: no distress SKIN:  Insulin injection sites at the anterior abdomen are normal     Assessment & Plan:  DM: she needs increased rx, even after she is done with prednisone.    Patient is advised the following: Patient Instructions  Please increase lantus to 20 units at bedtime. and: Continue the regular, 10 units 3 times a day (just before each meal). The effect of the prednisone wears off 1 week after you are finished with it.   Until then, take extra reg insulin: 3 units for any blood sugar is the 200's, and 5 units for any over 300.   Please come back for a follow-up appointment in 6 weeks.

## 2015-03-27 NOTE — Telephone Encounter (Signed)
Informed pt of monitor results. Pt verbalized understanding. 

## 2015-03-28 ENCOUNTER — Telehealth: Payer: Self-pay | Admitting: Endocrinology

## 2015-03-28 MED ORDER — GLUCOSE BLOOD VI STRP: 1.0000 | ORAL_STRIP | Freq: Two times a day (BID) | Status: DC

## 2015-03-28 NOTE — Telephone Encounter (Signed)
Rx submitted per pt's request.  

## 2015-03-28 NOTE — Telephone Encounter (Signed)
Pt needs [place] to call in test strips for her freestyle meter please call into [facility]

## 2015-04-03 ENCOUNTER — Ambulatory Visit (INDEPENDENT_AMBULATORY_CARE_PROVIDER_SITE_OTHER): Payer: BLUE CROSS/BLUE SHIELD | Admitting: Urology

## 2015-04-03 DIAGNOSIS — N200 Calculus of kidney: Secondary | ICD-10-CM

## 2015-04-03 DIAGNOSIS — E7201 Cystinuria: Secondary | ICD-10-CM | POA: Diagnosis not present

## 2015-04-05 ENCOUNTER — Other Ambulatory Visit: Payer: Self-pay | Admitting: Endocrinology

## 2015-04-05 ENCOUNTER — Telehealth: Payer: Self-pay | Admitting: Endocrinology

## 2015-04-05 MED ORDER — BAYER MICROLET LANCETS MISC
Status: DC
Start: 2015-04-05 — End: 2015-04-12

## 2015-04-05 MED ORDER — BAYER CONTOUR NEXT MONITOR W/DEVICE KIT
PACK | Status: DC
Start: 2015-04-05 — End: 2015-04-12

## 2015-04-05 MED ORDER — INSULIN PEN NEEDLE 32G X 4 MM MISC: Status: DC

## 2015-04-05 MED ORDER — GLUCOSE BLOOD VI STRP: ORAL_STRIP | Status: DC

## 2015-04-05 NOTE — Telephone Encounter (Signed)
Patient called stating that she went to her pharmacy to refill her test strips and insurance had declined the Rx  She then contacted her insurance company and they advised her that the only meter they will cover is the [employer]   Also, she will need pen needles sent in as well   Rx: Engineer, petroleum strips  Lancets  Pen needles   Pharmacy: [facility]   Thank you

## 2015-04-12 ENCOUNTER — Other Ambulatory Visit: Payer: Self-pay

## 2015-04-12 MED ORDER — GLUCOSE BLOOD VI STRP
ORAL_STRIP | Status: DC
Start: 2015-04-12 — End: 2016-07-10

## 2015-04-12 MED ORDER — BAYER CONTOUR NEXT MONITOR W/DEVICE KIT: PACK | Status: DC

## 2015-04-12 MED ORDER — BAYER MICROLET LANCETS MISC: Status: DC

## 2015-04-30 ENCOUNTER — Other Ambulatory Visit (HOSPITAL_COMMUNITY): Payer: Self-pay | Admitting: General Surgery

## 2015-05-08 ENCOUNTER — Ambulatory Visit: Payer: Self-pay | Admitting: Endocrinology

## 2015-05-08 ENCOUNTER — Other Ambulatory Visit: Payer: Self-pay | Admitting: Urology

## 2015-05-08 ENCOUNTER — Ambulatory Visit (INDEPENDENT_AMBULATORY_CARE_PROVIDER_SITE_OTHER): Payer: BLUE CROSS/BLUE SHIELD | Admitting: Urology

## 2015-05-08 DIAGNOSIS — N200 Calculus of kidney: Secondary | ICD-10-CM | POA: Diagnosis not present

## 2015-05-08 DIAGNOSIS — E7201 Cystinuria: Secondary | ICD-10-CM

## 2015-05-15 ENCOUNTER — Ambulatory Visit: Payer: Self-pay | Admitting: Psychology

## 2015-05-21 ENCOUNTER — Emergency Department (HOSPITAL_COMMUNITY): Payer: BLUE CROSS/BLUE SHIELD

## 2015-05-21 ENCOUNTER — Encounter (HOSPITAL_COMMUNITY): Payer: Self-pay | Admitting: Emergency Medicine

## 2015-05-21 ENCOUNTER — Ambulatory Visit: Payer: BLUE CROSS/BLUE SHIELD | Admitting: Cardiovascular Disease

## 2015-05-21 ENCOUNTER — Emergency Department (HOSPITAL_COMMUNITY)
Admission: EM | Admit: 2015-05-21 | Discharge: 2015-05-21 | Disposition: A | Discharge status: Home / Self Care | Payer: BLUE CROSS/BLUE SHIELD | Attending: Emergency Medicine | Admitting: Emergency Medicine

## 2015-05-21 DIAGNOSIS — Z792 Long term (current) use of antibiotics: Secondary | ICD-10-CM | POA: Insufficient documentation

## 2015-05-21 DIAGNOSIS — N12 Tubulo-interstitial nephritis, not specified as acute or chronic: Secondary | ICD-10-CM | POA: Diagnosis not present

## 2015-05-21 DIAGNOSIS — Z794 Long term (current) use of insulin: Secondary | ICD-10-CM | POA: Insufficient documentation

## 2015-05-21 DIAGNOSIS — Z87442 Personal history of urinary calculi: Secondary | ICD-10-CM | POA: Diagnosis not present

## 2015-05-21 DIAGNOSIS — E039 Hypothyroidism, unspecified: Secondary | ICD-10-CM | POA: Insufficient documentation

## 2015-05-21 DIAGNOSIS — E119 Type 2 diabetes mellitus without complications: Secondary | ICD-10-CM | POA: Diagnosis not present

## 2015-05-21 DIAGNOSIS — Z79899 Other long term (current) drug therapy: Secondary | ICD-10-CM | POA: Insufficient documentation

## 2015-05-21 DIAGNOSIS — R109 Unspecified abdominal pain: Secondary | ICD-10-CM

## 2015-05-21 DIAGNOSIS — Z3202 Encounter for pregnancy test, result negative: Secondary | ICD-10-CM | POA: Diagnosis not present

## 2015-05-21 LAB — URINALYSIS, ROUTINE W REFLEX MICROSCOPIC
BILIRUBIN URINE: NEGATIVE
Glucose, UA: 500 mg/dL — AB
Ketones, ur: NEGATIVE mg/dL
NITRITE: NEGATIVE
Specific Gravity, Urine: 1.024 (ref 1.005–1.030)
pH: 6 (ref 5.0–8.0)

## 2015-05-21 LAB — PREGNANCY, URINE: PREG TEST UR: NEGATIVE

## 2015-05-21 LAB — BASIC METABOLIC PANEL
Anion gap: 13 (ref 5–15)
BUN: 14 mg/dL (ref 6–20)
CO2: 23 mmol/L (ref 22–32)
CREATININE: 0.79 mg/dL (ref 0.44–1.00)
Calcium: 9.6 mg/dL (ref 8.9–10.3)
Chloride: 103 mmol/L (ref 101–111)
Glucose, Bld: 187 mg/dL — ABNORMAL HIGH (ref 65–99)
POTASSIUM: 4.7 mmol/L (ref 3.5–5.1)
SODIUM: 139 mmol/L (ref 135–145)

## 2015-05-21 LAB — CBC WITH DIFFERENTIAL/PLATELET
BASOS PCT: 0 %
Basophils Absolute: 0 10*3/uL (ref 0.0–0.1)
EOS ABS: 0.3 10*3/uL (ref 0.0–0.7)
EOS PCT: 4 %
HCT: 42.8 % (ref 36.0–46.0)
Hemoglobin: 14.4 g/dL (ref 12.0–15.0)
LYMPHS ABS: 3 10*3/uL (ref 0.7–4.0)
Lymphocytes Relative: 41 %
MCH: 31.1 pg (ref 26.0–34.0)
MCHC: 33.6 g/dL (ref 30.0–36.0)
MCV: 92.4 fL (ref 78.0–100.0)
Monocytes Absolute: 0.4 10*3/uL (ref 0.1–1.0)
Monocytes Relative: 5 %
Neutro Abs: 3.7 10*3/uL (ref 1.7–7.7)
Neutrophils Relative %: 50 %
PLATELETS: 305 10*3/uL (ref 150–400)
RBC: 4.63 MIL/uL (ref 3.87–5.11)
RDW: 13.4 % (ref 11.5–15.5)
WBC: 7.3 10*3/uL (ref 4.0–10.5)

## 2015-05-21 LAB — URINE MICROSCOPIC-ADD ON

## 2015-05-21 MED ORDER — ONDANSETRON HCL 4 MG/2ML IJ SOLN
4.0000 mg | Freq: Once | INTRAMUSCULAR | Status: AC
  Administered 2015-05-21: 4 mg via INTRAVENOUS
  Filled 2015-05-21: qty 2

## 2015-05-21 MED ORDER — ONDANSETRON 4 MG PO TBDP: 4.0000 mg | ORAL_TABLET | Freq: Three times a day (TID) | ORAL | Status: DC | PRN

## 2015-05-21 MED ORDER — HYDROMORPHONE HCL 1 MG/ML IJ SOLN
1.0000 mg | Freq: Once | INTRAMUSCULAR | Status: AC
  Administered 2015-05-21: 1 mg via INTRAVENOUS
  Filled 2015-05-21: qty 1

## 2015-05-21 MED ORDER — OXYCODONE-ACETAMINOPHEN 5-325 MG PO TABS
1.0000 | ORAL_TABLET | Freq: Once | ORAL | Status: AC
  Administered 2015-05-21: 1 via ORAL
  Filled 2015-05-21: qty 1

## 2015-05-21 MED ORDER — CEPHALEXIN 500 MG PO CAPS: 500.0000 mg | ORAL_CAPSULE | Freq: Three times a day (TID) | ORAL | Status: AC

## 2015-05-21 MED ORDER — SODIUM CHLORIDE 0.9 % IV BOLUS (SEPSIS)
1000.0000 mL | Freq: Once | INTRAVENOUS | Status: AC
  Administered 2015-05-21: 1000 mL via INTRAVENOUS

## 2015-05-21 MED ORDER — TRAMADOL HCL 50 MG PO TABS: 50.0000 mg | ORAL_TABLET | Freq: Four times a day (QID) | ORAL | Status: DC | PRN

## 2015-05-21 NOTE — Discharge Instructions (Signed)
Flank Pain °Flank pain refers to pain that is located on the side of the body between the upper abdomen and the back. The pain may occur over a short period of time (acute) or may be long-term or reoccurring (chronic). It may be mild or severe. Flank pain can be caused by many things. °CAUSES  °Some of the more common causes of flank pain include: °· Muscle strains.   °· Muscle spasms.   °· A disease of your spine (vertebral disk disease).   °· A lung infection (pneumonia).   °· Fluid around your lungs (pulmonary edema).   °· A kidney infection.   °· Kidney stones.   °· A very painful skin rash caused by the chickenpox virus (shingles).   °· Gallbladder disease.   °HOME CARE INSTRUCTIONS  °Home care will depend on the cause of your pain. In general, °· Rest as directed by your caregiver. °· Drink enough fluids to keep your urine clear or pale yellow. °· Only take over-the-counter or prescription medicines as directed by your caregiver. Some medicines may help relieve the pain. °· Tell your caregiver about any changes in your pain. °· Follow up with your caregiver as directed. °SEEK IMMEDIATE MEDICAL CARE IF:  °· Your pain is not controlled with medicine.   °· You have new or worsening symptoms. °· Your pain increases.   °· You have abdominal pain.   °· You have shortness of breath.   °· You have persistent nausea or vomiting.   °· You have swelling in your abdomen.   °· You feel faint or pass out.   °· You have blood in your urine. °· You have a fever or persistent symptoms for more than 2-3 days. °· You have a fever and your symptoms suddenly get worse. °MAKE SURE YOU:  °· Understand these instructions. °· Will watch your condition. °· Will get help right away if you are not doing well or get worse. °  °This information is not intended to replace advice given to you by your health care provider. Make sure you discuss any questions you have with your health care provider. °  °Document Released: 05/14/2005 Document  Revised: 12/16/2011 Document Reviewed: 11/05/2011 °Elsevier Interactive Patient Education ©2016 [employer] Inc. ° °

## 2015-05-21 NOTE — ED Notes (Signed)
Pt instructed to wait for a doctor and not to drive for 4-6 hrs.

## 2015-05-21 NOTE — ED Notes (Signed)
Bed: WA21 Expected date:  Expected time:  Means of arrival:  Comments: Hold for maintenance

## 2015-05-21 NOTE — ED Provider Notes (Signed)
CSN: [id number]     Arrival date & time 05/21/15  0908 History   First MD Initiated Contact with Patient 05/21/15 1114     Chief Complaint  Patient presents with  . Flank Pain     (Consider location/radiation/quality/duration/timing/severity/associated sxs/prior Treatment) HPI Comments: Passed stone fragment 330AM, pain progressively worsening L side of back radiates down and around   Patient is a 35 y.o. female presenting with flank pain.  Flank Pain This is a new problem. Episode onset: last night. The problem occurs constantly. The problem has been gradually worsening. Pertinent negatives include no chest pain, no abdominal pain, no headaches and no shortness of breath. Relieved by: pain medication initially received.   Dr. Alyson Ingles at [facility], called [place] office said he wasn't there  Past Medical History  Diagnosis Date  . Diabetes mellitus without complication ([place])   . Hypothyroidism   . Kidney stones   . Staghorn kidney stones     right nephrectomy    Past Surgical History  Procedure Laterality Date  . Nephrectomy     Family History  Problem Relation Age of Onset  . Ovarian cancer Paternal Grandmother   . Diabetes Maternal Grandmother   . Diabetes Mother    Social History  Substance Use Topics  . Smoking status: Never Smoker   . Smokeless tobacco: Never Used  . Alcohol Use: No   OB History    No data available     Review of Systems  Constitutional: Negative for fever.  HENT: Negative for sore throat.   Eyes: Negative for visual disturbance.  Respiratory: Negative for cough and shortness of breath.   Cardiovascular: Negative for chest pain.  Gastrointestinal: Positive for nausea and vomiting (5 times). Negative for abdominal pain, diarrhea and constipation.  Genitourinary: Positive for flank pain and difficulty urinating (feel like need to but nothing coming out). Negative for dysuria, hematuria, vaginal bleeding and vaginal discharge.   Musculoskeletal: Negative for back pain and neck pain.  Skin: Negative for rash.  Neurological: Negative for syncope and headaches.      Allergies  Doxycycline; Toradol; Food; Indomethacin; Other; and Prednisone  Home Medications   Prior to Admission medications   Medication Sig Start Date End Date Taking? Authorizing Provider  allopurinol (ZYLOPRIM) 100 MG tablet Take 100 mg by mouth daily. 04/15/15  Yes Historical Provider, MD  captopril (CAPOTEN) 25 MG tablet Take 25 mg by mouth 2 (two) times daily. 04/05/15  Yes Historical Provider, MD  colchicine 0.6 MG tablet Take 1 tablet (0.6 mg total) by mouth 2 (two) times daily. 03/22/15  Yes Rolland Porter, MD  cyclobenzaprine (FLEXERIL) 5 MG tablet Take 5 mg by mouth 2 (two) times daily as needed for muscle spasms.   Yes Historical Provider, MD  HYDROcodone-acetaminophen (NORCO/VICODIN) 5-325 MG tablet Take 1-2 tablets by mouth every 4 (four) hours as needed. 03/13/15  Yes Historical Provider, MD  ibuprofen (ADVIL,MOTRIN) 200 MG tablet Take 400 mg by mouth every 6 (six) hours as needed for mild pain.   Yes Historical Provider, MD  Insulin Glargine (LANTUS SOLOSTAR) 100 UNIT/ML Solostar Pen Inject 20 Units into the skin daily at 10 pm. 04/05/15  Yes Renato Shin, MD  insulin regular (NOVOLIN R,HUMULIN R) 100 units/mL injection Inject 0.1 mLs (10 Units total) into the skin 3 (three) times daily before meals. Patient taking differently: Inject 10 Units into the skin 3 (three) times daily before meals. Additional 5 units if bs >300 01/29/15  Yes Renato Shin, MD  levothyroxine (  SYNTHROID, LEVOTHROID) 112 MCG tablet 2 tabs daily 01/29/15  Yes Renato Shin, MD  potassium citrate (UROCIT-K) 10 MEQ (1080 MG) SR tablet Take 10 mEq by mouth 4 (four) times daily.   Yes Historical Provider, MD  BAYER MICROLET LANCETS lancets Use to check blood sugar 1 time per day. 04/12/15   Renato Shin, MD  Blood Glucose Monitoring Suppl (BAYER CONTOUR NEXT MONITOR) w/Device  KIT Use to check blood sugar 1 time per day. 04/12/15   Renato Shin, MD  cephALEXin (KEFLEX) 500 MG capsule Take 1 capsule (500 mg total) by mouth 3 (three) times daily. 05/21/15 06/04/15  Gareth Morgan, MD  diazepam (VALIUM) 2 MG tablet Take 1 tablet (2 mg total) by mouth every 8 (eight) hours as needed for muscle spasms. 02/23/15   Paulette Blanch, MD  glucose blood (BAYER CONTOUR NEXT TEST) test strip Use to check blood sugar 1 time per day. 04/12/15   Renato Shin, MD  Insulin Pen Needle 32G X 4 MM MISC Use to inject insulin 4 times daily as instructed. 04/05/15   Renato Shin, MD  Insulin Syringe-Needle U-100 31G X 5/16" 0.3 ML MISC 1 Device by Does not apply route 3 (three) times daily. 01/29/15   Renato Shin, MD  metoprolol tartrate (LOPRESSOR) 25 MG tablet Take 1 tablet (25 mg total) by mouth 2 (two) times daily. 02/18/15   Thayer Headings, MD  ondansetron (ZOFRAN ODT) 4 MG disintegrating tablet Take 1 tablet (4 mg total) by mouth every 8 (eight) hours as needed for nausea or vomiting. 05/21/15   Gareth Morgan, MD  oxyCODONE-acetaminophen (ROXICET) 5-325 MG tablet Take 1 tablet by mouth every 4 (four) hours as needed for severe pain. 02/23/15   Paulette Blanch, MD  propranolol (INDERAL) 10 MG tablet Take 1 tablet (10 mg total) by mouth 4 (four) times daily as needed. 02/18/15   Thayer Headings, MD  traMADol (ULTRAM) 50 MG tablet Take 1 tablet (50 mg total) by mouth every 6 (six) hours as needed. 05/21/15   Gareth Morgan, MD   BP 126/79 mmHg  Pulse 104  Temp(Src) 98.7 F (37.1 C) (Oral)  Resp 16  SpO2 94%  LMP 05/15/2015 Physical Exam  Constitutional: She is oriented to person, place, and time. She appears well-developed and well-nourished. No distress.  HENT:  Head: Normocephalic and atraumatic.  Eyes: Conjunctivae and EOM are normal.  Neck: Normal range of motion.  Cardiovascular: Normal rate, regular rhythm, normal heart sounds and intact distal pulses.  Exam reveals no gallop and no  friction rub.   No murmur heard. Pulmonary/Chest: Effort normal and breath sounds normal. No respiratory distress. She has no wheezes. She has no rales.  Abdominal: Soft. She exhibits no distension. There is tenderness in the left lower quadrant. There is CVA tenderness. There is no guarding.  Musculoskeletal: She exhibits no edema or tenderness.  Neurological: She is alert and oriented to person, place, and time.  Skin: Skin is warm and dry. No rash noted. She is not diaphoretic. No erythema.  Nursing note and vitals reviewed.   ED Course  Procedures (including critical care time) Labs Review Labs Reviewed  BASIC METABOLIC PANEL - Abnormal; Notable for the following:    Glucose, Bld 187 (*)    All other components within normal limits  URINALYSIS, ROUTINE W REFLEX MICROSCOPIC (NOT AT [facility]) - Abnormal; Notable for the following:    APPearance CLOUDY (*)    Glucose, UA 500 (*)    Hgb urine  dipstick SMALL (*)    Protein, ur >300 (*)    Leukocytes, UA SMALL (*)    All other components within normal limits  URINE MICROSCOPIC-ADD ON - Abnormal; Notable for the following:    Squamous Epithelial / LPF 6-30 (*)    Bacteria, UA FEW (*)    All other components within normal limits  URINE CULTURE  CBC WITH DIFFERENTIAL/PLATELET  PREGNANCY, URINE    Imaging Review Ct Renal Stone Study  05/21/2015  CLINICAL DATA:  LEFT side pain beginning at 1800 hours last night, passed a stone fragment last night but is not able to get a full stream of urine, vomiting, single kidney post RIGHT nephrectomy for stone disease, diabetes mellitus EXAM: CT ABDOMEN AND PELVIS WITHOUT CONTRAST TECHNIQUE: Multidetector CT imaging of the abdomen and pelvis was performed following the standard protocol without IV contrast. Sagittal and coronal MPR images reconstructed from axial data set. Oral contrast not administered for this indication. COMPARISON:  08/09/2014 FINDINGS: Lung bases clear. Tiny nonobstructing LEFT  renal calculi. No LEFT renal mass, hydronephrosis, ureteral dilatation or ureteral calcification. Unremarkable bladder. Surgical absence of RIGHT kidney. Low-attenuation RIGHT adrenal mass 3.9 x 5.1 cm compatible with adenoma, stable. Liver, spleen, pancreas, gallbladder and LEFT adrenal gland normal. Scattered normal size retroperitoneal nodes without adenopathy. Unremarkable uterus, adnexa, and appendix. Stomach and bowel loops normal appearance. No mass, adenopathy, free air, free fluid, or inflammatory process. Osseous structures unremarkable IMPRESSION: Tiny nonobstructing LEFT renal calculi. No evidence of LEFT ureteral calcification or urinary tract obstruction. Post RIGHT nephrectomy. Stable RIGHT adrenal adenoma. Electronically Signed   By: Lavonia Dana M.D.   On: 05/21/2015 16:37   I have personally reviewed and evaluated these images and lab results as part of my medical decision-making.   EKG Interpretation None      MDM   Final diagnoses:  Left flank pain  Pyelonephritis   35 year old female with a history of staghorn kidney stones status post right nephrectomy in 1991, and as a partial, incomplete in 1999, diabetes, hypothyroidism presents with concern for left flank pain. CT stone study was ordered and showed no sign of nephrolithiasis, no sign of abnormality, no sign of significant bladder distention. Creatinine is within normal limits Urinalysis shows contamination, however concern for possible infection.  Patient given prescription for Keflex for 2 weeks of possible pyelonephritis, and Zofran for nausea. Doubt pelvic etiology of pain at this time. Patient discharged in stable condition with understanding of reasons to return.    Gareth Morgan, MD 05/22/15 [phone]

## 2015-05-21 NOTE — ED Notes (Signed)
Bed: WA04 Expected date:  Expected time:  Means of arrival:  Comments: triage 

## 2015-05-21 NOTE — ED Notes (Signed)
Pt states that she has one kidney.  Had the rt kidney removed d/t kidney stones.  States that she began hurting on lt side about 6pm last night.  States that she passed a fragment last night but is not able to get a full stream of urine.  Has been vomiting.

## 2015-05-23 LAB — URINE CULTURE

## 2015-05-31 ENCOUNTER — Ambulatory Visit (HOSPITAL_COMMUNITY)
Admission: RE | Admit: 2015-05-31 | Discharge: 2015-05-31 | Disposition: A | Discharge status: Home / Self Care | Payer: BLUE CROSS/BLUE SHIELD | Source: Ambulatory Visit | Attending: General Surgery | Admitting: General Surgery

## 2015-05-31 ENCOUNTER — Other Ambulatory Visit: Payer: Self-pay

## 2015-05-31 DIAGNOSIS — Z01818 Encounter for other preprocedural examination: Secondary | ICD-10-CM | POA: Diagnosis present

## 2015-05-31 DIAGNOSIS — N200 Calculus of kidney: Secondary | ICD-10-CM | POA: Diagnosis not present

## 2015-06-06 NOTE — Progress Notes (Signed)
Cardiology Office Note   Date:  06/06/2015   ID:  Alison Fitzgerald, DOB [birth year], MRN [id number]  PCP:  Alison Kroner, MD  Cardiologist:   Alison Headings, MD   No chief complaint on file.  Problem list 1. Palpitations 2. Diabetes mellitus 3. PCL S 4. Hypothyroidism 5. Congential coronary artery disease:   Essentially Single coronary artery with a small posterior LCx by patients report.    History of Present Illness: Alison Fitzgerald is a 35 y.o. female who presents for palpitation .  Records from [facility] - Dr. Moreen Fitzgerald , are reviewed.   Has a hx of congenital Coronary artery disease.   Had a cath in 2009. Was essentially found to have a single coronary artery .    Has been having some chest pain / dyspnea  Severe CP recently ,  Pain comes and goes.   Has palpitations that wake her up at night . Associated with some dyspnea  Has had some tachypalps - a co-worker measured her HR at 201 several weeks ago  That episode was associated with lightheadedness and chest pressure.   Episodes can last as long as 1 1/2 hours.  Typically last 45 min - 1 hour.    Recent glucose levels are poorly controlled.   - HbA1C > 10. Does not exercise Works 2 jobs - works for [employer] and also works at [employer] .   June 06, 2015 Alison Fitzgerald is seen today for a follow up of her palpitations and CAD .   She has congenital coronary artery anomalies ( reportedly has a single coronary artery with a very small posterior lateral artery that has it's own origen - found at CT angio of the heart 0)   She has diabetes that has historically been poorly controlled.  We started her on metoprolol at her last visit The metoprolol seems to be helping  Has been limiting her caffiene intake   Past Medical History  Diagnosis Date  . Diabetes mellitus without complication ([place])   . Hypothyroidism   . Kidney stones   . Staghorn kidney stones     right nephrectomy     Past Surgical History    Procedure Laterality Date  . Nephrectomy       Current Outpatient Prescriptions  Medication Sig Dispense Refill  . allopurinol (ZYLOPRIM) 100 MG tablet Take 100 mg by mouth daily.  0  . BAYER MICROLET LANCETS lancets Use to check blood sugar 1 time per day. 100 each 2  . Blood Glucose Monitoring Suppl (BAYER CONTOUR NEXT MONITOR) w/Device KIT Use to check blood sugar 1 time per day. 1 kit 2  . captopril (CAPOTEN) 25 MG tablet Take 25 mg by mouth 2 (two) times daily.  11  . colchicine 0.6 MG tablet Take 1 tablet (0.6 mg total) by mouth 2 (two) times daily. 30 tablet 0  . cyclobenzaprine (FLEXERIL) 5 MG tablet Take 5 mg by mouth 2 (two) times daily as needed for muscle spasms.    . diazepam (VALIUM) 2 MG tablet Take 1 tablet (2 mg total) by mouth every 8 (eight) hours as needed for muscle spasms. 15 tablet 0  . glucose blood (BAYER CONTOUR NEXT TEST) test strip Use to check blood sugar 1 time per day. 100 each 2  . HYDROcodone-acetaminophen (NORCO/VICODIN) 5-325 MG tablet Take 1-2 tablets by mouth every 4 (four) hours as needed.  0  . ibuprofen (ADVIL,MOTRIN) 200 MG tablet Take 400 mg by mouth every 6 (six)  hours as needed for mild pain.    . Insulin Glargine (LANTUS SOLOSTAR) 100 UNIT/ML Solostar Pen Inject 20 Units into the skin daily at 10 pm. 15 mL 1  . Insulin Pen Needle 32G X 4 MM MISC Use to inject insulin 4 times daily as instructed. 130 each 5  . insulin regular (NOVOLIN R,HUMULIN R) 100 units/mL injection Inject 0.1 mLs (10 Units total) into the skin 3 (three) times daily before meals. (Patient taking differently: Inject 10 Units into the skin 3 (three) times daily before meals. Additional 5 units if bs >300) 30 mL 3  . Insulin Syringe-Needle U-100 31G X 5/16" 0.3 ML MISC 1 Device by Does not apply route 3 (three) times daily. 100 each 3  . levothyroxine (SYNTHROID, LEVOTHROID) 112 MCG tablet 2 tabs daily 100 tablet 5  . metoprolol tartrate (LOPRESSOR) 25 MG tablet Take 1 tablet (25  mg total) by mouth 2 (two) times daily. 180 tablet 3  . ondansetron (ZOFRAN ODT) 4 MG disintegrating tablet Take 1 tablet (4 mg total) by mouth every 8 (eight) hours as needed for nausea or vomiting. 20 tablet 0  . oxyCODONE-acetaminophen (ROXICET) 5-325 MG tablet Take 1 tablet by mouth every 4 (four) hours as needed for severe pain. 15 tablet 0  . potassium citrate (UROCIT-K) 10 MEQ (1080 MG) SR tablet Take 10 mEq by mouth 4 (four) times daily.    . propranolol (INDERAL) 10 MG tablet Take 1 tablet (10 mg total) by mouth 4 (four) times daily as needed. 60 tablet 11  . traMADol (ULTRAM) 50 MG tablet Take 1 tablet (50 mg total) by mouth every 6 (six) hours as needed. 12 tablet 0   No current facility-administered medications for this visit.    Allergies:   Doxycycline; Toradol; Food; Indomethacin; Other; and Prednisone    Social History:  The patient  reports that she has never smoked. She has never used smokeless tobacco. She reports that she does not drink alcohol or use illicit drugs.   Family History:  The patient's family history includes Diabetes in her maternal grandmother and mother; Ovarian cancer in her paternal grandmother.    ROS:  Please see the history of present illness.    Review of Systems: Constitutional:  denies fever, chills, diaphoresis, appetite change and fatigue.  HEENT: denies photophobia, eye pain, redness, hearing loss, ear pain, congestion, sore throat, rhinorrhea, sneezing, neck pain, neck stiffness and tinnitus.  Respiratory: denies SOB, DOE, cough, chest tightness, and wheezing.  Cardiovascular: admits to  palpitations    Gastrointestinal: denies nausea, vomiting, abdominal pain, diarrhea, constipation, blood in stool.  Genitourinary: denies dysuria, urgency, frequency, hematuria, flank pain and difficulty urinating.  Musculoskeletal: denies  myalgias, back pain, joint swelling, arthralgias and gait problem.   Skin: denies pallor, rash and wound.   Neurological: denies dizziness, seizures, syncope, weakness, light-headedness, numbness and headaches.   Hematological: denies adenopathy, easy bruising, personal or family bleeding history.  Psychiatric/ Behavioral: denies suicidal ideation, mood changes, confusion, nervousness, sleep disturbance and agitation.       All other systems are reviewed and negative.    PHYSICAL EXAM: VS:  LMP 05/15/2015 , BMI There is no weight on file to calculate BMI. GEN: Well nourished, well developed, in no acute distress HEENT: normal Neck: no JVD, carotid bruits, or masses Cardiac: RRR; no murmurs, rubs, or gallops,no edema  Respiratory:  clear to auscultation bilaterally, normal work of breathing GI: soft, nontender, nondistended, + BS MS: no deformity or atrophy Skin:  warm and dry, no rash Neuro:  Strength and sensation are intact Psych: normal   EKG:  EKG is not ordered today. The ekg ordered 01/28/15  demonstrates sinus tach .  No ST or T wave changes.    Recent Labs: 10/28/2014: ALT 22 05/21/2015: BUN 14; Creatinine, Ser 0.79; Hemoglobin 14.4; Platelets 305; Potassium 4.7; Sodium 139    Lipid Panel No results found for: CHOL, TRIG, HDL, CHOLHDL, VLDL, LDLCALC, LDLDIRECT    Wt Readings from Last 3 Encounters:  03/27/15 259 lb (117.482 kg)  03/22/15 257 lb (116.574 kg)  02/23/15 260 lb (117.935 kg)      Other studies Reviewed: Additional studies/ records that were reviewed today include: . Review of the above records demonstrates:    ASSESSMENT AND PLAN:  1.  Palpitations: She presents with fol follow up  of palpitations.   The 30 day monitor did not reveal any significant arrhythmias.  Continue current dose of metoprolol   2. Diabetes mellitus: Her diabetes has been poorly controlled. She's been working on that.  Hb A1c is better   3. Hypothyroidism: She is on Synthroid. Followed by Dr. Loanne Drilling.  Last TSH is normal.  4. CAD:   Reportedly has congenital CAD with a single  coronary artery  + a small posterior lateral artery .  Continue medical management   Current medicines are reviewed at length with the patient today.  The patient does not have concerns regarding medicines.  The following changes have been made:  no change  Labs/ tests ordered today include:  No orders of the defined types were placed in this encounter.     Disposition:   FU with me in 6  months .     Fleeta Kunde, Wonda Cheng, MD  06/06/2015 1:09 PM    [facility] [place], [place], [place]  [address] Phone: [phone]; Fax: [phone]   [facility]  [address] [place], [place]  [address] [phone]   Fax [phone]

## 2015-06-07 ENCOUNTER — Encounter: Payer: Self-pay | Admitting: Cardiovascular Disease

## 2015-06-07 ENCOUNTER — Ambulatory Visit (INDEPENDENT_AMBULATORY_CARE_PROVIDER_SITE_OTHER): Payer: BLUE CROSS/BLUE SHIELD | Admitting: Cardiovascular Disease

## 2015-06-07 VITALS — BP 128/98 | HR 86 | Ht 64.0 in | Wt 265.1 lb

## 2015-06-07 DIAGNOSIS — I471 Supraventricular tachycardia: Secondary | ICD-10-CM

## 2015-06-07 NOTE — Patient Instructions (Signed)
Medication Instructions:  Your physician recommends that you continue on your current medications as directed. Please refer to the Current Medication list given to you today.  Labwork: None  Testing/Procedures: None  Follow-Up: Your physician wants you to follow-up in: 6 months with Dr. Nahser.  You will receive a reminder letter in the mail two months in advance. If you don't receive a letter, please call our office to schedule the follow-up appointment.   Any Other Special Instructions Will Be Listed Below (If Applicable).     If you need a refill on your cardiac medications before your next appointment, please call your pharmacy.   

## 2015-06-26 ENCOUNTER — Encounter: Payer: Self-pay | Admitting: Dietician

## 2015-06-26 ENCOUNTER — Encounter: Payer: BLUE CROSS/BLUE SHIELD | Attending: General Surgery | Admitting: Dietician

## 2015-06-26 DIAGNOSIS — Z6841 Body Mass Index (BMI) 40.0 and over, adult: Secondary | ICD-10-CM | POA: Insufficient documentation

## 2015-06-26 NOTE — Progress Notes (Signed)
  Pre-Op Assessment Visit:  Pre-Operative Sleeve gastrectomy Surgery  Medical Nutrition Therapy:  Appt start time: 330   End time:  [id number]  Patient was seen on 06/26/2015 for Pre-Operative Nutrition Assessment. Assessment and letter of approval faxed to [facility] Program coordinator on 06/26/2015.   Preferred Learning Style:   No preference indicated   Learning Readiness:   Ready  Handouts given during visit include:  Pre-Op Goals Bariatric Surgery Protein Shakes   During the appointment today the following Pre-Op Goals were reviewed with the patient: Maintain or lose weight as instructed by your surgeon Make healthy food choices Begin to limit portion sizes Limited concentrated sugars and fried foods Keep fat/sugar in the single digits per serving on   food labels Practice CHEWING your food  (aim for 30 chews per bite or until applesauce consistency) Practice not drinking 15 minutes before, during, and 30 minutes after each meal/snack Avoid all carbonated beverages  Avoid/limit caffeinated beverages  Avoid all sugar-sweetened beverages Consume 3 meals per day; eat every 3-5 hours Make a list of non-food related activities Aim for 64-100 ounces of FLUID daily  Aim for at least 60-80 grams of PROTEIN daily Look for a liquid protein source that contain ?15 g protein and ?5 g carbohydrate  (ex: shakes, drinks, shots)  Demonstrated degree of understanding via:  Teach Back  Teaching Method Utilized:  Visual Auditory Hands on  Barriers to learning/adherence to lifestyle change: none  Patient to call the [facility] to enroll in Pre-Op and Post-Op Nutrition Education when surgery date is scheduled.

## 2015-06-26 NOTE — Patient Instructions (Signed)
Follow Pre-Op Goals Try [employer] Call [facility] at [phone] when surgery is scheduled to enroll in Pre-Op Class  Things to remember:  Please always be honest with us. We want to support you!  If you have any questions or concerns in between appointments, please call or email Liz, Neeya Prigmore, or Laurie.  The diet after surgery will be high protein and low in carbohydrate.  Vitamins and calcium need to be taken for the rest of your life.  Feel free to include support people in any classes or appointments.   Supplement recommendations:  Complete" Multivitamin: Sleeve Gastrectomy and RYGB patients take a double dose of MVI. LAGB patients take single dose as it is written on the package. Vitamin must be liquid or chewable but not gummy. Examples of these include Flintstones Complete and Centrum Complete. If the vitamin is bariatric-specific, take 1 dose as it is already formulated for bariatric surgery patients. Examples of these are Bariatric Advantage, Celebrate, and Wellesse. These can be found at the [facility] and/or online.     Calcium citrate: 1500 mg/day of Calcium citrate (also chewable or liquid) is recommended for all procedures. The body is only able to absorb 500-600 mg of Calcium at one time so 3 daily doses of 500 mg are recommended. Calcium doses must be taken a minimum of 2 hours apart. Additionally, Calcium must be taken 2 hours apart from iron-containing MVI. Examples of brands include Celebrate, Bariatric Advantage, and Wellesse. These brands must be purchased online or at the [facility]. Citracal Petites is the only Calcium citrate supplement found in general grocery stores and pharmacies. This is in tablet form and may be recommended for patients who do not tolerate chewable Calcium.  Continued or added Vitamin D supplementation based on individual needs.    Vitamin B12: 300-500 mcg/day for Sleeve Gastrectomy and RYGB. Optional for  LAGB patients as stomach remains fully intact. Must be taken intramuscularly, sublingually, or inhaled nasally. Oral route is not recommended. 

## 2015-07-25 ENCOUNTER — Ambulatory Visit (HOSPITAL_COMMUNITY)
Admission: RE | Admit: 2015-07-25 | Discharge: 2015-07-25 | Disposition: A | Discharge status: Home / Self Care | Payer: BLUE CROSS/BLUE SHIELD | Source: Ambulatory Visit | Attending: Urology | Admitting: Urology

## 2015-07-25 DIAGNOSIS — D3501 Benign neoplasm of right adrenal gland: Secondary | ICD-10-CM | POA: Insufficient documentation

## 2015-07-25 DIAGNOSIS — E7201 Cystinuria: Secondary | ICD-10-CM | POA: Insufficient documentation

## 2015-07-25 DIAGNOSIS — Z905 Acquired absence of kidney: Secondary | ICD-10-CM | POA: Insufficient documentation

## 2015-07-29 ENCOUNTER — Ambulatory Visit (HOSPITAL_COMMUNITY): Payer: BLUE CROSS/BLUE SHIELD

## 2015-08-07 ENCOUNTER — Ambulatory Visit: Payer: Self-pay | Admitting: Urology

## 2015-08-24 ENCOUNTER — Emergency Department (HOSPITAL_COMMUNITY)
Admission: EM | Admit: 2015-08-24 | Discharge: 2015-08-25 | Disposition: A | Discharge status: Home / Self Care | Payer: BLUE CROSS/BLUE SHIELD | Attending: Emergency Medicine | Admitting: Emergency Medicine

## 2015-08-24 ENCOUNTER — Encounter (HOSPITAL_COMMUNITY): Payer: Self-pay | Admitting: *Deleted

## 2015-08-24 DIAGNOSIS — E039 Hypothyroidism, unspecified: Secondary | ICD-10-CM | POA: Insufficient documentation

## 2015-08-24 DIAGNOSIS — Z79899 Other long term (current) drug therapy: Secondary | ICD-10-CM | POA: Diagnosis not present

## 2015-08-24 DIAGNOSIS — N764 Abscess of vulva: Secondary | ICD-10-CM | POA: Diagnosis present

## 2015-08-24 DIAGNOSIS — E119 Type 2 diabetes mellitus without complications: Secondary | ICD-10-CM | POA: Insufficient documentation

## 2015-08-24 DIAGNOSIS — Z794 Long term (current) use of insulin: Secondary | ICD-10-CM | POA: Insufficient documentation

## 2015-08-24 DIAGNOSIS — R21 Rash and other nonspecific skin eruption: Secondary | ICD-10-CM | POA: Diagnosis not present

## 2015-08-24 NOTE — ED Notes (Signed)
Pt c/o abscess to left vaginal area, pt also c/o rash to abd area that started a few days ago .

## 2015-08-25 MED ORDER — NYSTATIN-TRIAMCINOLONE 100000-0.1 UNIT/GM-% EX CREA: TOPICAL_CREAM | CUTANEOUS | Status: DC

## 2015-08-25 MED ORDER — SULFAMETHOXAZOLE-TRIMETHOPRIM 800-160 MG PO TABS: 1.0000 | ORAL_TABLET | Freq: Two times a day (BID) | ORAL | Status: AC

## 2015-08-25 MED ORDER — HYDROCODONE-ACETAMINOPHEN 5-325 MG PO TABS: 1.0000 | ORAL_TABLET | ORAL | Status: DC | PRN

## 2015-08-25 MED ORDER — HYDROCODONE-ACETAMINOPHEN 5-325 MG PO TABS: 2.0000 | ORAL_TABLET | ORAL | Status: DC | PRN

## 2015-08-25 MED ORDER — LIDOCAINE HCL (PF) 1 % IJ SOLN
INTRAMUSCULAR | Status: AC
  Filled 2015-08-25: qty 5

## 2015-08-25 NOTE — Discharge Instructions (Signed)
Abscess An abscess is an infected area that contains a collection of pus and debris.It can occur in almost any part of the body. An abscess is also known as a furuncle or boil. CAUSES  An abscess occurs when tissue gets infected. This can occur from blockage of oil or sweat glands, infection of hair follicles, or a minor injury to the skin. As the body tries to fight the infection, pus collects in the area and creates pressure under the skin. This pressure causes pain. People with weakened immune systems have difficulty fighting infections and get certain abscesses more often.  SYMPTOMS Usually an abscess develops on the skin and becomes a painful mass that is red, warm, and tender. If the abscess forms under the skin, you may feel a moveable soft area under the skin. Some abscesses break open (rupture) on their own, but most will continue to get worse without care. The infection can spread deeper into the body and eventually into the bloodstream, causing you to feel ill.  DIAGNOSIS  Your caregiver will take your medical history and perform a physical exam. A sample of fluid may also be taken from the abscess to determine what is causing your infection. TREATMENT  Your caregiver may prescribe antibiotic medicines to fight the infection. However, taking antibiotics alone usually does not cure an abscess. Your caregiver may need to make a small cut (incision) in the abscess to drain the pus. In some cases, gauze is packed into the abscess to reduce pain and to continue draining the area. HOME CARE INSTRUCTIONS   Only take over-the-counter or prescription medicines for pain, discomfort, or fever as directed by your caregiver.  If you were prescribed antibiotics, take them as directed. Finish them even if you start to feel better.  If gauze is used, follow your caregiver's directions for changing the gauze.  To avoid spreading the infection:  Keep your draining abscess covered with a  bandage.  Wash your hands well.  Do not share personal care items, towels, or whirlpools with others.  Avoid skin contact with others.  Keep your skin and clothes clean around the abscess.  Keep all follow-up appointments as directed by your caregiver. SEEK MEDICAL CARE IF:   You have increased pain, swelling, redness, fluid drainage, or bleeding.  You have muscle aches, chills, or a general ill feeling.  You have a fever. MAKE SURE YOU:   Understand these instructions.  Will watch your condition.  Will get help right away if you are not doing well or get worse.   Cutaneous Candidiasis Cutaneous candidiasis is a condition in which there is an overgrowth of yeast (candida) on the skin. Yeast normally live on the skin, but in small enough numbers not to cause any symptoms. In certain cases, increased growth of the yeast may cause an actual yeast infection. This kind of infection usually occurs in areas of the skin that are constantly warm and moist, such as the armpits or the groin. Yeast is the most common cause of diaper rash in babies and in people who cannot control their bowel movements (incontinence). CAUSES  The fungus that most often causes cutaneous candidiasis is Candida albicans. Conditions that can increase the risk of getting a yeast infection of the skin include:  Obesity.  Pregnancy.  Diabetes.  Taking antibiotic medicine.  Taking birth control pills.  Taking steroid medicines.  Thyroid disease.  An iron or zinc deficiency.  Problems with the immune system. SYMPTOMS   Red, swollen area  of the skin.  Bumps on the skin.  Itchiness. DIAGNOSIS  The diagnosis of cutaneous candidiasis is usually based on its appearance. Light scrapings of the skin may also be taken and viewed under a microscope to identify the presence of yeast. TREATMENT  Antifungal creams may be applied to the infected skin. In severe cases, oral medicines may be needed.  HOME CARE  INSTRUCTIONS   Keep your skin clean and dry.  Maintain a healthy weight.  If you have diabetes, keep your blood sugar under control. SEEK IMMEDIATE MEDICAL CARE IF:  Your rash continues to spread despite treatment.  You have a fever, chills, or abdominal pain.   This information is not intended to replace advice given to you by your health care provider. Make sure you discuss any questions you have with your health care provider.   Document Released: 12/09/2010 Document Revised: 06/15/2011 Document Reviewed: 09/24/2014 Elsevier Interactive Patient Education [employer].  This information is not intended to replace advice given to you by your health care provider. Make sure you discuss any questions you have with your health care provider.   Document Released: 12/31/2004 Document Revised: 09/22/2011 Document Reviewed: 06/05/2011 Elsevier Interactive Patient Education [employer].

## 2015-08-26 NOTE — ED Provider Notes (Signed)
CSN: [id number]     Arrival date & time 08/24/15  2340 History   First MD Initiated Contact with Patient 08/24/15 2350     Chief Complaint  Patient presents with  . Abscess     (Consider location/radiation/quality/duration/timing/severity/associated sxs/prior Treatment) The history is provided by the patient.   Alison Fitzgerald is a 35 y.o. female  Presenting with an abscess at her left labia which started as a small pimple earlier this week but has become larger, more painful and has not drained despite warm soaks.  She denies fevers or chills, nausea, vomiting or other constitutional sx.  She is a diabetic with her blood lucose levels under fair control, her last check was in the upper 100's.    Additionally she reports a rash also starting this week on her anterior abdomen along her waistline.  It is prurutic, dry and she denies pain, drainage or any other complaint.  She does not recognize any new exposures to chemicals, foods, medicines. No new clothing or belts.  She has used hydrocortisone cream without relief.    Past Medical History  Diagnosis Date  . Diabetes mellitus without complication ([place])   . Hypothyroidism   . Kidney stones   . Staghorn kidney stones     right nephrectomy    Past Surgical History  Procedure Laterality Date  . Nephrectomy    . Cesarean section     Family History  Problem Relation Age of Onset  . Ovarian cancer Paternal Grandmother   . Diabetes Maternal Grandmother   . Diabetes Mother    Social History  Substance Use Topics  . Smoking status: Never Smoker   . Smokeless tobacco: Never Used  . Alcohol Use: No   OB History    No data available     Review of Systems  Constitutional: Negative for fever and chills.  Respiratory: Negative for shortness of breath and wheezing.   Gastrointestinal: Negative for nausea.  Skin: Positive for color change and rash.       Negative except as mentioned in HPI.    Neurological: Negative for  numbness.      Allergies  Doxycycline; Toradol; Food; Indomethacin; Other; and Prednisone  Home Medications   Prior to Admission medications   Medication Sig Start Date End Date Taking? Authorizing Provider  allopurinol (ZYLOPRIM) 100 MG tablet Take 100 mg by mouth daily. 04/15/15  Yes Historical Provider, MD  BAYER MICROLET LANCETS lancets Use to check blood sugar 1 time per day. 04/12/15  Yes Renato Shin, MD  Blood Glucose Monitoring Suppl (BAYER CONTOUR NEXT MONITOR) w/Device KIT Use to check blood sugar 1 time per day. 04/12/15  Yes Renato Shin, MD  captopril (CAPOTEN) 25 MG tablet Take 25 mg by mouth 2 (two) times daily. 04/05/15  Yes Historical Provider, MD  cyclobenzaprine (FLEXERIL) 5 MG tablet Take 5 mg by mouth 2 (two) times daily as needed for muscle spasms.   Yes Historical Provider, MD  glucose blood (BAYER CONTOUR NEXT TEST) test strip Use to check blood sugar 1 time per day. 04/12/15  Yes Renato Shin, MD  ibuprofen (ADVIL,MOTRIN) 200 MG tablet Take 400 mg by mouth every 6 (six) hours as needed for mild pain.   Yes Historical Provider, MD  Insulin Glargine (LANTUS SOLOSTAR) 100 UNIT/ML Solostar Pen Inject 20 Units into the skin daily at 10 pm. 04/05/15  Yes Renato Shin, MD  Insulin Pen Needle 32G X 4 MM MISC Use to inject insulin 4 times daily as instructed.  04/05/15  Yes Renato Shin, MD  insulin regular (NOVOLIN R,HUMULIN R) 100 units/mL injection Inject 0.1 mLs (10 Units total) into the skin 3 (three) times daily before meals. Patient taking differently: Inject 10 Units into the skin 3 (three) times daily before meals. Additional 5 units if bs >300 01/29/15  Yes Renato Shin, MD  Insulin Syringe-Needle U-100 31G X 5/16" 0.3 ML MISC 1 Device by Does not apply route 3 (three) times daily. 01/29/15  Yes Renato Shin, MD  levothyroxine (SYNTHROID, LEVOTHROID) 112 MCG tablet 2 tabs daily 01/29/15  Yes Renato Shin, MD  potassium citrate (UROCIT-K) 10 MEQ (1080 MG) SR tablet Take 10  mEq by mouth 4 (four) times daily.   Yes Historical Provider, MD  colchicine 0.6 MG tablet Take 1 tablet (0.6 mg total) by mouth 2 (two) times daily. 03/22/15   Rolland Porter, MD  HYDROcodone-acetaminophen (NORCO/VICODIN) 5-325 MG tablet Take 1 tablet by mouth every 4 (four) hours as needed. 08/25/15   Evalee Jefferson, PA-C  HYDROcodone-acetaminophen (NORCO/VICODIN) 5-325 MG tablet Take 2 tablets by mouth every 4 (four) hours as needed. 08/25/15   Evalee Jefferson, PA-C  metoprolol tartrate (LOPRESSOR) 25 MG tablet Take 1 tablet (25 mg total) by mouth 2 (two) times daily. 02/18/15   Thayer Headings, MD  nystatin-triamcinolone (MYCOLOG II) cream Apply to rash on abdomen daily for 10 days. 08/25/15   Evalee Jefferson, PA-C  oxyCODONE-acetaminophen (ROXICET) 5-325 MG tablet Take 1 tablet by mouth every 4 (four) hours as needed for severe pain. 02/23/15   Paulette Blanch, MD  propranolol (INDERAL) 10 MG tablet Take 1 tablet (10 mg total) by mouth 4 (four) times daily as needed. 02/18/15   Thayer Headings, MD  sulfamethoxazole-trimethoprim (BACTRIM DS,SEPTRA DS) 800-160 MG tablet Take 1 tablet by mouth 2 (two) times daily. 08/25/15 09/01/15  Evalee Jefferson, PA-C  traMADol (ULTRAM) 50 MG tablet Take 1 tablet (50 mg total) by mouth every 6 (six) hours as needed. 05/21/15   Gareth Morgan, MD   BP 152/96 mmHg  Pulse 107  Temp(Src) 98.2 F (36.8 C) (Oral)  Resp 18  Ht _0  (1.626 m)  Wt 118.842 kg  BMI 44.95 kg/m2  SpO2 99%  LMP 08/02/2015 Physical Exam  Constitutional: She is oriented to person, place, and time. She appears well-developed and well-nourished.  HENT:  Head: Normocephalic.  Cardiovascular: Normal rate.   Pulmonary/Chest: Effort normal.  Neurological: She is alert and oriented to person, place, and time. No sensory deficit.  Skin:  Indurated abscess left mid labia with small area of central fluctuance. No drainage.  No red streaking.   Dry, scaling macular rash left abdomen at waistline.    ED Course   Procedures (including critical care time)  INCISION AND DRAINAGE Performed by: Evalee Jefferson Consent: Verbal consent obtained. Risks and benefits: risks, benefits and alternatives were discussed Type: abscess  Body area: left labia  Anesthesia: local infiltration  Incision was made with a scalpel.  Local anesthetic: lidocaine 1% without epinephrine  Anesthetic total: 3 ml  Complexity: complex Blunt dissection to break up loculations  Drainage: purulent  Drainage amount: small  Packing material: none Patient tolerance: Patient tolerated the procedure well with no immediate complications.    Labs Review Labs Reviewed - No data to display  Imaging Review No results found. I have personally reviewed and evaluated these images and lab results as part of my medical decision-making.   EKG Interpretation None      MDM  Final diagnoses:  Left genital labial abscess  Rash    Advised continued warm soaks. Bactrim and hydrocodone prescribed.  Also suspect there may be a fungal component to her abdominal rash. Prescribed mycolog II cream. Advised f/u with pcp for a recheck in 2 days if abscess not improving or for any worsened sx.    Evalee Jefferson, PA-C 08/26/15 [place], DO 08/27/15 2080

## 2015-08-27 MED FILL — Hydrocodone-Acetaminophen Tab 5-325 MG: ORAL | Qty: 6 | Status: AC

## 2015-09-04 ENCOUNTER — Ambulatory Visit (INDEPENDENT_AMBULATORY_CARE_PROVIDER_SITE_OTHER): Payer: BLUE CROSS/BLUE SHIELD | Admitting: Urology

## 2015-09-04 DIAGNOSIS — N200 Calculus of kidney: Secondary | ICD-10-CM | POA: Diagnosis not present

## 2015-09-04 DIAGNOSIS — E7201 Cystinuria: Secondary | ICD-10-CM

## 2015-09-06 ENCOUNTER — Other Ambulatory Visit: Payer: Self-pay | Admitting: Urology

## 2015-09-06 DIAGNOSIS — N200 Calculus of kidney: Secondary | ICD-10-CM

## 2015-09-29 ENCOUNTER — Emergency Department (HOSPITAL_COMMUNITY): Payer: BC Managed Care – PPO

## 2015-09-29 ENCOUNTER — Emergency Department (HOSPITAL_COMMUNITY)
Admission: EM | Admit: 2015-09-29 | Discharge: 2015-09-29 | Disposition: A | Discharge status: Home / Self Care | Payer: BC Managed Care – PPO | Attending: Emergency Medicine | Admitting: Emergency Medicine

## 2015-09-29 ENCOUNTER — Encounter (HOSPITAL_COMMUNITY): Payer: Self-pay | Admitting: *Deleted

## 2015-09-29 DIAGNOSIS — E039 Hypothyroidism, unspecified: Secondary | ICD-10-CM | POA: Diagnosis not present

## 2015-09-29 DIAGNOSIS — R109 Unspecified abdominal pain: Secondary | ICD-10-CM | POA: Diagnosis present

## 2015-09-29 DIAGNOSIS — Z794 Long term (current) use of insulin: Secondary | ICD-10-CM | POA: Insufficient documentation

## 2015-09-29 DIAGNOSIS — E119 Type 2 diabetes mellitus without complications: Secondary | ICD-10-CM | POA: Insufficient documentation

## 2015-09-29 LAB — BASIC METABOLIC PANEL
Anion gap: 9 (ref 5–15)
BUN: 18 mg/dL (ref 6–20)
CALCIUM: 9.1 mg/dL (ref 8.9–10.3)
CO2: 25 mmol/L (ref 22–32)
Chloride: 100 mmol/L — ABNORMAL LOW (ref 101–111)
Creatinine, Ser: 0.97 mg/dL (ref 0.44–1.00)
GFR calc Af Amer: >60 mL/min (ref >60)
GLUCOSE: 270 mg/dL — AB (ref 65–99)
Potassium: 4.1 mmol/L (ref 3.5–5.1)
Sodium: 134 mmol/L — ABNORMAL LOW (ref 135–145)

## 2015-09-29 LAB — CBC WITH DIFFERENTIAL/PLATELET
Basophils Absolute: 0 10*3/uL (ref 0.0–0.1)
Basophils Relative: 0 %
EOS PCT: 4 %
Eosinophils Absolute: 0.3 10*3/uL (ref 0.0–0.7)
HCT: 41.5 % (ref 36.0–46.0)
Hemoglobin: 14.2 g/dL (ref 12.0–15.0)
LYMPHS ABS: 2.2 10*3/uL (ref 0.7–4.0)
LYMPHS PCT: 34 %
MCH: 31.3 pg (ref 26.0–34.0)
MCHC: 34.2 g/dL (ref 30.0–36.0)
MCV: 91.4 fL (ref 78.0–100.0)
MONO ABS: 0.4 10*3/uL (ref 0.1–1.0)
Monocytes Relative: 6 %
Neutro Abs: 3.8 10*3/uL (ref 1.7–7.7)
Neutrophils Relative %: 56 %
PLATELETS: 328 10*3/uL (ref 150–400)
RBC: 4.54 MIL/uL (ref 3.87–5.11)
RDW: 13.5 % (ref 11.5–15.5)
WBC: 6.7 10*3/uL (ref 4.0–10.5)

## 2015-09-29 LAB — URINE MICROSCOPIC-ADD ON: BACTERIA UA: NONE SEEN

## 2015-09-29 LAB — URINALYSIS, ROUTINE W REFLEX MICROSCOPIC
BILIRUBIN URINE: NEGATIVE
Glucose, UA: >1000 mg/dL — AB
Ketones, ur: NEGATIVE mg/dL
Leukocytes, UA: NEGATIVE
Nitrite: NEGATIVE
PH: 6.5 (ref 5.0–8.0)
Protein, ur: >300 mg/dL — AB
SPECIFIC GRAVITY, URINE: 1.028 (ref 1.005–1.030)

## 2015-09-29 LAB — POC URINE PREG, ED: PREG TEST UR: NEGATIVE

## 2015-09-29 MED ORDER — ONDANSETRON HCL 4 MG/2ML IJ SOLN
4.0000 mg | Freq: Once | INTRAMUSCULAR | Status: AC | PRN
Start: 2015-09-29 — End: 2015-09-29
  Administered 2015-09-29: 4 mg via INTRAVENOUS
  Filled 2015-09-29: qty 2

## 2015-09-29 MED ORDER — FENTANYL CITRATE (PF) 100 MCG/2ML IJ SOLN
25.0000 ug | INTRAMUSCULAR | Status: DC | PRN
  Administered 2015-09-29: 25 ug via INTRAVENOUS
  Filled 2015-09-29: qty 2

## 2015-09-29 NOTE — Discharge Instructions (Signed)
Flank Pain °Flank pain refers to pain that is located on the side of the body between the upper abdomen and the back. The pain may occur over a short period of time (acute) or may be long-term or reoccurring (chronic). It may be mild or severe. Flank pain can be caused by many things. °CAUSES  °Some of the more common causes of flank pain include: °· Muscle strains.   °· Muscle spasms.   °· A disease of your spine (vertebral disk disease).   °· A lung infection (pneumonia).   °· Fluid around your lungs (pulmonary edema).   °· A kidney infection.   °· Kidney stones.   °· A very painful skin rash caused by the chickenpox virus (shingles).   °· Gallbladder disease.   °HOME CARE INSTRUCTIONS  °Home care will depend on the cause of your pain. In general, °· Rest as directed by your caregiver. °· Drink enough fluids to keep your urine clear or pale yellow. °· Only take over-the-counter or prescription medicines as directed by your caregiver. Some medicines may help relieve the pain. °· Tell your caregiver about any changes in your pain. °· Follow up with your caregiver as directed. °SEEK IMMEDIATE MEDICAL CARE IF:  °· Your pain is not controlled with medicine.   °· You have new or worsening symptoms. °· Your pain increases.   °· You have abdominal pain.   °· You have shortness of breath.   °· You have persistent nausea or vomiting.   °· You have swelling in your abdomen.   °· You feel faint or pass out.   °· You have blood in your urine. °· You have a fever or persistent symptoms for more than 2-3 days. °· You have a fever and your symptoms suddenly get worse. °MAKE SURE YOU:  °· Understand these instructions. °· Will watch your condition. °· Will get help right away if you are not doing well or get worse. °  °This information is not intended to replace advice given to you by your health care provider. Make sure you discuss any questions you have with your health care provider. °  °Document Released: 05/14/2005 Document  Revised: 12/16/2011 Document Reviewed: 11/05/2011 °Elsevier Interactive Patient Education ©2016 [employer] Inc. ° °

## 2015-09-29 NOTE — ED Notes (Signed)
Pt complains of left sided flank pain radiating to left abdomen for the past 4 days. Pt has hx of kidney stones, states she has passed 3 fragments over the past couple of days. Pt had right kidney removed due to kidney stones. Pt states she has vomited twice today due to pain.

## 2015-09-29 NOTE — ED Provider Notes (Signed)
CSN: [id number]     Arrival date & time 09/29/15  1300 History   First MD Initiated Contact with Patient 09/29/15 1442     Chief Complaint  Patient presents with  . Flank Pain     (Consider location/radiation/quality/duration/timing/severity/associated sxs/prior Treatment) Patient is a 35 y.o. female presenting with flank pain. The history is provided by the patient.  Flank Pain This is a recurrent problem. The current episode started more than 2 days ago. The problem occurs constantly. The problem has not changed since onset.Nothing aggravates the symptoms. Nothing relieves the symptoms. She has tried nothing for the symptoms.    Past Medical History  Diagnosis Date  . Diabetes mellitus without complication ([place])   . Hypothyroidism   . Kidney stones   . Staghorn kidney stones     right nephrectomy    Past Surgical History  Procedure Laterality Date  . Nephrectomy    . Cesarean section     Family History  Problem Relation Age of Onset  . Ovarian cancer Paternal Grandmother   . Diabetes Maternal Grandmother   . Diabetes Mother    Social History  Substance Use Topics  . Smoking status: Never Smoker   . Smokeless tobacco: Never Used  . Alcohol Use: No   OB History    No data available     Review of Systems  Genitourinary: Positive for flank pain.  All other systems reviewed and are negative.     Allergies  Doxycycline; Toradol; Food; Indomethacin; Other; and Prednisone  Home Medications   Prior to Admission medications   Medication Sig Start Date End Date Taking? Authorizing Provider  allopurinol (ZYLOPRIM) 100 MG tablet Take 100 mg by mouth at bedtime.  04/15/15  Yes Historical Provider, MD  captopril (CAPOTEN) 25 MG tablet Take 25 mg by mouth 2 (two) times daily. 04/05/15  Yes Historical Provider, MD  HYDROcodone-acetaminophen (NORCO/VICODIN) 5-325 MG tablet Take 1 tablet by mouth every 4 (four) hours as needed. 08/25/15  Yes Evalee Jefferson, PA-C  Insulin Glargine  (LANTUS SOLOSTAR) 100 UNIT/ML Solostar Pen Inject 20 Units into the skin daily at 10 pm. 04/05/15  Yes Renato Shin, MD  insulin regular (NOVOLIN R,HUMULIN R) 100 units/mL injection Inject 0.1 mLs (10 Units total) into the skin 3 (three) times daily before meals. Patient taking differently: Inject 10 Units into the skin 3 (three) times daily before meals. Additional 2 units if bs>250, 5 units if bs >300 01/29/15  Yes Renato Shin, MD  levothyroxine (SYNTHROID, LEVOTHROID) 112 MCG tablet 2 tabs daily 01/29/15  Yes Renato Shin, MD  metoprolol tartrate (LOPRESSOR) 25 MG tablet Take 1 tablet (25 mg total) by mouth 2 (two) times daily. 02/18/15  Yes Thayer Headings, MD  potassium citrate (UROCIT-K) 10 MEQ (1080 MG) SR tablet Take 10 mEq by mouth 4 (four) times daily.   Yes Historical Provider, MD  BAYER MICROLET LANCETS lancets Use to check blood sugar 1 time per day. 04/12/15   Renato Shin, MD  Blood Glucose Monitoring Suppl (BAYER CONTOUR NEXT MONITOR) w/Device KIT Use to check blood sugar 1 time per day. 04/12/15   Renato Shin, MD  colchicine 0.6 MG tablet Take 1 tablet (0.6 mg total) by mouth 2 (two) times daily. Patient not taking: Reported on 09/29/2015 03/22/15   Rolland Porter, MD  glucose blood (BAYER CONTOUR NEXT TEST) test strip Use to check blood sugar 1 time per day. 04/12/15   Renato Shin, MD  Insulin Pen Needle 32G X 4 MM  MISC Use to inject insulin 4 times daily as instructed. 04/05/15   Renato Shin, MD  Insulin Syringe-Needle U-100 31G X 5/16" 0.3 ML MISC 1 Device by Does not apply route 3 (three) times daily. 01/29/15   Renato Shin, MD  nystatin-triamcinolone (MYCOLOG II) cream Apply to rash on abdomen daily for 10 days. Patient not taking: Reported on 09/29/2015 08/25/15   Evalee Jefferson, PA-C  traMADol (ULTRAM) 50 MG tablet Take 1 tablet (50 mg total) by mouth every 6 (six) hours as needed. Patient not taking: Reported on 09/29/2015 05/21/15   Gareth Morgan, MD   BP 136/94 mmHg  Pulse 112   Temp(Src) 98.2 F (36.8 C) (Oral)  Resp 20  Ht _0  (1.626 m)  Wt 262 lb (118.842 kg)  BMI 44.95 kg/m2  SpO2 98%  LMP 09/01/2015 Physical Exam  Constitutional: She is oriented to person, place, and time. She appears well-developed and well-nourished. No distress.  HENT:  Head: Normocephalic.  Eyes: Conjunctivae are normal.  Neck: Neck supple. No tracheal deviation present.  Cardiovascular: Normal rate, regular rhythm and normal heart sounds.   Pulmonary/Chest: Effort normal and breath sounds normal. No respiratory distress.  Abdominal: Soft. She exhibits no distension. There is no tenderness. There is CVA tenderness (left). There is no rebound and no guarding.  Neurological: She is alert and oriented to person, place, and time.  Skin: Skin is warm and dry.  Psychiatric: She has a normal mood and affect.  Vitals reviewed.   ED Course  Procedures (including critical care time) Labs Review Labs Reviewed  URINALYSIS, ROUTINE W REFLEX MICROSCOPIC (NOT AT [facility]) - Abnormal; Notable for the following:    Glucose, UA >1000 (*)    Hgb urine dipstick SMALL (*)    Protein, ur >300 (*)    All other components within normal limits  BASIC METABOLIC PANEL - Abnormal; Notable for the following:    Sodium 134 (*)    Chloride 100 (*)    Glucose, Bld 270 (*)    All other components within normal limits  URINE MICROSCOPIC-ADD ON - Abnormal; Notable for the following:    Squamous Epithelial / LPF 0-5 (*)    All other components within normal limits  URINE CULTURE  CBC WITH DIFFERENTIAL/PLATELET  POC URINE PREG, ED    Imaging Review Ct Renal Stone Study  09/29/2015  CLINICAL DATA:  Left-sided flank pain radiating to left abdomen for the past 4 days. History of kidney stones. Right kidney has been removed due to kidney stones. EXAM: CT ABDOMEN AND PELVIS WITHOUT CONTRAST TECHNIQUE: Multidetector CT imaging of the abdomen and pelvis was performed following the standard protocol without IV  contrast. COMPARISON:  CT abdomen dated 05/21/2015. FINDINGS: Lower chest:  No acute findings. Hepatobiliary: Liver and gallbladder appear normal. Pancreas: No mass or inflammatory process identified on this un-enhanced exam. Spleen: Within normal limits in size. Adrenals/Urinary Tract: Stable low-density right adrenal mass, compatible with benign adenoma. Surgical absence of the right kidney. 3 mm nonobstructing left renal stone. No hydronephrosis. No ureteral or bladder calculi. Bladder appears normal. Stomach/Bowel: Bowel is normal in caliber. No bowel wall thickening or evidence of bowel wall inflammation. Appendix is normal. Stomach is normal. Vascular/Lymphatic: Abdominal aorta is normal in caliber. No enlarged lymph nodes seen. Reproductive: No mass or other significant abnormality. Other: No free fluid or abscess collection. No free intraperitoneal air. Musculoskeletal: Mild degenerative change within the slightly scoliotic thoracolumbar spine. No acute or suspicious osseous lesion. Superficial soft tissues are  unremarkable. Small periumbilical abdominal wall hernia which contains fat only. IMPRESSION: 1. No acute findings within the abdomen or pelvis. 2. Stable 3 mm nonobstructing left renal stone. No associated hydronephrosis. No ureteral or bladder calculi. 3. Chronic/incidental findings detailed above. Electronically Signed   By: Franki Cabot M.D.   On: 09/29/2015 16:38   I have personally reviewed and evaluated these images and lab results as part of my medical decision-making.   EKG Interpretation None      MDM   Final diagnoses:  Left flank pain   35 y.o. female presents with recurrent left flank pain. Has solitary kidney and history of staghorn calculus on the right. Pain is out of proportion to exam, CT ordered which shows stable stone from prior and no evidence of obstruction, additional offending stone or other concerning features. No signs of infection. Suspect MSK pain or other  alternate etiology. Cr at baseline. Plan to follow up with PCP as needed and return precautions discussed for worsening or new concerning symptoms.     Leo Grosser, MD 09/30/15 0111

## 2015-09-30 LAB — URINE CULTURE

## 2015-10-21 ENCOUNTER — Encounter (HOSPITAL_BASED_OUTPATIENT_CLINIC_OR_DEPARTMENT_OTHER): Payer: Self-pay | Admitting: *Deleted

## 2015-10-21 ENCOUNTER — Emergency Department (HOSPITAL_BASED_OUTPATIENT_CLINIC_OR_DEPARTMENT_OTHER): Payer: BLUE CROSS/BLUE SHIELD

## 2015-10-21 DIAGNOSIS — S92412A Displaced fracture of proximal phalanx of left great toe, initial encounter for closed fracture: Secondary | ICD-10-CM | POA: Diagnosis not present

## 2015-10-21 DIAGNOSIS — Y999 Unspecified external cause status: Secondary | ICD-10-CM | POA: Diagnosis not present

## 2015-10-21 DIAGNOSIS — Z794 Long term (current) use of insulin: Secondary | ICD-10-CM | POA: Insufficient documentation

## 2015-10-21 DIAGNOSIS — E039 Hypothyroidism, unspecified: Secondary | ICD-10-CM | POA: Diagnosis not present

## 2015-10-21 DIAGNOSIS — W208XXA Other cause of strike by thrown, projected or falling object, initial encounter: Secondary | ICD-10-CM | POA: Insufficient documentation

## 2015-10-21 DIAGNOSIS — Y929 Unspecified place or not applicable: Secondary | ICD-10-CM | POA: Insufficient documentation

## 2015-10-21 DIAGNOSIS — Y939 Activity, unspecified: Secondary | ICD-10-CM | POA: Insufficient documentation

## 2015-10-21 DIAGNOSIS — S99922A Unspecified injury of left foot, initial encounter: Secondary | ICD-10-CM | POA: Diagnosis present

## 2015-10-21 DIAGNOSIS — E119 Type 2 diabetes mellitus without complications: Secondary | ICD-10-CM | POA: Insufficient documentation

## 2015-10-21 NOTE — ED Notes (Signed)
A butcher block counter fell onto her left foot. Laceration to her great toe. Bleeding controlled.

## 2015-10-22 ENCOUNTER — Emergency Department (HOSPITAL_BASED_OUTPATIENT_CLINIC_OR_DEPARTMENT_OTHER)
Admission: EM | Admit: 2015-10-22 | Discharge: 2015-10-22 | Disposition: A | Discharge status: Home / Self Care | Payer: BLUE CROSS/BLUE SHIELD | Attending: Emergency Medicine | Admitting: Emergency Medicine

## 2015-10-22 ENCOUNTER — Encounter (HOSPITAL_BASED_OUTPATIENT_CLINIC_OR_DEPARTMENT_OTHER): Payer: Self-pay | Admitting: Emergency Medicine

## 2015-10-22 DIAGNOSIS — S92912A Unspecified fracture of left toe(s), initial encounter for closed fracture: Secondary | ICD-10-CM

## 2015-10-22 DIAGNOSIS — S91119A Laceration without foreign body of unspecified toe without damage to nail, initial encounter: Secondary | ICD-10-CM

## 2015-10-22 DIAGNOSIS — S90222A Contusion of left lesser toe(s) with damage to nail, initial encounter: Secondary | ICD-10-CM

## 2015-10-22 MED ORDER — NAPROXEN 500 MG PO TABS: 500.0000 mg | ORAL_TABLET | Freq: Two times a day (BID) | ORAL | Status: DC

## 2015-10-22 MED ORDER — CIPROFLOXACIN HCL 500 MG PO TABS
500.0000 mg | ORAL_TABLET | Freq: Once | ORAL | Status: AC
  Administered 2015-10-22: 500 mg via ORAL
  Filled 2015-10-22: qty 1

## 2015-10-22 MED ORDER — ONDANSETRON 8 MG PO TBDP
ORAL_TABLET | ORAL | Status: AC
  Administered 2015-10-22: 8 mg
  Filled 2015-10-22: qty 1

## 2015-10-22 MED ORDER — OXYCODONE-ACETAMINOPHEN 5-325 MG PO TABS
1.0000 | ORAL_TABLET | Freq: Once | ORAL | Status: AC
  Administered 2015-10-22: 1 via ORAL
  Filled 2015-10-22: qty 1

## 2015-10-22 MED ORDER — HYDROCODONE-ACETAMINOPHEN 5-325 MG PO TABS: 1.0000 | ORAL_TABLET | Freq: Four times a day (QID) | ORAL | Status: DC | PRN

## 2015-10-22 MED ORDER — NAPROXEN 250 MG PO TABS
500.0000 mg | ORAL_TABLET | Freq: Once | ORAL | Status: AC
  Administered 2015-10-22: 500 mg via ORAL
  Filled 2015-10-22: qty 2

## 2015-10-22 MED ORDER — LIDOCAINE HCL 2 % IJ SOLN
15.0000 mL | Freq: Once | INTRAMUSCULAR | Status: AC
  Administered 2015-10-22: 10 mg
  Filled 2015-10-22: qty 20

## 2015-10-22 NOTE — ED Provider Notes (Signed)
CSN: [id number]     Arrival date & time 10/21/15  2302 History  By signing my name below, I, Dyke Brackett, attest that this documentation has been prepared under the direction and in the presence of Terril Chestnut, MD . Electronically Signed: Dyke Brackett, Scribe. 10/22/2015. 12:34 AM.    Chief Complaint  Patient presents with  . Foot Injury    Patient is a 35 y.o. female presenting with skin laceration. The history is provided by the patient. No language interpreter was used.  Laceration Location:  Toe Toe laceration location:  L great toe Depth:  Through dermis Quality: straight   Bleeding: controlled   Injury mechanism: butcher block dropped on toe causing subungual hematoma and and fracture. Pain details:    Quality:  Throbbing   Severity:  Severe   Timing:  Constant   Progression:  Unchanged Foreign body present:  No foreign bodies Relieved by:  Nothing Worsened by:  Nothing tried Ineffective treatments:  None tried Tetanus status:  Up to date  HPI Comments:  Alison Fitzgerald is a 35 y.o. female who presents to the Emergency Department complaining of sudden onset, sharp left toe pain with radiation to left foot onset today s/p trauma. She states a butcher block counter fell onto her foot, cutting the exposed toe. Pt was wearing sandals at the time on incident. She states her last tetanus shot was about 5 years ago. No alleviating factors noted.  Past Medical History  Diagnosis Date  . Diabetes mellitus without complication ([place])   . Hypothyroidism   . Kidney stones   . [place] kidney stones     right nephrectomy    Past Surgical History  Procedure Laterality Date  . Nephrectomy    . Cesarean section     Family History  Problem Relation Age of Onset  . Ovarian cancer Paternal Grandmother   . Diabetes Maternal Grandmother   . Diabetes Mother    Social History  Substance Use Topics  . Smoking status: Never Smoker   . Smokeless tobacco: Never Used  .  Alcohol Use: No   OB History    No data available     Review of Systems  Musculoskeletal: Positive for arthralgias.  Skin: Positive for wound.  All other systems reviewed and are negative.  Allergies  Doxycycline; Toradol; Food; Indomethacin; Other; and Prednisone  Home Medications   Prior to Admission medications   Medication Sig Start Date End Date Taking? Authorizing Provider  allopurinol (ZYLOPRIM) 100 MG tablet Take 100 mg by mouth at bedtime.  04/15/15   Historical Provider, MD  BAYER MICROLET LANCETS lancets Use to check blood sugar 1 time per day. 04/12/15   Renato Shin, MD  Blood Glucose Monitoring Suppl (BAYER CONTOUR NEXT MONITOR) w/Device KIT Use to check blood sugar 1 time per day. 04/12/15   Renato Shin, MD  captopril (CAPOTEN) 25 MG tablet Take 25 mg by mouth 2 (two) times daily. 04/05/15   Historical Provider, MD  colchicine 0.6 MG tablet Take 1 tablet (0.6 mg total) by mouth 2 (two) times daily. Patient not taking: Reported on 09/29/2015 03/22/15   Rolland Porter, MD  glucose blood (BAYER CONTOUR NEXT TEST) test strip Use to check blood sugar 1 time per day. 04/12/15   Renato Shin, MD  HYDROcodone-acetaminophen (NORCO/VICODIN) 5-325 MG tablet Take 1 tablet by mouth every 4 (four) hours as needed. 08/25/15   Evalee Jefferson, PA-C  Insulin Glargine (LANTUS SOLOSTAR) 100 UNIT/ML Solostar Pen Inject 20 Units into the  skin daily at 10 pm. 04/05/15   Renato Shin, MD  Insulin Pen Needle 32G X 4 MM MISC Use to inject insulin 4 times daily as instructed. 04/05/15   Renato Shin, MD  insulin regular (NOVOLIN R,HUMULIN R) 100 units/mL injection Inject 0.1 mLs (10 Units total) into the skin 3 (three) times daily before meals. Patient taking differently: Inject 10 Units into the skin 3 (three) times daily before meals. Additional 2 units if bs>250, 5 units if bs >300 01/29/15   Renato Shin, MD  Insulin Syringe-Needle U-100 31G X 5/16" 0.3 ML MISC 1 Device by Does not apply route 3 (three) times  daily. 01/29/15   Renato Shin, MD  levothyroxine (SYNTHROID, LEVOTHROID) 112 MCG tablet 2 tabs daily 01/29/15   Renato Shin, MD  metoprolol tartrate (LOPRESSOR) 25 MG tablet Take 1 tablet (25 mg total) by mouth 2 (two) times daily. 02/18/15   Thayer Headings, MD  nystatin-triamcinolone (MYCOLOG II) cream Apply to rash on abdomen daily for 10 days. Patient not taking: Reported on 09/29/2015 08/25/15   Evalee Jefferson, PA-C  potassium citrate (UROCIT-K) 10 MEQ (1080 MG) SR tablet Take 10 mEq by mouth 4 (four) times daily.    Historical Provider, MD  traMADol (ULTRAM) 50 MG tablet Take 1 tablet (50 mg total) by mouth every 6 (six) hours as needed. Patient not taking: Reported on 09/29/2015 05/21/15   Gareth Morgan, MD   BP 131/97 mmHg  Pulse 104  Temp(Src) 98.6 F (37 C) (Oral)  Resp 20  Ht _0  (1.626 m)  Wt 262 lb (118.842 kg)  BMI 44.95 kg/m2  SpO2 99%  LMP 10/02/2015 Physical Exam  Constitutional: She is oriented to person, place, and time. She appears well-developed and well-nourished. No distress.  HENT:  Head: Normocephalic and atraumatic.  Mouth/Throat: Oropharynx is clear and moist. No oropharyngeal exudate.  Moist mucous membranes   Eyes: Conjunctivae are normal. Pupils are equal, round, and reactive to light.  Neck: No JVD present.  Trachea midline No bruit  Cardiovascular: Normal rate, regular rhythm and normal heart sounds.   3+ DP  Pulmonary/Chest: Effort normal. No stridor. No respiratory distress. She has no wheezes. She has no rales.  Abdominal: Soft. Bowel sounds are normal. She exhibits no distension. There is no rebound and no guarding.  Musculoskeletal: Normal range of motion.       Feet:  Intact achilles tendon  Neurological: She is alert and oriented to person, place, and time. She has normal reflexes.  Skin: Skin is warm and dry.  Jagged laceration of left great toe 100% subungal hematoma Ecchymosis of toenail   Psychiatric: She has a normal mood and affect.  Her behavior is normal.  Nursing note and vitals reviewed.   ED Course  .Marland KitchenLaceration Repair Date/Time: 10/22/2015 12:56 AM Performed by: Veatrice Kells Authorized by: Veatrice Kells Consent: Verbal consent obtained. Patient identity confirmed: arm band Body area: lower extremity Location details: left big toe Laceration length: 1 cm Tendon involvement: none Nerve involvement: none Vascular damage: no Anesthesia: nerve block Local anesthetic: lidocaine 2% without epinephrine Anesthetic total: 3 ml Patient sedated: no Irrigation solution: saline Amount of cleaning: extensive Debridement: moderate Degree of undermining: none Skin closure: 3-0 nylon and Ethilon Number of sutures: 3 Technique: simple Approximation: close Approximation difficulty: simple Dressing: 4x4 sterile gauze Patient tolerance: Patient tolerated the procedure well with no immediate complications    DIAGNOSTIC STUDIES:  Oxygen Saturation is 99 on RA, normal by my interpretation.  COORDINATION OF CARE:  12:24 AM Discussed treatment plan with pt at bedside and pt agreed to plan.  Labs Review Labs Reviewed - No data to display  Imaging Review Dg Foot Complete Left  10/21/2015  CLINICAL DATA:  Pain after trauma EXAM: LEFT FOOT - COMPLETE 3+ VIEW COMPARISON:  None. FINDINGS: There is a comminuted fracture of the tuft of the great toe. Overlying gauze limits evaluation for foreign bodies but none are definitively seen. No other acute abnormalities. IMPRESSION: Comminuted fracture of the tuft of the great toe. Electronically Signed   By: Dorise Bullion III M.D   On: 10/21/2015 23:51   I have personally reviewed and evaluated these images and lab results as part of my medical decision-making.   EKG Interpretation None      MDM   Final diagnoses:  None   Filed Vitals:   10/21/15 2310 10/22/15 0308  BP: 131/97 117/73  Pulse: 104 93  Temp: 98.6 F (37 C)   Resp: 20 18   Results for orders  placed or performed during the hospital encounter of 09/29/15  Urine culture  Result Value Ref Range   Specimen Description URINE, CLEAN CATCH    Special Requests NONE    Culture MULTIPLE SPECIES PRESENT, SUGGEST RECOLLECTION (A)    Report Status 09/30/2015 FINAL   Urinalysis, Routine w reflex microscopic- may I&O cath if menses  Result Value Ref Range   Color, Urine YELLOW YELLOW   APPearance CLEAR CLEAR   Specific Gravity, Urine 1.028 1.005 - 1.030   pH 6.5 5.0 - 8.0   Glucose, UA >1000 (A) NEGATIVE mg/dL   Hgb urine dipstick SMALL (A) NEGATIVE   Bilirubin Urine NEGATIVE NEGATIVE   Ketones, ur NEGATIVE NEGATIVE mg/dL   Protein, ur >300 (A) NEGATIVE mg/dL   Nitrite NEGATIVE NEGATIVE   Leukocytes, UA NEGATIVE NEGATIVE  CBC with Differential/Platelet  Result Value Ref Range   WBC 6.7 4.0 - 10.5 K/uL   RBC 4.54 3.87 - 5.11 MIL/uL   Hemoglobin 14.2 12.0 - 15.0 g/dL   HCT 41.5 36.0 - 46.0 %   MCV 91.4 78.0 - 100.0 fL   MCH 31.3 26.0 - 34.0 pg   MCHC 34.2 30.0 - 36.0 g/dL   RDW 13.5 11.5 - 15.5 %   Platelets 328 150 - 400 K/uL   Neutrophils Relative % 56 %   Neutro Abs 3.8 1.7 - 7.7 K/uL   Lymphocytes Relative 34 %   Lymphs Abs 2.2 0.7 - 4.0 K/uL   Monocytes Relative 6 %   Monocytes Absolute 0.4 0.1 - 1.0 K/uL   Eosinophils Relative 4 %   Eosinophils Absolute 0.3 0.0 - 0.7 K/uL   Basophils Relative 0 %   Basophils Absolute 0.0 0.0 - 0.1 K/uL  Basic metabolic panel  Result Value Ref Range   Sodium 134 (L) 135 - 145 mmol/L   Potassium 4.1 3.5 - 5.1 mmol/L   Chloride 100 (L) 101 - 111 mmol/L   CO2 25 22 - 32 mmol/L   Glucose, Bld 270 (H) 65 - 99 mg/dL   BUN 18 6 - 20 mg/dL   Creatinine, Ser 0.97 0.44 - 1.00 mg/dL   Calcium 9.1 8.9 - 10.3 mg/dL   GFR calc non Af Amer >60 >60 mL/min   GFR calc Af Amer >60 >60 mL/min   Anion gap 9 5 - 15  Urine microscopic-add on  Result Value Ref Range   Squamous Epithelial / LPF 0-5 (A) NONE SEEN  WBC, UA 0-5 0 - 5 WBC/hpf   RBC /  HPF 0-5 0 - 5 RBC/hpf   Bacteria, UA NONE SEEN NONE SEEN  POC urine preg, ED  Result Value Ref Range   Preg Test, Ur NEGATIVE NEGATIVE   Dg Foot Complete Left  10/21/2015  CLINICAL DATA:  Pain after trauma EXAM: LEFT FOOT - COMPLETE 3+ VIEW COMPARISON:  None. FINDINGS: There is a comminuted fracture of the tuft of the great toe. Overlying gauze limits evaluation for foreign bodies but none are definitively seen. No other acute abnormalities. IMPRESSION: Comminuted fracture of the tuft of the great toe. Electronically Signed   By: Dorise Bullion III M.D   On: 10/21/2015 23:51   Ct Renal Stone Study  09/29/2015  CLINICAL DATA:  Left-sided flank pain radiating to left abdomen for the past 4 days. History of kidney stones. Right kidney has been removed due to kidney stones. EXAM: CT ABDOMEN AND PELVIS WITHOUT CONTRAST TECHNIQUE: Multidetector CT imaging of the abdomen and pelvis was performed following the standard protocol without IV contrast. COMPARISON:  CT abdomen dated 05/21/2015. FINDINGS: Lower chest:  No acute findings. Hepatobiliary: Liver and gallbladder appear normal. Pancreas: No mass or inflammatory process identified on this un-enhanced exam. Spleen: Within normal limits in size. Adrenals/Urinary Tract: Stable low-density right adrenal mass, compatible with benign adenoma. Surgical absence of the right kidney. 3 mm nonobstructing left renal stone. No hydronephrosis. No ureteral or bladder calculi. Bladder appears normal. Stomach/Bowel: Bowel is normal in caliber. No bowel wall thickening or evidence of bowel wall inflammation. Appendix is normal. Stomach is normal. Vascular/Lymphatic: Abdominal aorta is normal in caliber. No enlarged lymph nodes seen. Reproductive: No mass or other significant abnormality. Other: No free fluid or abscess collection. No free intraperitoneal air. Musculoskeletal: Mild degenerative change within the slightly scoliotic thoracolumbar spine. No acute or suspicious  osseous lesion. Superficial soft tissues are unremarkable. Small periumbilical abdominal wall hernia which contains fat only. IMPRESSION: 1. No acute findings within the abdomen or pelvis. 2. Stable 3 mm nonobstructing left renal stone. No associated hydronephrosis. No ureteral or bladder calculi. 3. Chronic/incidental findings detailed above. Electronically Signed   By: Franki Cabot M.D.   On: 09/29/2015 16:38   Medications  oxyCODONE-acetaminophen (PERCOCET/ROXICET) 5-325 MG per tablet 1 tablet (1 tablet Oral Given 10/22/15 0202)  ciprofloxacin (CIPRO) tablet 500 mg (500 mg Oral Given 10/22/15 0202)  lidocaine (XYLOCAINE) 2 % (with pres) injection 300 mg (10 mg Infiltration Given 10/22/15 0201)  ondansetron (ZOFRAN-ODT) 8 MG disintegrating tablet (8 mg  Given 10/22/15 0212)  naproxen (NAPROSYN) tablet 500 mg (500 mg Oral Given 10/22/15 0303)    Patient reports she is not allergic to naproxen, Ginny present as witness  110 am case d/w Dr. Delfino Lovett remove the nail and close laceration place in hard soled shoe  Patient refused toe nail removal but did consent to laceration repair   Patient to follow up with orthopedics.  Wait 24 hours to change bandage wear closed toed hard soled shoes with clean white socks ice the foot for 20 minutes Q 2 hours.  All questions answered to patient's satisfaction. Based on history and exam patient has been appropriately medically screened and emergency conditions excluded. Patient is stable for discharge at this time. Follow up provided and strict return precautions given.  RX for vicodin and naproxen   I personally performed the services described in this documentation, which was scribed in my presence. The recorded information has been reviewed and is  accurate.       Veatrice Kells, MD 10/22/15 [phone]

## 2015-10-28 ENCOUNTER — Encounter: Payer: BLUE CROSS/BLUE SHIELD | Attending: General Surgery

## 2015-10-28 DIAGNOSIS — Z713 Dietary counseling and surveillance: Secondary | ICD-10-CM | POA: Diagnosis not present

## 2015-10-28 DIAGNOSIS — E119 Type 2 diabetes mellitus without complications: Secondary | ICD-10-CM

## 2015-10-30 NOTE — Progress Notes (Signed)
  Pre-Operative Nutrition Class:  Appt start time: 4585   End time:  1830.  Patient was seen on 10/28/2015 for Pre-Operative Bariatric Surgery Education at [facility].   Surgery date:  Surgery type: sleeve gastrectomy [facility] weight at [facility]: 271 lbs on 06/26/2015 Weight today: 269 lbs  TANITA  BODY COMP RESULTS  10/28/15   BMI (kg/m^2) 46.2   Fat Mass (lbs) 114.6   Fat Free Mass (lbs) 154.4   Total Body Water (lbs) 113.6   Samples given per MNT protocol. Patient educated on appropriate usage: Bariatric Advantage Calcium Citrate (caramel - qty 1) Lot #: [id number] Exp: 02/2016  Premier protein shake (strawberry - qty 1) Lot #: [id number] Exp: 08/2016  Unjury protein powder (vanilla - qty 1) Lot #: [id number] Exp: 12/2015  The following the learning objectives were met by the patient during this course:  Identify Pre-Op Dietary Goals and will begin 2 weeks pre-operatively  Identify appropriate sources of fluids and proteins   State protein recommendations and appropriate sources pre and post-operatively  Identify Post-Operative Dietary Goals and will follow for 2 weeks post-operatively  Identify appropriate multivitamin and calcium sources  Describe the need for physical activity post-operatively and will follow MD recommendations  State when to call healthcare provider regarding medication questions or post-operative complications  Handouts given during class include:  Pre-Op Bariatric Surgery Diet Handout  Protein Shake Handout  Post-Op Bariatric Surgery Nutrition Handout  BELT Program Information Flyer  Support Group Information Flyer  [facility] Bariatric Supplements Price List  Follow-Up Plan: Patient will follow-up at [facility] 2 weeks post operatively for diet advancement per MD.

## 2015-10-31 ENCOUNTER — Ambulatory Visit: Payer: Self-pay | Admitting: General Surgery

## 2015-11-06 ENCOUNTER — Telehealth: Payer: Self-pay | Admitting: Endocrinology

## 2015-11-06 MED ORDER — INSULIN GLARGINE 100 UNIT/ML SOLOSTAR PEN: 20.0000 [IU] | PEN_INJECTOR | Freq: Every day | SUBCUTANEOUS | 1 refills | Status: DC

## 2015-11-06 MED ORDER — "INSULIN SYRINGE-NEEDLE U-100 31G X 5/16"" 0.3 ML MISC": 1.0000 | Freq: Three times a day (TID) | 3 refills | Status: DC

## 2015-11-06 MED ORDER — INSULIN PEN NEEDLE 32G X 4 MM MISC: 5 refills | Status: DC

## 2015-11-06 MED ORDER — INSULIN REGULAR HUMAN 100 UNIT/ML IJ SOLN: 10.0000 [IU] | Freq: Three times a day (TID) | INTRAMUSCULAR | 3 refills | Status: DC

## 2015-11-06 MED ORDER — LEVOTHYROXINE SODIUM 112 MCG PO TABS: ORAL_TABLET | ORAL | 5 refills | Status: DC

## 2015-11-06 NOTE — Telephone Encounter (Signed)
novolin and the needles lantus pens and the needles for the pens Levothyroxine Called into [facility] in [place]

## 2015-11-06 NOTE — Telephone Encounter (Signed)
Rx's submitted per pt's request.  

## 2015-11-08 MED ORDER — INSULIN REGULAR HUMAN 100 UNIT/ML IJ SOLN: 10.0000 [IU] | Freq: Three times a day (TID) | INTRAMUSCULAR | 4 refills | Status: DC

## 2015-11-08 NOTE — Telephone Encounter (Signed)
Pt is calling for clarification on the humulin and or novolin r

## 2015-11-08 NOTE — Telephone Encounter (Signed)
I contacted the pt. Pt advised we have submitted the prescription for the novolin R per her request. PT voiced understanding.

## 2015-11-13 NOTE — Patient Instructions (Addendum)
Alison Fitzgerald  11/13/2015   Your procedure is scheduled on: 11/19/2015    Report to [facility] Main  Entrance take [place]  elevators to 3rd floor to  [place] at    [place] AM.  Call this number if you have problems the morning of surgery [phone]   Remember: ONLY 1 PERSON MAY GO WITH YOU TO SHORT STAY TO GET  READY MORNING OF YOUR SURGERY.  Do not eat food or drink liquids :After Midnight.     Take these medicines the morning of surgery with A SIP OF WATER: , 1/2 of evening dose of Insulin nite before surgery , synthroid, DO NOT TAKE ANY DIABETIC MEDICATIONS DAY OF YOUR SURGERY                               You may not have any metal on your body including hair pins and              piercings  Do not wear jewelry, make-up, lotions, powders or perfumes, deodorant             Do not wear nail polish.  Do not shave  48 hours prior to surgery.            Do not bring valuables to the hospital. Alison Fitzgerald.  Contacts, dentures or bridgework may not be worn into surgery.  Leave suitcase in the car. After surgery it may be brought to your room.      Special Instructions: coughing and deep breathing exercises, leg exercises               Please read over the following fact sheets you were given: _____________________________________________________________________             [facility] - Preparing for Surgery Before surgery, you can play an important role.  Because skin is not sterile, your skin needs to be as free of germs as possible.  You can reduce the number of germs on your skin by washing with CHG (chlorahexidine gluconate) soap before surgery.  CHG is an antiseptic cleaner which kills germs and bonds with the skin to continue killing germs even after washing. Please DO NOT use if you have an allergy to CHG or antibacterial soaps.  If your skin becomes reddened/irritated stop using the CHG  and inform your nurse when you arrive at Short Stay. Do not shave (including legs and underarms) for at least 48 hours prior to the first CHG shower.  You may shave your face/neck. Please follow these instructions carefully:  1.  Shower with CHG Soap the night before surgery and the  morning of Surgery.  2.  If you choose to wash your hair, wash your hair first as usual with your  normal  shampoo.  3.  After you shampoo, rinse your hair and body thoroughly to remove the  shampoo.                           4.  Use CHG as you would any other liquid soap.  You can apply chg directly  to the skin and wash  Gently with a scrungie or clean washcloth.  5.  Apply the CHG Soap to your body ONLY FROM THE NECK DOWN.   Do not use on face/ open                           Wound or open sores. Avoid contact with eyes, ears mouth and genitals (private parts).                       Wash face,  Genitals (private parts) with your normal soap.             6.  Wash thoroughly, paying special attention to the area where your surgery  will be performed.  7.  Thoroughly rinse your body with warm water from the neck down.  8.  DO NOT shower/wash with your normal soap after using and rinsing off  the CHG Soap.                9.  Pat yourself dry with a clean towel.            10.  Wear clean pajamas.            11.  Place clean sheets on your bed the night of your first shower and do not  sleep with pets. Day of Surgery : Do not apply any lotions/deodorants the morning of surgery.  Please wear clean clothes to the hospital/surgery center.  FAILURE TO FOLLOW THESE INSTRUCTIONS MAY RESULT IN THE CANCELLATION OF YOUR SURGERY PATIENT SIGNATURE_________________________________  NURSE SIGNATURE__________________________________  ________________________________________________________________________

## 2015-11-15 ENCOUNTER — Encounter (HOSPITAL_COMMUNITY): Payer: Self-pay

## 2015-11-15 ENCOUNTER — Encounter (HOSPITAL_COMMUNITY)
Admission: RE | Admit: 2015-11-15 | Discharge: 2015-11-15 | Disposition: A | Discharge status: Home / Self Care | Payer: BLUE CROSS/BLUE SHIELD | Source: Ambulatory Visit | Attending: General Surgery | Admitting: General Surgery

## 2015-11-15 DIAGNOSIS — Z01812 Encounter for preprocedural laboratory examination: Secondary | ICD-10-CM | POA: Insufficient documentation

## 2015-11-15 HISTORY — DX: Nausea with vomiting, unspecified: R11.2

## 2015-11-15 HISTORY — DX: Other specified postprocedural states: Z98.890

## 2015-11-15 HISTORY — DX: Unspecified injury of left foot, initial encounter: S99.922A

## 2015-11-15 LAB — COMPREHENSIVE METABOLIC PANEL
ALBUMIN: 3.4 g/dL — AB (ref 3.5–5.0)
ALK PHOS: 69 U/L (ref 38–126)
ALT: 29 U/L (ref 14–54)
ANION GAP: 10 (ref 5–15)
AST: 26 U/L (ref 15–41)
BILIRUBIN TOTAL: 0.5 mg/dL (ref 0.3–1.2)
BUN: 21 mg/dL — ABNORMAL HIGH (ref 6–20)
CO2: 24 mmol/L (ref 22–32)
Calcium: 9.2 mg/dL (ref 8.9–10.3)
Chloride: 102 mmol/L (ref 101–111)
Creatinine, Ser: 0.92 mg/dL (ref 0.44–1.00)
GFR calc Af Amer: >60 mL/min (ref >60)
Glucose, Bld: 144 mg/dL — ABNORMAL HIGH (ref 65–99)
POTASSIUM: 4 mmol/L (ref 3.5–5.1)
Sodium: 136 mmol/L (ref 135–145)
TOTAL PROTEIN: 7.1 g/dL (ref 6.5–8.1)

## 2015-11-15 LAB — CBC WITH DIFFERENTIAL/PLATELET
BASOS PCT: 0 %
Basophils Absolute: 0 10*3/uL (ref 0.0–0.1)
Eosinophils Absolute: 0.1 10*3/uL (ref 0.0–0.7)
Eosinophils Relative: 2 %
HEMATOCRIT: 39.4 % (ref 36.0–46.0)
Hemoglobin: 13.7 g/dL (ref 12.0–15.0)
LYMPHS PCT: 24 %
Lymphs Abs: 1.4 10*3/uL (ref 0.7–4.0)
MCH: 31.7 pg (ref 26.0–34.0)
MCHC: 34.8 g/dL (ref 30.0–36.0)
MCV: 91.2 fL (ref 78.0–100.0)
MONO ABS: 0.2 10*3/uL (ref 0.1–1.0)
MONOS PCT: 4 %
NEUTROS ABS: 4.1 10*3/uL (ref 1.7–7.7)
Neutrophils Relative %: 70 %
Platelets: 282 10*3/uL (ref 150–400)
RBC: 4.32 MIL/uL (ref 3.87–5.11)
RDW: 13.2 % (ref 11.5–15.5)
WBC: 5.9 10*3/uL (ref 4.0–10.5)

## 2015-11-15 MED FILL — oxyCODONE HCL 5 MG/5ML SOLN: 5 | 3 days supply | Qty: 200 | Fill #0

## 2015-11-18 ENCOUNTER — Encounter (HOSPITAL_COMMUNITY): Payer: Self-pay

## 2015-11-18 LAB — HEMOGLOBIN A1C
Hgb A1c MFr Bld: 10 % — ABNORMAL HIGH (ref 4.8–5.6)
Mean Plasma Glucose: 240 mg/dL

## 2015-11-18 NOTE — Progress Notes (Addendum)
HGBA1C done 11/15/2015 routed via EPIC to Dr Kieth Brightly.  EKG_2/24/17- EPIC  05/31/15- CXR- EPIC

## 2015-11-19 ENCOUNTER — Encounter (HOSPITAL_COMMUNITY): Payer: Self-pay

## 2015-11-19 ENCOUNTER — Inpatient Hospital Stay (HOSPITAL_COMMUNITY)
Admission: RE | Admit: 2015-11-19 | Discharge: 2015-11-20 | DRG: 621 | Disposition: A | Discharge status: Home / Self Care | Payer: BLUE CROSS/BLUE SHIELD | Source: Ambulatory Visit | Attending: General Surgery | Admitting: General Surgery

## 2015-11-19 ENCOUNTER — Inpatient Hospital Stay (HOSPITAL_COMMUNITY): Payer: BLUE CROSS/BLUE SHIELD | Admitting: Certified Registered Nurse Anesthetist

## 2015-11-19 ENCOUNTER — Encounter (HOSPITAL_COMMUNITY)
Admission: RE | Disposition: A | Discharge status: Home / Self Care | Payer: Self-pay | Source: Ambulatory Visit | Attending: General Surgery

## 2015-11-19 DIAGNOSIS — G4730 Sleep apnea, unspecified: Secondary | ICD-10-CM | POA: Diagnosis present

## 2015-11-19 DIAGNOSIS — E039 Hypothyroidism, unspecified: Secondary | ICD-10-CM | POA: Diagnosis present

## 2015-11-19 DIAGNOSIS — Z881 Allergy status to other antibiotic agents status: Secondary | ICD-10-CM | POA: Diagnosis not present

## 2015-11-19 DIAGNOSIS — Z794 Long term (current) use of insulin: Secondary | ICD-10-CM | POA: Diagnosis not present

## 2015-11-19 DIAGNOSIS — Z833 Family history of diabetes mellitus: Secondary | ICD-10-CM | POA: Diagnosis not present

## 2015-11-19 DIAGNOSIS — I10 Essential (primary) hypertension: Secondary | ICD-10-CM | POA: Diagnosis present

## 2015-11-19 DIAGNOSIS — Z8041 Family history of malignant neoplasm of ovary: Secondary | ICD-10-CM | POA: Diagnosis not present

## 2015-11-19 DIAGNOSIS — Z87442 Personal history of urinary calculi: Secondary | ICD-10-CM | POA: Diagnosis not present

## 2015-11-19 DIAGNOSIS — Z886 Allergy status to analgesic agent status: Secondary | ICD-10-CM

## 2015-11-19 DIAGNOSIS — Z91018 Allergy to other foods: Secondary | ICD-10-CM

## 2015-11-19 DIAGNOSIS — E119 Type 2 diabetes mellitus without complications: Secondary | ICD-10-CM | POA: Diagnosis present

## 2015-11-19 DIAGNOSIS — Z6841 Body Mass Index (BMI) 40.0 and over, adult: Secondary | ICD-10-CM | POA: Diagnosis not present

## 2015-11-19 DIAGNOSIS — Z888 Allergy status to other drugs, medicaments and biological substances status: Secondary | ICD-10-CM

## 2015-11-19 DIAGNOSIS — Z79899 Other long term (current) drug therapy: Secondary | ICD-10-CM | POA: Diagnosis not present

## 2015-11-19 HISTORY — PX: LAPAROSCOPIC GASTRIC SLEEVE RESECTION: SHX5895

## 2015-11-19 LAB — CBC
HCT: 38.6 % (ref 36.0–46.0)
HEMOGLOBIN: 13 g/dL (ref 12.0–15.0)
MCH: 31.3 pg (ref 26.0–34.0)
MCHC: 33.7 g/dL (ref 30.0–36.0)
MCV: 93 fL (ref 78.0–100.0)
PLATELETS: 265 10*3/uL (ref 150–400)
RBC: 4.15 MIL/uL (ref 3.87–5.11)
RDW: 13.6 % (ref 11.5–15.5)
WBC: 9.1 10*3/uL (ref 4.0–10.5)

## 2015-11-19 LAB — HEMOGLOBIN AND HEMATOCRIT, BLOOD
HEMATOCRIT: 37.9 % (ref 36.0–46.0)
HEMOGLOBIN: 13 g/dL (ref 12.0–15.0)

## 2015-11-19 LAB — GLUCOSE, CAPILLARY
GLUCOSE-CAPILLARY: 139 mg/dL — AB (ref 65–99)
GLUCOSE-CAPILLARY: 224 mg/dL — AB (ref 65–99)
GLUCOSE-CAPILLARY: 204 mg/dL — AB (ref 65–99)
Glucose-Capillary: 249 mg/dL — ABNORMAL HIGH (ref 65–99)

## 2015-11-19 LAB — CREATININE, SERUM
Creatinine, Ser: 0.95 mg/dL (ref 0.44–1.00)
GFR calc Af Amer: >60 mL/min (ref >60)
GFR calc non Af Amer: >60 mL/min (ref >60)

## 2015-11-19 LAB — PREGNANCY, URINE: PREG TEST UR: NEGATIVE

## 2015-11-19 SURGERY — GASTRECTOMY, SLEEVE, LAPAROSCOPIC
Anesthesia: General

## 2015-11-19 MED ORDER — ONDANSETRON HCL 4 MG/2ML IJ SOLN
4.0000 mg | INTRAMUSCULAR | Status: DC | PRN
  Administered 2015-11-19 – 2015-11-20 (×2): 4 mg via INTRAVENOUS
  Filled 2015-11-19 (×2): qty 2

## 2015-11-19 MED ORDER — INSULIN ASPART 100 UNIT/ML ~~LOC~~ SOLN
SUBCUTANEOUS | Status: AC
  Filled 2015-11-19: qty 1

## 2015-11-19 MED ORDER — EPHEDRINE SULFATE 50 MG/ML IJ SOLN
INTRAMUSCULAR | Status: AC
  Filled 2015-11-19: qty 1

## 2015-11-19 MED ORDER — INSULIN ASPART 100 UNIT/ML ~~LOC~~ SOLN
0.0000 [IU] | SUBCUTANEOUS | Status: DC
  Administered 2015-11-19 (×3): 5 [IU] via SUBCUTANEOUS
  Administered 2015-11-19 – 2015-11-20 (×2): 3 [IU] via SUBCUTANEOUS
  Administered 2015-11-20: 2 [IU] via SUBCUTANEOUS

## 2015-11-19 MED ORDER — ONDANSETRON HCL 4 MG/2ML IJ SOLN
INTRAMUSCULAR | Status: DC | PRN
  Administered 2015-11-19: 4 mg via INTRAVENOUS

## 2015-11-19 MED ORDER — DEXAMETHASONE SODIUM PHOSPHATE 10 MG/ML IJ SOLN
INTRAMUSCULAR | Status: DC | PRN
Start: 2015-11-19 — End: 2015-11-19
  Administered 2015-11-19: 10 mg via INTRAVENOUS

## 2015-11-19 MED ORDER — FENTANYL CITRATE (PF) 100 MCG/2ML IJ SOLN
25.0000 ug | INTRAMUSCULAR | Status: DC | PRN
  Administered 2015-11-19: 50 ug via INTRAVENOUS

## 2015-11-19 MED ORDER — SCOPOLAMINE 1 MG/3DAYS TD PT72
MEDICATED_PATCH | TRANSDERMAL | Status: AC
  Filled 2015-11-19: qty 1

## 2015-11-19 MED ORDER — PROPOFOL 10 MG/ML IV BOLUS
INTRAVENOUS | Status: DC | PRN
  Administered 2015-11-19: 200 mg via INTRAVENOUS

## 2015-11-19 MED ORDER — SUGAMMADEX SODIUM 200 MG/2ML IV SOLN
INTRAVENOUS | Status: AC
  Filled 2015-11-19: qty 2

## 2015-11-19 MED ORDER — PROMETHAZINE HCL 25 MG/ML IJ SOLN
INTRAMUSCULAR | Status: AC
  Filled 2015-11-19: qty 1

## 2015-11-19 MED ORDER — LIDOCAINE HCL (CARDIAC) 20 MG/ML IV SOLN
INTRAVENOUS | Status: DC | PRN
  Administered 2015-11-19: 100 mg via INTRAVENOUS

## 2015-11-19 MED ORDER — FENTANYL CITRATE (PF) 100 MCG/2ML IJ SOLN
INTRAMUSCULAR | Status: AC
  Filled 2015-11-19: qty 2

## 2015-11-19 MED ORDER — SUGAMMADEX SODIUM 500 MG/5ML IV SOLN
INTRAVENOUS | Status: AC
  Filled 2015-11-19: qty 5

## 2015-11-19 MED ORDER — PHENYLEPHRINE HCL 10 MG/ML IJ SOLN
INTRAMUSCULAR | Status: DC | PRN
  Administered 2015-11-19 (×2): 80 ug via INTRAVENOUS
  Administered 2015-11-19 (×3): 120 ug via INTRAVENOUS

## 2015-11-19 MED ORDER — SODIUM CHLORIDE 0.9 % IJ SOLN
INTRAMUSCULAR | Status: AC
  Filled 2015-11-19: qty 10

## 2015-11-19 MED ORDER — BUPIVACAINE LIPOSOME 1.3 % IJ SUSP
20.0000 mL | Freq: Once | INTRAMUSCULAR | Status: AC
  Administered 2015-11-19: 20 mL
  Filled 2015-11-19: qty 20

## 2015-11-19 MED ORDER — SODIUM CHLORIDE 0.9 % IJ SOLN
INTRAMUSCULAR | Status: DC | PRN
  Administered 2015-11-19: 20 mL

## 2015-11-19 MED ORDER — CEFOTETAN DISODIUM-DEXTROSE 2-2.08 GM-% IV SOLR
2.0000 g | INTRAVENOUS | Status: AC
  Administered 2015-11-19: 2 g via INTRAVENOUS

## 2015-11-19 MED ORDER — LIDOCAINE HCL (CARDIAC) 20 MG/ML IV SOLN
INTRAVENOUS | Status: AC
  Filled 2015-11-19: qty 5

## 2015-11-19 MED ORDER — LEVOTHYROXINE SODIUM 112 MCG PO TABS
224.0000 ug | ORAL_TABLET | Freq: Every day | ORAL | Status: DC
  Administered 2015-11-20: 224 ug via ORAL
  Filled 2015-11-19: qty 2

## 2015-11-19 MED ORDER — MIDAZOLAM HCL 5 MG/5ML IJ SOLN
INTRAMUSCULAR | Status: DC | PRN
  Administered 2015-11-19: 2 mg via INTRAVENOUS

## 2015-11-19 MED ORDER — ACETAMINOPHEN 160 MG/5ML PO SOLN
325.0000 mg | ORAL | Status: DC | PRN
Start: 2015-11-20 — End: 2015-11-20

## 2015-11-19 MED ORDER — APREPITANT 40 MG PO CAPS
40.0000 mg | ORAL_CAPSULE | ORAL | Status: AC
  Administered 2015-11-19: 40 mg via ORAL

## 2015-11-19 MED ORDER — 0.9 % SODIUM CHLORIDE (POUR BTL) OPTIME
TOPICAL | Status: DC | PRN
  Administered 2015-11-19: 1000 mL

## 2015-11-19 MED ORDER — PROPOFOL 10 MG/ML IV BOLUS
INTRAVENOUS | Status: AC
  Filled 2015-11-19: qty 20

## 2015-11-19 MED ORDER — HEPARIN SODIUM (PORCINE) 5000 UNIT/ML IJ SOLN
5000.0000 [IU] | INTRAMUSCULAR | Status: AC
  Administered 2015-11-19: 5000 [IU] via SUBCUTANEOUS
  Filled 2015-11-19: qty 1

## 2015-11-19 MED ORDER — MEPERIDINE HCL 50 MG/ML IJ SOLN: 6.2500 mg | INTRAMUSCULAR | Status: DC | PRN

## 2015-11-19 MED ORDER — SUCCINYLCHOLINE CHLORIDE 20 MG/ML IJ SOLN
INTRAMUSCULAR | Status: DC | PRN
  Administered 2015-11-19: 140 mg via INTRAVENOUS

## 2015-11-19 MED ORDER — FENTANYL CITRATE (PF) 250 MCG/5ML IJ SOLN
INTRAMUSCULAR | Status: AC
  Filled 2015-11-19: qty 5

## 2015-11-19 MED ORDER — LACTATED RINGERS IV SOLN
INTRAVENOUS | Status: DC
Start: 2015-11-19 — End: 2015-11-19
  Administered 2015-11-19 (×3): via INTRAVENOUS

## 2015-11-19 MED ORDER — ROCURONIUM BROMIDE 100 MG/10ML IV SOLN
INTRAVENOUS | Status: DC | PRN
Start: 2015-11-19 — End: 2015-11-19
  Administered 2015-11-19: 5 mg via INTRAVENOUS
  Administered 2015-11-19: 10 mg via INTRAVENOUS
  Administered 2015-11-19: 5 mg via INTRAVENOUS
  Administered 2015-11-19: 50 mg via INTRAVENOUS

## 2015-11-19 MED ORDER — PROMETHAZINE HCL 25 MG/ML IJ SOLN
6.2500 mg | INTRAMUSCULAR | Status: DC | PRN
  Administered 2015-11-19: 12.5 mg via INTRAVENOUS

## 2015-11-19 MED ORDER — MORPHINE SULFATE (PF) 2 MG/ML IV SOLN
2.0000 mg | INTRAVENOUS | Status: DC | PRN
  Administered 2015-11-19 – 2015-11-20 (×6): 2 mg via INTRAVENOUS
  Filled 2015-11-19 (×6): qty 1

## 2015-11-19 MED ORDER — CHLORHEXIDINE GLUCONATE CLOTH 2 % EX PADS: 6.0000 | MEDICATED_PAD | Freq: Once | CUTANEOUS | Status: DC

## 2015-11-19 MED ORDER — ROCURONIUM BROMIDE 100 MG/10ML IV SOLN
INTRAVENOUS | Status: AC
  Filled 2015-11-19: qty 1

## 2015-11-19 MED ORDER — METOPROLOL TARTRATE 25 MG PO TABS: 25.0000 mg | ORAL_TABLET | Freq: Two times a day (BID) | ORAL | Status: DC

## 2015-11-19 MED ORDER — SUGAMMADEX SODIUM 200 MG/2ML IV SOLN
INTRAVENOUS | Status: DC | PRN
  Administered 2015-11-19: 200 mg via INTRAVENOUS

## 2015-11-19 MED ORDER — EPHEDRINE SULFATE 50 MG/ML IJ SOLN
INTRAMUSCULAR | Status: DC | PRN
  Administered 2015-11-19 (×4): 5 mg via INTRAVENOUS

## 2015-11-19 MED ORDER — LACTATED RINGERS IR SOLN
Status: DC | PRN
  Administered 2015-11-19: 1000 mL

## 2015-11-19 MED ORDER — FENTANYL CITRATE (PF) 100 MCG/2ML IJ SOLN
INTRAMUSCULAR | Status: DC | PRN
  Administered 2015-11-19 (×7): 50 ug via INTRAVENOUS

## 2015-11-19 MED ORDER — ONDANSETRON HCL 4 MG/2ML IJ SOLN
INTRAMUSCULAR | Status: AC
  Filled 2015-11-19: qty 2

## 2015-11-19 MED ORDER — ACETAMINOPHEN 160 MG/5ML PO SOLN: 650.0000 mg | ORAL | Status: DC | PRN

## 2015-11-19 MED ORDER — SCOPOLAMINE 1 MG/3DAYS TD PT72
MEDICATED_PATCH | TRANSDERMAL | Status: DC | PRN
  Administered 2015-11-19: 1 via TRANSDERMAL

## 2015-11-19 MED ORDER — SODIUM CHLORIDE 0.9 % IV SOLN
INTRAVENOUS | Status: DC
  Administered 2015-11-19: 16:00:00 via INTRAVENOUS
  Administered 2015-11-20: 1000 mL via INTRAVENOUS

## 2015-11-19 MED ORDER — PREMIER PROTEIN SHAKE: 2.0000 [oz_av] | ORAL | Status: DC

## 2015-11-19 MED ORDER — CEFOTETAN DISODIUM-DEXTROSE 2-2.08 GM-% IV SOLR
INTRAVENOUS | Status: AC
  Filled 2015-11-19: qty 50

## 2015-11-19 MED ORDER — LACTATED RINGERS IV SOLN: INTRAVENOUS | Status: DC

## 2015-11-19 MED ORDER — OXYCODONE HCL 5 MG/5ML PO SOLN
5.0000 mg | ORAL | Status: DC | PRN
  Administered 2015-11-20 (×2): 5 mg via ORAL
  Filled 2015-11-19 (×2): qty 5

## 2015-11-19 MED ORDER — ENOXAPARIN SODIUM 30 MG/0.3ML ~~LOC~~ SOLN
30.0000 mg | Freq: Two times a day (BID) | SUBCUTANEOUS | Status: DC
  Administered 2015-11-20: 30 mg via SUBCUTANEOUS
  Filled 2015-11-19: qty 0.3

## 2015-11-19 MED ORDER — SODIUM CHLORIDE 0.9 % IJ SOLN
INTRAMUSCULAR | Status: AC
  Filled 2015-11-19: qty 50

## 2015-11-19 MED ORDER — DEXAMETHASONE SODIUM PHOSPHATE 10 MG/ML IJ SOLN
INTRAMUSCULAR | Status: AC
  Filled 2015-11-19: qty 1

## 2015-11-19 MED ORDER — MIDAZOLAM HCL 2 MG/2ML IJ SOLN
INTRAMUSCULAR | Status: AC
  Filled 2015-11-19: qty 2

## 2015-11-19 MED ORDER — PHENYLEPHRINE 40 MCG/ML (10ML) SYRINGE FOR IV PUSH (FOR BLOOD PRESSURE SUPPORT)
PREFILLED_SYRINGE | INTRAVENOUS | Status: AC
  Filled 2015-11-19: qty 20

## 2015-11-19 SURGICAL SUPPLY — 58 items
APPLIER CLIP 5 13 M/L LIGAMAX5 (MISCELLANEOUS)
APPLIER CLIP ROT 10 11.4 M/L (STAPLE)
APPLIER CLIP ROT 13.4 12 LRG (CLIP)
BLADE SURG SZ11 CARB STEEL (BLADE) ×2 IMPLANT
CABLE HIGH FREQUENCY MONO STRZ (ELECTRODE) ×2 IMPLANT
CHLORAPREP W/TINT 26ML (MISCELLANEOUS) ×2 IMPLANT
CLIP APPLIE 5 13 M/L LIGAMAX5 (MISCELLANEOUS) IMPLANT
CLIP APPLIE ROT 10 11.4 M/L (STAPLE) IMPLANT
CLIP APPLIE ROT 13.4 12 LRG (CLIP) IMPLANT
COVER SURGICAL LIGHT HANDLE (MISCELLANEOUS) IMPLANT
DRAIN CHANNEL 19F RND (DRAIN) IMPLANT
ELECT REM PT RETURN 9FT ADLT (ELECTROSURGICAL) ×2
ELECTRODE REM PT RTRN 9FT ADLT (ELECTROSURGICAL) ×1 IMPLANT
EVACUATOR SILICONE 100CC (DRAIN) IMPLANT
GAUZE SPONGE 4X4 12PLY STRL (GAUZE/BANDAGES/DRESSINGS) IMPLANT
GLOVE BIOGEL PI IND STRL 7.0 (GLOVE) ×1 IMPLANT
GLOVE BIOGEL PI INDICATOR 7.0 (GLOVE) ×1
GLOVE SURG SS PI 7.0 STRL IVOR (GLOVE) ×2 IMPLANT
GOWN STRL REUS W/TWL LRG LVL3 (GOWN DISPOSABLE) ×2 IMPLANT
GOWN STRL REUS W/TWL XL LVL3 (GOWN DISPOSABLE) ×6 IMPLANT
GRASPER SUT TROCAR 14GX15 (MISCELLANEOUS) ×2 IMPLANT
HANDLE STAPLE EGIA 4 XL (STAPLE) ×2 IMPLANT
HOVERMATT SINGLE USE (MISCELLANEOUS) ×2 IMPLANT
IRRIG SUCT STRYKERFLOW 2 WTIP (MISCELLANEOUS) ×2
IRRIGATION SUCT STRKRFLW 2 WTP (MISCELLANEOUS) ×1 IMPLANT
KIT BASIN OR (CUSTOM PROCEDURE TRAY) ×2 IMPLANT
LIQUID BAND (GAUZE/BANDAGES/DRESSINGS) ×2 IMPLANT
MARKER SKIN DUAL TIP RULER LAB (MISCELLANEOUS) ×2 IMPLANT
NEEDLE SPNL 22GX3.5 QUINCKE BK (NEEDLE) ×2 IMPLANT
PACK CARDIOVASCULAR III (CUSTOM PROCEDURE TRAY) ×2 IMPLANT
POUCH SPECIMEN RETRIEVAL 10MM (ENDOMECHANICALS) ×2 IMPLANT
RELOAD EGIA 45 MED/THCK PURPLE (STAPLE) ×2 IMPLANT
RELOAD STAPLER 60MM BLK (STAPLE) IMPLANT
RELOAD TRI 45 ART MED THCK BLK (STAPLE) IMPLANT
RELOAD TRI 45 ART MED THCK PUR (STAPLE) IMPLANT
RELOAD TRI 60 ART MED THCK BLK (STAPLE) ×4 IMPLANT
RELOAD TRI 60 ART MED THCK PUR (STAPLE) ×6 IMPLANT
SCISSORS LAP 5X45 EPIX DISP (ENDOMECHANICALS) ×2 IMPLANT
SHEARS HARMONIC ACE PLUS 45CM (MISCELLANEOUS) ×2 IMPLANT
SLEEVE GASTRECTOMY 40FR VISIGI (MISCELLANEOUS) ×2 IMPLANT
SLEEVE XCEL OPT CAN 5 100 (ENDOMECHANICALS) ×6 IMPLANT
SOLUTION ANTI FOG 6CC (MISCELLANEOUS) ×2 IMPLANT
SPONGE LAP 18X18 X RAY DECT (DISPOSABLE) ×2 IMPLANT
STAPLER RELOAD 60MM BLK (STAPLE)
SUT ETHIBOND 0 36 GRN (SUTURE) IMPLANT
SUT ETHILON 2 0 PS N (SUTURE) IMPLANT
SUT MNCRL AB 4-0 PS2 18 (SUTURE) ×2 IMPLANT
SUT SILK 0 SH 30 (SUTURE) IMPLANT
SUT VICRYL 0 TIES 12 18 (SUTURE) ×2 IMPLANT
SYR 20CC LL (SYRINGE) ×2 IMPLANT
SYR 50ML LL SCALE MARK (SYRINGE) ×2 IMPLANT
TOWEL OR 17X26 10 PK STRL BLUE (TOWEL DISPOSABLE) ×2 IMPLANT
TOWEL OR NON WOVEN STRL DISP B (DISPOSABLE) ×2 IMPLANT
TROCAR BLADELESS 15MM (ENDOMECHANICALS) ×2 IMPLANT
TROCAR BLADELESS OPT 5 100 (ENDOMECHANICALS) ×2 IMPLANT
TUBING CONNECTING 10 (TUBING) ×2 IMPLANT
TUBING ENDO SMARTCAP (MISCELLANEOUS) ×2 IMPLANT
TUBING INSUF HEATED (TUBING) ×2 IMPLANT

## 2015-11-19 NOTE — Op Note (Signed)
Carlei Adderley [id number] Aug 05, 1980 11/19/2015  Preoperative diagnosis: undergoing sleeve gastrectomy  Postoperative diagnosis: Same   Procedure: Upper endoscopy   Surgeon: Catalina Antigua B. Hassell Done  M.D., FACS   Anesthesia: Gen.   Indications for procedure: This patient was undergoing a sleeve gastrectomy by Dr. Kieth Brightly.    Description of procedure: The endoscopy was placed in the mouth and into the oropharynx and under endoscopic vision it was advanced to the esophagogastric junction.  The sleeve was insufflated and was found to be symmetric.  The scope was passed easily down into the antrum to visualize the pylorus.   No bleeding or leaks were detected.  The scope was withdrawn without difficulty.     Matt B. Hassell Done, MD, FACS General, Bariatric, & Minimally Invasive Surgery [facility], [place]

## 2015-11-19 NOTE — Anesthesia Postprocedure Evaluation (Signed)
Anesthesia Post Note  Patient: Ileene Fleszar  Procedure(s) Performed: Procedure(s) (LRB): LAPAROSCOPIC GASTRIC SLEEVE RESECTION, UPPER ENDO (N/A)  Patient location during evaluation: PACU Anesthesia Type: General Level of consciousness: awake and alert Pain management: pain level controlled Vital Signs Assessment: post-procedure vital signs reviewed and stable Respiratory status: spontaneous breathing, nonlabored ventilation, respiratory function stable and patient connected to nasal cannula oxygen Cardiovascular status: blood pressure returned to baseline and stable Postop Assessment: no signs of nausea or vomiting Anesthetic complications: no    Last Vitals:  Vitals:   11/19/15 1254 11/19/15 1300  BP:  140/81  Pulse:  (!) 107  Resp:  17  Temp: 36.7 C     Last Pain:  Vitals:   11/19/15 1215  TempSrc:   PainSc: Asleep                 Effie Berkshire

## 2015-11-19 NOTE — Transfer of Care (Signed)
Immediate Anesthesia Transfer of Care Note  Patient: Alison Fitzgerald  Procedure(s) Performed: Procedure(s): LAPAROSCOPIC GASTRIC SLEEVE RESECTION, UPPER ENDO (N/A)  Patient Location: PACU  Anesthesia Type:General  Level of Consciousness:  sedated, patient cooperative and responds to stimulation  Airway & Oxygen Therapy:Patient Spontanous Breathing and Patient connected to face mask oxgen  Post-op Assessment:  Report given to PACU RN and Post -op Vital signs reviewed and stable  Post vital signs:  Reviewed and stable  Last Vitals:  Vitals:   11/19/15 0740 11/19/15 1126  BP: 121/78 (!) 156/96  Pulse: 100 (!) (P) 105  Resp: 18 (P) 14  Temp: 36.7 C (P) [id number] C    Complications: No apparent anesthesia complications

## 2015-11-19 NOTE — Discharge Instructions (Addendum)
Aprepitant Discharge Instructions  °On the day of surgery you were given the medication aprepitant. This medication interacts with hormonal forms of birth control (oral contraceptives and injected or implanted birth control) and may make them ineffective. °IF YOU USE ANY HORMONAL FORM OF BIRTH CONTROL, YOU MUST USE AN ADDITIONAL BARRIER BIRTH CONTROL METHOD FOR ONE MONTH after receiving aprepitant or there is a chance you could become pregnant. ° ° ° ° °GASTRIC BYPASS/SLEEVE ° Home Care Instructions ° ° These instructions are to help you care for yourself when you go home. ° °Call: If you have any problems. °• Call [phone] and ask for the surgeon on call °• If you need immediate assistance come to the ER at [facility]. Tell the ER staff you are a new post-op gastric bypass or gastric sleeve patient  °Signs and symptoms to report: • Severe  vomiting or nausea °o If you cannot handle clear liquids for longer than 1 day, call your surgeon °• Abdominal pain which does not get better after taking your pain medication °• Fever greater than 100.4°  F and chills °• Heart rate over 100 beats a minute °• Trouble breathing °• Chest pain °• Redness,  swelling, drainage, or foul odor at incision (surgical) sites °• If your incisions open or pull apart °• Swelling or pain in calf (lower leg) °• Diarrhea (Loose bowel movements that happen often), frequent watery, uncontrolled bowel movements °• Constipation, (no bowel movements for 3 days) if this happens: °o Take Milk of Magnesia, 2 tablespoons by mouth, 3 times a day for 2 days if needed °o Stop taking Milk of Magnesia once you have had a bowel movement °o Call your doctor if constipation continues °Or °o Take Miralax  (instead of Milk of Magnesia) following the label instructions °o Stop taking Miralax once you have had a bowel movement °o Call your doctor if constipation continues °• Anything you think is “abnormal for you” °  °Normal side effects after surgery: • Unable  to sleep at night or unable to concentrate °• Irritability °• Being tearful (crying) or depressed ° °These are common complaints, possibly related to your anesthesia, stress of surgery, and change in lifestyle, that usually go away a few weeks after surgery. If these feelings continue, call your medical doctor.  °Wound Care: You may have surgical glue, steri-strips, or staples over your incisions after surgery °• Surgical glue: Looks like clear film over your incisions and will wear off a little at a time °• Steri-strips: Adhesive strips of tape over your incisions. You may notice a yellowish color on skin under the steri-strips. This is used to make the steri-strips stick better. Do not pull the steri-strips off - let them fall off °• Staples: Staples may be removed before you leave the hospital °o If you go home with staples, call [facility] for an appointment with your surgeon’s nurse to have staples removed 10 days after surgery, [phone] °• Showering: You may shower two (2) days after your surgery unless your surgeon tells you differently °o Wash gently around incisions with warm soapy water, rinse well, and gently pat dry °o If you have a drain (tube from your incision), you may need someone to hold this while you shower °o No tub baths until staples are removed and incisions are healed °  °Medications: • Medications should be liquid or crushed if larger than the size of a dime °• Extended release pills (medication that releases a little bit at   a time through the  day) should not be crushed °• Depending on the size and number of medications you take, you may need to space (take a few throughout the day)/change the time you take your medications so that you do not over-fill your pouch (smaller stomach) °• Make sure you follow-up with you primary care physician to make medication changes needed during rapid weight loss and life -style changes °• If you have diabetes, follow up with your  doctor that orders your diabetes medication(s) within one week after surgery and check your blood sugar regularly ° °• Do not drive while taking narcotics (pain medications) ° °• Do not take acetaminophen (Tylenol) and Roxicet or Lortab Elixir at the same time since these pain medications contain acetaminophen °  °Diet:  °First 2 Weeks You will see the nutritionist about two (2) weeks after your surgery. The nutritionist will increase the types of foods you can eat if you are handling liquids well: °• If you have severe vomiting or nausea and cannot handle clear liquids lasting longer than 1 day call your surgeon °Protein Shake °• Drink at least 2 ounces of shake 5-6 times per day °• Each serving of protein shakes (usually 8-12 ounces) should have a minimum of: °o 15 grams of protein °o And no more than 5 grams of carbohydrate °• Goal for protein each day: °o Men = 80 grams per day °o Women = 60 grams per day °  ° • Protein powder may be added to fluids such as non-fat milk or Lactaid milk or Soy milk (limit to 35 grams added protein powder per serving) ° °Hydration °• Slowly increase the amount of water and other clear liquids as tolerated (See Acceptable Fluids) °• Slowly increase the amount of protein shake as tolerated °• Sip fluids slowly and throughout the day °• May use sugar substitutes in small amounts (no more than 6-8 packets per day; i.e. Splenda) ° °Fluid Goal °• The first goal is to drink at least 8 ounces of protein shake/drink per day (or as directed by the nutritionist); some examples of protein shakes are Syntrax Nectar, Adkins Advantage, EAS Edge HP, and Unjury. - See handout from pre-op Bariatric Education Class: °o Slowly increase the amount of protein shake you drink as tolerated °o You may find it easier to slowly sip shakes throughout the day °o It is important to get your proteins in first °• Your fluid goal is to drink 64-100 ounces of fluid daily °o It may take a few weeks to build up to  this  °• 32 oz. (or more) should be clear liquids °And °• 32 oz. (or more) should be full liquids (see below for examples) °• Liquids should not contain sugar, caffeine, or carbonation ° °Clear Liquids: °• Water of Sugar-free flavored water (i.e. Fruit H²O, Propel) °• Decaffeinated coffee or tea (sugar-free) °• Crystal lite, Wyler’s Lite, Minute Maid Lite °• Sugar-free Jell-O °• Bouillon or broth °• Sugar-free Popsicle:    - Less than 20 calories each; Limit 1 per day ° °Full Liquids: °                  Protein Shakes/Drinks + 2 choices per day of other full liquids °• Full liquids must be: °o No More Than 12 grams of Carbs per serving °o No More Than 3 grams of Fat per serving °• Strained low-fat cream soup °• Non-Fat milk °• Fat-free Lactaid Milk °• Sugar-free yogurt (Dannon Lite & Fit, Greek   yogurt) ° °  °Vitamins and Minerals • Start 1 day after surgery unless otherwise directed by your surgeon °• 2 Chewable Multivitamin / Multimineral Supplement with iron (i.e. Centrum for Adults) °• Vitamin B-12, 350-500 micrograms sub-lingual (place tablet under the tongue) each day °• Chewable Calcium Citrate with Vitamin D-3 °(Example: 3 Chewable Calcium  Plus 600 with Vitamin D-3) °o Take 500 mg three (3) times a day for a total of 1500 mg each day °o Do not take all 3 doses of calcium at one time as it may cause constipation, and you can only absorb 500 mg at a time °o Do not mix multivitamins containing iron with calcium supplements;  take 2 hours apart °o Do not substitute Tums (calcium carbonate) for your calcium °• Menstruating women and those at risk for anemia ( a blood disease that causes weakness) may need extra iron °o Talk to your doctor to see if you need more iron °• If you need extra iron: Total daily Iron recommendation (including Vitamins) is 50 to 100 mg Iron/day °• Do not stop taking or change any vitamins or minerals until you talk to your nutritionist or surgeon °• Your nutritionist and/or surgeon must  approve all vitamin and mineral supplements °  °Activity and Exercise: It is important to continue walking at home. Limit your physical activity as instructed by your doctor. During this time, use these guidelines: °• Do not lift anything greater than ten  (10) pounds for at least two (2) weeks °• Do not go back to work or drive until your surgeon says you can °• You may have sex when you feel comfortable °o It is VERY important for female patients to use a reliable birth control method; fertility often increase after surgery °o Do not get pregnant for at least 18 months °• Start exercising as soon as your doctor tells you that you can °o Make sure your doctor approves any physical activity °• Start with a simple walking program °• Walk 5-15 minutes each day, 7 days per week °• Slowly increase until you are walking 30-45 minutes per day °• Consider joining our BELT program. [phone] or email [email or web address] °  °Special Instructions Things to remember: °• Free counseling is available for you and your family through collaboration between [facility] and [employer]. Please call [phone] and leave a message °• Use your CPAP when sleeping if this applies to you °• Consider buying a medical alert bracelet that says you had lap-band surgery °  °  You will likely have your first fill (fluid added to your band) 6 - 8 weeks after surgery °• [facility] has a free Bariatric Surgery Support Group that meets monthly, the 3rd Thursday, 6pm. [facility] Class[facility]. You can see classes online at [email or web address] °• It is very important to keep all follow up appointments with your surgeon, nutritionist, primary care physician, and behavioral health practitioner °o After the first year, please follow up with your bariatric surgeon and nutritionist at least once a year in order to maintain best weight loss results °      °             [facility]:  [phone] ° °             [facility]: [phone] ° °             Bariatric Nurse Coordinator: [phone]  °Gastric Bypass/Sleeve Home Care   Instructions  Rev. 05/2012    ° °                                                    Reviewed and Endorsed °                                                   by [facility] Patient Education Committee, Jan, 2014 ° ° ° ° ° ° ° ° ° °

## 2015-11-19 NOTE — Plan of Care (Signed)
Problem: Activity: Goal: Ability to tolerate increased activity will improve Outcome: Progressing Patient walked to bathroom and back to bed once this shift.  Problem: Bowel/Gastric: Goal: Occurrences of nausea will decrease Outcome: Progressing Phenergan helped with nausea   Problem: Physical Regulation: Goal: Will show no signs or symptoms of venous thromboembolism Outcome: Progressing SCD's on

## 2015-11-19 NOTE — Op Note (Signed)
Preop Diagnosis: Obesity Class III  Postop Diagnosis: same  Procedure performed: laparoscopic Sleeve Gastrectomy  Assitant: Kaylyn Lim  Indications:  The patient is a 35 y.o. year-old morbidly obese female who has been followed in the Bariatric Clinic as an outpatient. This patient was diagnosed with morbid obesity with a BMI of Body mass index is 42.82 kg/m. and significant co-morbidities including hypertension and insulin dependent diabetes.  The patient was counseled extensively in the Bariatric Outpatient Clinic and after a thorough explanation of the risks and benefits of surgery (including death from complications, bowel leak, infection such as peritonitis and/or sepsis, internal hernia, bleeding, need for blood transfusion, bowel obstruction, organ failure, pulmonary embolus, deep venous thrombosis, wound infection, incisional hernia, skin breakdown, and others entailed on the consent form) and after a compliant diet and exercise program, the patient was scheduled for an elective laparoscopic sleeve gastrectomy.  Description of Operation:  Following informed consent, the patient was taken to the operating room and placed on the operating table in the supine position.  She had previously received prophylactic antibiotics and subcutaneous heparin for DVT prophylaxis in the pre-op holding area.  After induction of general endotracheal anesthesia by the anesthesiologist, the patient underwent placement of sequential compression devices, Foley catheter and an oro-gastric tube.  A timeout was confirmed by the surgery and anesthesia teams.  The patient was adequately padded at all pressure points and placed on a footboard to prevent slippage from the OR table during extremes of position during surgery.  She underwent a routine sterile prep and drape of her entire abdomen.    Next, A transverse incision was made under the left subcostal area and a 78mm optical viewing trocar was introduced into the  peritoneal cavity. Pneumoperitoneum was applied with a high flow and low pressure. A laparoscope was inserted to confirm placement. A extraperitoneal block was then placed at the lateral abdominal wall using exparel . 4 additional trocars were placed: 1 38mm trocar to the left of the midline. 1 31mm trocar in the right mid abdomen, and 1 40mm trocar in the right subcostal area.  The fat pad at the GE junction was incised and the gastrodiaphragmatic ligament was divided using the Harmonic scalpel. Next, a hole was created through the lesser omentum along the greater curve of the stomach to enter the lesser sac. The vessels along the greater omentum were  Then ligated and divided using the Harmonic scalpel moving towards the spleen and then short gastric vessels were ligated and divided in the same fashion to fully mobilize the fundus. The left crus was identified to ensure completion of the dissection. Next the antrum was measured and dissection continued inferiorly along the greater curve towards the pylorus and stopped 6cm from the pylorus. Towards the end of the dissection, a vessel was partially ligated and bled approximately 14ml of pulsatile blood. The vessel was controlled and ligated at the site as well as proximal and no further bleeding was encountered.   A 40Fr ViSiGi dilator was placed into the esophgaus and along the lesser curve of the stomach and placed on suction. A 62mm 4-24mm tristapler was used to begin the resection along the antrum being sure to stay well away from the angularis by angling the jaws of the stapler towards the greater curve. An additional 60mm 4-66mm tristapler was used to continue the dissection. Then multiple 29mm 3-75mm tristapler loads were used to complete the resection staying along the Economy and ensuring the fundus was not retained by  appropriately retracting it lateral. Air was inserted through the [place] to perform a leak test showing no bubbles and a neutral lie of the  stomach.  The assistant then went and performed an upper endoscopy and leak test. No bubbles were seen and the sleeve and antrum distended appropriately. The specimen was then placed in an endocatch bag and removed by the 29mm port. The fascia of the 85mm port was closed with a 0 vicryl by suture passer. Hemostasis was ensured. Pneumoperitoneum was evacuated, all ports were removed and all incisions closed with 4-0 monocryl suture in subcuticular fashion. Glue was put in place for dressing. The patient awoke from anesthesia and was brought to pacu in stable condition. All counts were correct.  Specimens:  Sleeve gastrectomy  Post-Op Plan:       Pain Management: PO, prn      Antibiotics: Prophylactic      Anticoagulation: Prophylactic, Starting now      Post Op Studies/Consults: Not applicable      Intended Discharge: within 48h      Intended Outpatient Follow-Up: Two Week      Intended Outpatient Studies: Not Applicable      Other: Not Applicable   Arta Bruce Kinsinger

## 2015-11-19 NOTE — Anesthesia Preprocedure Evaluation (Addendum)
Anesthesia Evaluation  Patient identified by MRN, date of birth, ID band Patient awake    Reviewed: Allergy & Precautions, NPO status , Patient's Chart, lab work & pertinent test results, reviewed documented beta blocker date and time   History of Anesthesia Complications (+) PONV and history of anesthetic complications  Airway Mallampati: I  TM Distance: >3 FB Neck ROM: Full    Dental  (+) Teeth Intact, Dental Advisory Given   Pulmonary sleep apnea and Continuous Positive Airway Pressure Ventilation ,    breath sounds clear to auscultation       Cardiovascular negative cardio ROS   Rhythm:Regular Rate:Normal     Neuro/Psych negative neurological ROS  negative psych ROS   GI/Hepatic negative GI ROS, Neg liver ROS,   Endo/Other  diabetes, Type 2Hypothyroidism   Renal/GU   negative genitourinary   Musculoskeletal negative musculoskeletal ROS (+)   Abdominal   Peds negative pediatric ROS (+)  Hematology negative hematology ROS (+)   Anesthesia Other Findings   Reproductive/Obstetrics negative OB ROS                            Lab Results  Component Value Date   WBC 5.9 11/15/2015   HGB 13.7 11/15/2015   HCT 39.4 11/15/2015   MCV 91.2 11/15/2015   PLT 282 11/15/2015   Lab Results  Component Value Date   CREATININE 0.92 11/15/2015   BUN 21 (H) 11/15/2015   NA 136 11/15/2015   K 4.0 11/15/2015   CL 102 11/15/2015   CO2 24 11/15/2015   No results found for: INR, PROTIME  05/2015 EKG: normal sinus rhythm.   Anesthesia Physical Anesthesia Plan  ASA: III  Anesthesia Plan: General   Post-op Pain Management:    Induction: Intravenous  Airway Management Planned: Oral ETT  Additional Equipment:   Intra-op Plan:   Post-operative Plan: Extubation in OR  Informed Consent: I have reviewed the patients History and Physical, chart, labs and discussed the procedure including  the risks, benefits and alternatives for the proposed anesthesia with the patient or authorized representative who has indicated his/her understanding and acceptance.   Dental advisory given  Plan Discussed with: CRNA  Anesthesia Plan Comments:         Anesthesia Quick Evaluation

## 2015-11-19 NOTE — Anesthesia Procedure Notes (Signed)
Procedure Name: Intubation Date/Time: 11/19/2015 9:46 AM Performed by: Montel Clock Pre-anesthesia Checklist: Patient identified, Emergency Drugs available, Suction available, Patient being monitored and Timeout performed Patient Re-evaluated:Patient Re-evaluated prior to inductionOxygen Delivery Method: Circle system utilized Preoxygenation: Pre-oxygenation with 100% oxygen Intubation Type: IV induction Ventilation: Mask ventilation without difficulty, Oral airway inserted - appropriate to patient size and Two handed mask ventilation required Laryngoscope Size: Mac and 3 Grade View: Grade II Tube type: Oral Tube size: 7.5 mm Number of attempts: 2 Airway Equipment and Method: Stylet Placement Confirmation: ETT inserted through vocal cords under direct vision,  positive ETCO2 and breath sounds checked- equal and bilateral Secured at: 22 cm Tube secured with: Tape Dental Injury: Teeth and Oropharynx as per pre-operative assessment  Comments: Mask by paramedic student, 2-handed with OA #9 easy ventilation. Attempt x1 by paramedic student no view. Attempt by MDA x1 grade 2a view, ETT easily passed.

## 2015-11-19 NOTE — Progress Notes (Signed)
Nasal trumpet removed upon arrival to pacu

## 2015-11-19 NOTE — H&P (Signed)
Alison Fitzgerald is an 35 y.o. female.   Chief Complaint: obesity HPI: 35 yo female who presents for sleeve gastrectomy for obesity. She has completed all preoperative requirements. She has no hiatal hernia on UGI. She has multiple comorbidities  Past Medical History:  Diagnosis Date  . Diabetes mellitus without complication ([place])   . Hypothyroidism   . Injury of left great toe    due to trauma- area required stitches and stitches are out- DR Ivaan Liddy aware per pateint   . Kidney stones   . PONV (postoperative nausea and vomiting)   . Sleep apnea    cpap- setting at 8 per patient   . Staghorn kidney stones    right nephrectomy     Past Surgical History:  Procedure Laterality Date  . CESAREAN SECTION    . cystocopy      multiple times   . EXCISION OF ENDOMETRIOMA     removed 2009   . NEPHRECTOMY    . PERCUTANEOUS NEPHROSTOMY     multiple times   . URETEROSCOPY     multiple times     Family History  Problem Relation Age of Onset  . Ovarian cancer Paternal Grandmother   . Diabetes Maternal Grandmother   . Diabetes Mother    Social History:  reports that she has never smoked. She has never used smokeless tobacco. She reports that she does not drink alcohol or use drugs.  Allergies:  Allergies  Allergen Reactions  . Doxycycline Anaphylaxis  . Toradol [Ketorolac Tromethamine] Hives  . Food Swelling    Jalapeno seeds  . Ibuprofen Other (See Comments)    Do not take due to bariatric surgery  . Indomethacin     Kidney damage  . Nsaids Other (See Comments)    Do not take Due to bariatric surgery...  . Other     thyola- bottomed out blood pressure  . Prednisone     Facial swelling    Medications Prior to Admission  Medication Sig Dispense Refill  . allopurinol (ZYLOPRIM) 100 MG tablet Take 100 mg by mouth at bedtime.   0  . BAYER MICROLET LANCETS lancets Use to check blood sugar 1 time per day. 100 each 2  . Blood Glucose Monitoring Suppl (BAYER CONTOUR  NEXT MONITOR) w/Device KIT Use to check blood sugar 1 time per day. 1 kit 2  . Calcium Citrate-Vitamin D 500-500 MG-UNIT CHEW Chew 1 capsule by mouth 3 (three) times daily.    . captopril (CAPOTEN) 25 MG tablet Take 25 mg by mouth daily.   11  . citric acid-potassium citrate (POLYCITRA) 1100-334 MG/5ML solution Take 30 mLs by mouth 3 (three) times daily.    . Cyanocobalamin (VITAMIN B-12) 1000 MCG SUBL Place 1,000 mcg under the tongue daily.    Marland Kitchen glucose blood (BAYER CONTOUR NEXT TEST) test strip Use to check blood sugar 1 time per day. 100 each 2  . HYDROcodone-acetaminophen (NORCO) 5-325 MG tablet Take 1 tablet by mouth every 6 (six) hours as needed. (Patient taking differently: Take 1 tablet by mouth every 6 (six) hours as needed for moderate pain. ) 10 tablet 0  . Insulin Glargine (LANTUS SOLOSTAR) 100 UNIT/ML Solostar Pen Inject 20 Units into the skin daily at 10 pm. (Patient taking differently: Inject 10 Units into the skin at bedtime. ) 15 mL 1  . Insulin Pen Needle 32G X 4 MM MISC Use to inject insulin 4 times daily as instructed. 130 each 5  . insulin regular (NOVOLIN  R) 100 units/mL injection Inject 0.1 mLs (10 Units total) into the skin 3 (three) times daily before meals. 10 mL 4  . Insulin Syringe-Needle U-100 31G X 5/16" 0.3 ML MISC 1 Device by Does not apply route 3 (three) times daily. 100 each 3  . levothyroxine (SYNTHROID, LEVOTHROID) 112 MCG tablet 2 tabs daily (Patient taking differently: Take 224 mcg by mouth daily before breakfast. 2 tabs daily) 100 tablet 5  . Multiple Vitamins-Minerals (MULTIVITAMIN WITH MINERALS) tablet Take 2 tablets by mouth daily.    . colchicine 0.6 MG tablet Take 1 tablet (0.6 mg total) by mouth 2 (two) times daily. (Patient not taking: Reported on 09/29/2015) 30 tablet 0  . metoprolol tartrate (LOPRESSOR) 25 MG tablet Take 1 tablet (25 mg total) by mouth 2 (two) times daily. (Patient not taking: Reported on 11/15/2015) 180 tablet 3  . naproxen (NAPROSYN)  500 MG tablet Take 1 tablet (500 mg total) by mouth 2 (two) times daily. (Patient not taking: Reported on 11/15/2015) 30 tablet 0  . nystatin-triamcinolone (MYCOLOG II) cream Apply to rash on abdomen daily for 10 days. (Patient not taking: Reported on 09/29/2015) 15 g 0    Results for orders placed or performed during the hospital encounter of 11/19/15 (from the past 48 hour(s))  Pregnancy, urine STAT morning of surgery     Status: None   Collection Time: 11/19/15  7:22 AM  Result Value Ref Range   Preg Test, Ur NEGATIVE NEGATIVE    Comment:        THE SENSITIVITY OF THIS METHODOLOGY IS >20 mIU/mL.    No results found.  Review of Systems  Constitutional: Negative for chills and fever.  HENT: Negative for hearing loss.   Eyes: Negative for blurred vision and double vision.  Respiratory: Negative for cough and hemoptysis.   Cardiovascular: Negative for chest pain and palpitations.  Gastrointestinal: Negative for abdominal pain, nausea and vomiting.  Genitourinary: Negative for dysuria and urgency.  Musculoskeletal: Negative for myalgias and neck pain.  Skin: Negative for itching and rash.  Neurological: Negative for dizziness, tingling and headaches.  Endo/Heme/Allergies: Does not bruise/bleed easily.  Psychiatric/Behavioral: Negative for depression and suicidal ideas.    Blood pressure 121/78, pulse 100, temperature 98.1 F (36.7 C), temperature source Oral, resp. rate 18, height 5' 4.5" (1.638 m), weight 114.9 kg (253 lb 6.4 oz), last menstrual period 11/01/2015, SpO2 99 %. Physical Exam  Nursing note and vitals reviewed. Constitutional: She is oriented to person, place, and time. She appears well-developed and well-nourished.  HENT:  Head: Normocephalic and atraumatic.  Eyes: Conjunctivae and EOM are normal. No scleral icterus.  Neck: Normal range of motion. Neck supple.  Cardiovascular: Normal rate and regular rhythm.   Respiratory: Effort normal and breath sounds normal. She  has no wheezes. She has no rales. She exhibits no tenderness.  GI: Soft. She exhibits no distension. There is no tenderness. There is no rebound.  Musculoskeletal: Normal range of motion. She exhibits no edema.  Neurological: She is alert and oriented to person, place, and time.  Skin: Skin is warm and dry.  Right toe with bandage  Psychiatric: She has a normal mood and affect. Her behavior is normal.     Assessment/Plan 35 yo female with morbi obesity and multiple comorbidities -sleeve gastrectomy -CPAP postop -SSI -early ambulation, chemical vte proph -pain control -water trial diet postop -all questions were answered prior to procedure and details of the case were again reviewed.  Mickeal Skinner, MD  11/19/2015, 8:57 AM

## 2015-11-20 LAB — COMPREHENSIVE METABOLIC PANEL
ALBUMIN: 3.2 g/dL — AB (ref 3.5–5.0)
ALT: 26 U/L (ref 14–54)
ANION GAP: 8 (ref 5–15)
AST: 26 U/L (ref 15–41)
Alkaline Phosphatase: 57 U/L (ref 38–126)
BUN: 15 mg/dL (ref 6–20)
CHLORIDE: 105 mmol/L (ref 101–111)
CO2: 24 mmol/L (ref 22–32)
Calcium: 8.4 mg/dL — ABNORMAL LOW (ref 8.9–10.3)
Creatinine, Ser: 0.95 mg/dL (ref 0.44–1.00)
GFR calc non Af Amer: >60 mL/min (ref >60)
Glucose, Bld: 134 mg/dL — ABNORMAL HIGH (ref 65–99)
POTASSIUM: 4.1 mmol/L (ref 3.5–5.1)
SODIUM: 137 mmol/L (ref 135–145)
Total Bilirubin: 0.7 mg/dL (ref 0.3–1.2)
Total Protein: 6.3 g/dL — ABNORMAL LOW (ref 6.5–8.1)

## 2015-11-20 LAB — CBC WITH DIFFERENTIAL/PLATELET
BASOS PCT: 0 %
Basophils Absolute: 0 10*3/uL (ref 0.0–0.1)
Eosinophils Absolute: 0 10*3/uL (ref 0.0–0.7)
Eosinophils Relative: 0 %
HEMATOCRIT: 35.6 % — AB (ref 36.0–46.0)
HEMOGLOBIN: 12 g/dL (ref 12.0–15.0)
LYMPHS ABS: 1.3 10*3/uL (ref 0.7–4.0)
LYMPHS PCT: 15 %
MCH: 31 pg (ref 26.0–34.0)
MCHC: 33.7 g/dL (ref 30.0–36.0)
MCV: 92 fL (ref 78.0–100.0)
MONOS PCT: 7 %
Monocytes Absolute: 0.6 10*3/uL (ref 0.1–1.0)
NEUTROS ABS: 6.8 10*3/uL (ref 1.7–7.7)
NEUTROS PCT: 78 %
PLATELETS: 265 10*3/uL (ref 150–400)
RBC: 3.87 MIL/uL (ref 3.87–5.11)
RDW: 13.6 % (ref 11.5–15.5)
WBC: 8.7 10*3/uL (ref 4.0–10.5)

## 2015-11-20 LAB — GLUCOSE, CAPILLARY
GLUCOSE-CAPILLARY: 106 mg/dL — AB (ref 65–99)
GLUCOSE-CAPILLARY: 164 mg/dL — AB (ref 65–99)
Glucose-Capillary: 126 mg/dL — ABNORMAL HIGH (ref 65–99)
Glucose-Capillary: 147 mg/dL — ABNORMAL HIGH (ref 65–99)

## 2015-11-20 MED ORDER — INSULIN REGULAR HUMAN 100 UNIT/ML IJ SOLN: 5.0000 [IU] | Freq: Three times a day (TID) | INTRAMUSCULAR | 0 refills | Status: DC

## 2015-11-20 MED ORDER — INSULIN GLARGINE 100 UNIT/ML SOLOSTAR PEN
10.0000 [IU] | PEN_INJECTOR | Freq: Every day | SUBCUTANEOUS | 0 refills | Status: DC
Start: 2015-11-20 — End: 2015-12-04

## 2015-11-20 NOTE — Progress Notes (Signed)
Assessment unchanged.  Scripts were given per MD order.  All questions pertaining to D/C we answered.  Pt was D/C'd via wheelchair and accompanied by NT.

## 2015-11-20 NOTE — Plan of Care (Signed)
Problem: Food- and Nutrition-Related Knowledge Deficit (NB-1.1) Goal: Nutrition education Formal process to instruct or train a patient/client in a skill or to impart knowledge to help patients/clients voluntarily manage or modify food choices and eating behavior to maintain or improve health. Outcome: Completed/Met Date Met: 11/20/15 Nutrition Education Note  Received consult for diet education per DROP protocol.   Discussed 2 week post op diet with pt. Emphasized that liquids must be non carbonated, non caffeinated, and sugar free. Fluid goals discussed. Reviewed progression of diet to include soft proteins at 7-10 days post-op. Pt to follow up with outpatient bariatric RD for further diet progression after 2 weeks. Multivitamins and minerals also reviewed. Teach back method used, pt expressed understanding, expect good compliance.   Diet: First 2 Weeks  You will see the dietitian about two (2) weeks after your surgery. The dietitian will increase the types of foods you can eat if you are handling liquids well:  If you have severe vomiting or nausea and cannot handle clear liquids lasting longer than 1 day, call your surgeon  Protein Shake  Drink at least 2 ounces of shake 5-6 times per day  Each serving of protein shakes (usually 8 - 12 ounces) should have a minimum of:  15 grams of protein  And no more than 5 grams of carbohydrate  Goal for protein each day:  Men = 80 grams per day  Women = 60 grams per day  Protein powder may be added to fluids such as non-fat milk or Lactaid milk or Soy milk (limit to 35 grams added protein powder per serving)   Hydration  Slowly increase the amount of water and other clear liquids as tolerated (See Acceptable Fluids)  Slowly increase the amount of protein shake as tolerated  Sip fluids slowly and throughout the day  May use sugar substitutes in small amounts (no more than 6 - 8 packets per day; i.e. Splenda)   Fluid Goal  The first goal is to  drink at least 8 ounces of protein shake/drink per day (or as directed by the nutritionist); some examples of protein shakes are [employer], [employer], EAS Edge HP, and Unjury. See handout from pre-op Bariatric Education Class:  Slowly increase the amount of protein shake you drink as tolerated  You may find it easier to slowly sip shakes throughout the day  It is important to get your proteins in first  Your fluid goal is to drink 64 - 100 ounces of fluid daily  It may take a few weeks to build up to this  32 oz (or more) should be clear liquids  And  32 oz (or more) should be full liquids (see below for examples)  Liquids should not contain sugar, caffeine, or carbonation   Clear Liquids:  Water or Sugar-free flavored water (i.e. Fruit H2O, Propel)  Decaffeinated coffee or tea (sugar-free)  Crystal Lite, Wyler's Lite, Minute Maid Lite  Sugar-free Jell-O  Bouillon or broth  Sugar-free Popsicle: *Less than 20 calories each; Limit 1 per day   Full Liquids:  Protein Shakes/Drinks + 2 choices per day of other full liquids  Full liquids must be:  No More Than 12 grams of Carbs per serving  No More Than 3 grams of Fat per serving  Strained low-fat cream soup  Non-Fat milk  Fat-free Lactaid Milk  Sugar-free yogurt (Dannon Lite & Fit, Greek yogurt)     Clayton Bibles, MS, RD, LDN Pager: [phone] After Hours Pager: [phone]

## 2015-11-20 NOTE — Progress Notes (Signed)
Patient alert and oriented, pain is controlled. Patient is tolerating fluids, advanced to protein shake today, patient is tolerating well.  Reviewed Gastric sleeve discharge instructions with patient and patient is able to articulate understanding.  Provided information on BELT program, Support Group and [facility] outpatient pharmacy. All questions answered, will continue to monitor.  

## 2015-11-25 NOTE — Discharge Summary (Signed)
Physician Discharge Summary  Alison Fitzgerald [id number] DOB: Aug 20, 1980 DOA: 11/19/2015  PCP: Gara Kroner, MD  Admit date: 11/19/2015 Discharge date: 11/25/2015  Recommendations for Outpatient Follow-up:  1.  (include homehealth, outpatient follow-up instructions, specific recommendations for PCP to follow-up on, etc.)  Follow-up Information    Mickeal Skinner, MD .   Specialty:  General Surgery Contact information: [address] [place] [address] [phone]        Luke Aaron Kinsinger, MD .   Specialty:  General Surgery Contact information: [place] [address] [phone]          Discharge Diagnoses:  Active Problems:   Morbid obesity ([place])   Surgical Procedure: Laparoscopic Sleeve Gastrectomy, upper endoscopy  Discharge Condition: Good Disposition: Home  Diet recommendation: Postoperative sleeve gastrectomy diet (liquids only)  Filed Weights   11/19/15 0740  Weight: 114.9 kg (253 lb 6.4 oz)     Hospital Course:  The patient was admitted after undergoing laparoscopic sleeve gastrectomy. POD 0 she ambulated well. POD 1 she was started on the water diet protocol and tolerated 400 ml in the first shift. Once meeting the water amount she was advanced to bariatric protein shakes which they tolerated and were discharged home POD 1.  Treatments: surgery: laparoscopic sleeve gastrectomy  Discharge Instructions  Discharge Instructions    Call MD for:  difficulty breathing, headache or visual disturbances    Complete by:  As directed   Call MD for:  persistant nausea and vomiting    Complete by:  As directed   Call MD for:  redness, tenderness, or signs of infection (pain, swelling, redness, odor or green/yellow discharge around incision site)    Complete by:  As directed   Call MD for:  severe uncontrolled pain    Complete by:  As directed   Call MD for:  temperature >100.4    Complete by:  As  directed   Discharge wound care:    Complete by:  As directed   [place] to shower tomorrow  Glue will likely peel off in 1-3 weeks   Increase activity slowly    Complete by:  As directed   Lifting restrictions    Complete by:  As directed   Do not lift more than 20 pounds for 3-4 weeks       Medication List    STOP taking these medications   HYDROcodone-acetaminophen 5-325 MG tablet Commonly known as:  NORCO   naproxen 500 MG tablet Commonly known as:  NAPROSYN     TAKE these medications   allopurinol 100 MG tablet Commonly known as:  ZYLOPRIM Take 100 mg by mouth at bedtime.   BAYER CONTOUR NEXT MONITOR w/Device Kit Use to check blood sugar 1 time per day.   BAYER MICROLET LANCETS lancets Use to check blood sugar 1 time per day.   Calcium Citrate-Vitamin D 500-500 MG-UNIT Chew Chew 1 capsule by mouth 3 (three) times daily.   captopril 25 MG tablet Commonly known as:  CAPOTEN Take 25 mg by mouth daily. Notes to patient:  Monitor Blood Pressure Daily and keep a log for primary care physician.  You may need to make changes to your medications with rapid weight loss.     citric acid-potassium citrate 1100-334 MG/5ML solution Commonly known as:  POLYCITRA Take 30 mLs by mouth 3 (three) times daily.   colchicine 0.6 MG tablet Take 1 tablet (0.6 mg total) by mouth 2 (two) times  daily.   glucose blood test strip Commonly known as:  BAYER CONTOUR NEXT TEST Use to check blood sugar 1 time per day.   Insulin Glargine 100 UNIT/ML Solostar Pen Commonly known as:  LANTUS SOLOSTAR Inject 10 Units into the skin at bedtime.   Insulin Pen Needle 32G X 4 MM Misc Use to inject insulin 4 times daily as instructed.   insulin regular 100 units/mL injection Commonly known as:  NOVOLIN R Inject 0.05 mLs (5 Units total) into the skin 3 (three) times daily before meals. What changed:  how much to take   Insulin Syringe-Needle U-100 31G X 5/16" 0.3 ML Misc 1 Device by Does not apply  route 3 (three) times daily.   levothyroxine 112 MCG tablet Commonly known as:  SYNTHROID, LEVOTHROID 2 tabs daily What changed:  how much to take  how to take this  when to take this  additional instructions   metoprolol tartrate 25 MG tablet Commonly known as:  LOPRESSOR Take 1 tablet (25 mg total) by mouth 2 (two) times daily. Notes to patient:  Monitor Blood Pressure Daily and keep a log for primary care physician.  You may need to make changes to your medications with rapid weight loss.     Cardiologist has discontinued this medication   multivitamin with minerals tablet Take 2 tablets by mouth daily.   nystatin-triamcinolone cream Commonly known as:  MYCOLOG II Apply to rash on abdomen daily for 10 days.   Vitamin B-12 1000 MCG Subl Place 1,000 mcg under the tongue daily.      Follow-up Information    Mickeal Skinner, MD .   Specialty:  General Surgery Contact information: [place] [address] [phone]        Mickeal Skinner, MD .   Specialty:  General Surgery Contact information: [place] [address] [phone]            The results of significant diagnostics from this hospitalization (including imaging, microbiology, ancillary and laboratory) are listed below for reference.    Significant Diagnostic Studies: No results found.  Labs: Basic Metabolic Panel:  Recent Labs Lab 11/19/15 1240 11/20/15 0457  NA  --  137  K  --  4.1  CL  --  105  CO2  --  24  GLUCOSE  --  134*  BUN  --  15  CREATININE 0.95 0.95  CALCIUM  --  8.4*   Liver Function Tests:  Recent Labs Lab 11/20/15 0457  AST 26  ALT 26  ALKPHOS 57  BILITOT 0.7  PROT 6.3*  ALBUMIN 3.2*    CBC:  Recent Labs Lab 11/19/15 1240 11/20/15 0457  WBC 9.1 8.7  NEUTROABS  --  6.8  HGB 13.0  13.0 12.0  HCT 38.6  37.9 35.6*  MCV 93.0 92.0  PLT 265 265    CBG:  Recent Labs Lab  11/19/15 1943 11/19/15 2350 11/20/15 0342 11/20/15 0828 11/20/15 1158  GLUCAP 204* 164* 126* 106* 147*    Active Problems:   Morbid obesity ([place])   Time coordinating discharge: <71mn

## 2015-11-29 ENCOUNTER — Ambulatory Visit (HOSPITAL_COMMUNITY)
Admission: RE | Admit: 2015-11-29 | Discharge: 2015-11-29 | Disposition: A | Discharge status: Home / Self Care | Payer: BLUE CROSS/BLUE SHIELD | Source: Ambulatory Visit | Attending: Urology | Admitting: Urology

## 2015-11-29 DIAGNOSIS — N200 Calculus of kidney: Secondary | ICD-10-CM | POA: Diagnosis not present

## 2015-11-29 DIAGNOSIS — Z905 Acquired absence of kidney: Secondary | ICD-10-CM | POA: Insufficient documentation

## 2015-12-03 ENCOUNTER — Encounter: Payer: BLUE CROSS/BLUE SHIELD | Attending: General Surgery

## 2015-12-03 DIAGNOSIS — Z6841 Body Mass Index (BMI) 40.0 and over, adult: Secondary | ICD-10-CM | POA: Diagnosis not present

## 2015-12-03 DIAGNOSIS — Z713 Dietary counseling and surveillance: Secondary | ICD-10-CM | POA: Insufficient documentation

## 2015-12-03 NOTE — Progress Notes (Signed)
Bariatric Class:  Appt start time: 1530 end time:  1630.  2 Week Post-Operative Nutrition Class  Patient was seen on 12/03/2015 for Post-Operative Nutrition education at [facility].   Surgery date: 11/19/2015 Surgery type: sleeve gastrectomy [facility] weight at [facility]: 271 lbs on 06/26/2015 Weight today: 234.4 lbs  Weight change: 34.6 lbs  TANITA  BODY COMP RESULTS  10/28/15 12/03/15   BMI (kg/m^2) 46.2 41.5   Fat Mass (lbs) 114.6 110.6   Fat Free Mass (lbs) 154.4 123.8   Total Body Water (lbs) 113.6 90.6   The following the learning objectives were met by the patient during this course:  Identifies Phase 3A (Soft, High Proteins) Dietary Goals and will begin from 2 weeks post-operatively to 2 months post-operatively  Identifies appropriate sources of fluids and proteins   States protein recommendations and appropriate sources post-operatively  Identifies the need for appropriate texture modifications, mastication, and bite sizes when consuming solids  Identifies appropriate multivitamin and calcium sources post-operatively  Describes the need for physical activity post-operatively and will follow MD recommendations  States when to call healthcare provider regarding medication questions or post-operative complications  Handouts given during class include:  Phase 3A: Soft, High Protein Diet Handout  Follow-Up Plan: Patient will follow-up at [facility] in 6 weeks for 2 month post-op nutrition visit for diet advancement per MD.

## 2015-12-04 ENCOUNTER — Ambulatory Visit (INDEPENDENT_AMBULATORY_CARE_PROVIDER_SITE_OTHER): Payer: BLUE CROSS/BLUE SHIELD | Admitting: Urology

## 2015-12-04 ENCOUNTER — Ambulatory Visit (INDEPENDENT_AMBULATORY_CARE_PROVIDER_SITE_OTHER): Payer: BLUE CROSS/BLUE SHIELD | Admitting: Endocrinology

## 2015-12-04 ENCOUNTER — Encounter: Payer: Self-pay | Admitting: Endocrinology

## 2015-12-04 ENCOUNTER — Ambulatory Visit: Payer: Self-pay | Admitting: Endocrinology

## 2015-12-04 VITALS — BP 104/84 | HR 68 | Wt 233.0 lb

## 2015-12-04 DIAGNOSIS — I10 Essential (primary) hypertension: Secondary | ICD-10-CM

## 2015-12-04 DIAGNOSIS — Z23 Encounter for immunization: Secondary | ICD-10-CM

## 2015-12-04 DIAGNOSIS — E669 Obesity, unspecified: Secondary | ICD-10-CM

## 2015-12-04 DIAGNOSIS — N200 Calculus of kidney: Secondary | ICD-10-CM | POA: Diagnosis not present

## 2015-12-04 DIAGNOSIS — E119 Type 2 diabetes mellitus without complications: Secondary | ICD-10-CM

## 2015-12-04 NOTE — Progress Notes (Signed)
Subjective:    Patient ID: Alison Fitzgerald, female    DOB: [birth year], 35 y.o.   MRN: [id number]  HPI Pt returns for f/u of diabetes mellitus: DM type: Insulin-requiring type 2 Dx'ed: 0000000 Complications: none.   Therapy: insulin since dx.   GDM: 2007 DKA: never Severe hypoglycemia: never Pancreatitis: 3 episodes (most recent was 2015; no cause was determined). Other: she takes multiple daily injections; she has had TL.   Interval history: She had sleeve gastrectomy 2 weeks ago.  Since she started preop diet, she has lost 33 lbs.  she is off DM meds now.  no cbg record, but states cbg's are in the low-100's.  No recent steroids.   She stopped capoten, due to hypotension.   Past Medical History:  Diagnosis Date  . Diabetes mellitus without complication ([place])   . Hypothyroidism   . Injury of left great toe    due to trauma- area required stitches and stitches are out- DR Kinsinger aware per pateint   . Kidney stones   . PONV (postoperative nausea and vomiting)   . Sleep apnea    cpap- setting at 8 per patient   . Staghorn kidney stones    right nephrectomy     Past Surgical History:  Procedure Laterality Date  . CESAREAN SECTION    . cystocopy      multiple times   . EXCISION OF ENDOMETRIOMA     removed 2009   . LAPAROSCOPIC GASTRIC SLEEVE RESECTION N/A 11/19/2015   Procedure: LAPAROSCOPIC GASTRIC SLEEVE RESECTION, UPPER ENDO;  Surgeon: Arta Bruce Kinsinger, MD;  Location: [facility];  Service: General;  Laterality: N/A;  . NEPHRECTOMY    . PERCUTANEOUS NEPHROSTOMY     multiple times   . URETEROSCOPY     multiple times     Social History   Social History  . Marital status: Married    Spouse name: N/A  . Number of children: N/A  . Years of education: N/A   Occupational History  . Not on file.   Social History Main Topics  . Smoking status: Never Smoker  . Smokeless tobacco: Never Used  . Alcohol use No  . Drug use: No  . Sexual activity: Not on file    Other Topics Concern  . Not on file   Social History Narrative  . No narrative on file    Current Outpatient Prescriptions on File Prior to Visit  Medication Sig Dispense Refill  . allopurinol (ZYLOPRIM) 100 MG tablet Take 100 mg by mouth at bedtime.   0  . BAYER MICROLET LANCETS lancets Use to check blood sugar 1 time per day. 100 each 2  . Calcium Citrate-Vitamin D 500-500 MG-UNIT CHEW Chew 1 capsule by mouth 3 (three) times daily.    . citric acid-potassium citrate (POLYCITRA) 1100-334 MG/5ML solution Take 30 mLs by mouth 3 (three) times daily.    . colchicine 0.6 MG tablet Take 1 tablet (0.6 mg total) by mouth 2 (two) times daily. 30 tablet 0  . Cyanocobalamin (VITAMIN B-12) 1000 MCG SUBL Place 1,000 mcg under the tongue daily.    Marland Kitchen glucose blood (BAYER CONTOUR NEXT TEST) test strip Use to check blood sugar 1 time per day. 100 each 2  . levothyroxine (SYNTHROID, LEVOTHROID) 112 MCG tablet 2 tabs daily (Patient taking differently: Take 224 mcg by mouth daily before breakfast. 2 tabs daily) 100 tablet 5  . Multiple Vitamins-Minerals (MULTIVITAMIN WITH MINERALS) tablet Take 2 tablets by mouth daily.    Marland Kitchen  nystatin-triamcinolone (MYCOLOG II) cream Apply to rash on abdomen daily for 10 days. 15 g 0   No current facility-administered medications on file prior to visit.     Allergies  Allergen Reactions  . Doxycycline Anaphylaxis  . Toradol [Ketorolac Tromethamine] Hives  . Food Swelling    Jalapeno seeds  . Ibuprofen Other (See Comments)    Do not take due to bariatric surgery  . Indomethacin     Kidney damage  . Nsaids Other (See Comments)    Do not take Due to bariatric surgery...  . Other     thyola- bottomed out blood pressure  . Prednisone     Facial swelling    Family History  Problem Relation Age of Onset  . Ovarian cancer Paternal Grandmother   . Diabetes Maternal Grandmother   . Diabetes Mother     BP 104/84   Pulse 68   Wt 233 lb (105.7 kg)   LMP  11/01/2015   BMI 41.27 kg/m    Review of Systems Denies n/v    Objective:   Physical Exam VITAL SIGNS:  See vs page GENERAL: no distress Pulses: dorsalis pedis intact bilat.   MSK: no deformity of the feet CV: no leg edema.   Skin:  no ulcer on the feet.  normal color and temp on the feet. Neuro: sensation is intact to touch on the feet.   Ext: the left great toenail is ecchymotic.        Assessment & Plan:  Obesity: improved Type 2 DM: improved with weight loss HTN: no rx is needed now.

## 2015-12-04 NOTE — Patient Instructions (Addendum)
Please stay off medication for blood pressure and diabetes for now.   Please come back for a follow-up appointment in 2-3 months.   check your blood sugar once a day.  vary the time of day when you check, between before the 3 meals, and at bedtime.  also check if you have symptoms of your blood sugar being too high or too low.  please keep a record of the readings and bring it to your next appointment here (or you can bring the meter itself).  You can write it on any piece of paper.  please call us sooner if your blood sugar goes below 70, or if you have a lot of readings over 200.

## 2015-12-21 ENCOUNTER — Emergency Department (HOSPITAL_COMMUNITY)
Admission: EM | Admit: 2015-12-21 | Discharge: 2015-12-22 | Disposition: A | Discharge status: Home / Self Care | Payer: BLUE CROSS/BLUE SHIELD | Attending: Emergency Medicine | Admitting: Emergency Medicine

## 2015-12-21 ENCOUNTER — Telehealth: Payer: Self-pay | Admitting: Family Medicine

## 2015-12-21 ENCOUNTER — Encounter (HOSPITAL_COMMUNITY): Payer: Self-pay | Admitting: *Deleted

## 2015-12-21 DIAGNOSIS — E11649 Type 2 diabetes mellitus with hypoglycemia without coma: Secondary | ICD-10-CM | POA: Diagnosis present

## 2015-12-21 DIAGNOSIS — Z79899 Other long term (current) drug therapy: Secondary | ICD-10-CM | POA: Diagnosis not present

## 2015-12-21 DIAGNOSIS — Z7984 Long term (current) use of oral hypoglycemic drugs: Secondary | ICD-10-CM | POA: Diagnosis not present

## 2015-12-21 DIAGNOSIS — E039 Hypothyroidism, unspecified: Secondary | ICD-10-CM | POA: Diagnosis not present

## 2015-12-21 DIAGNOSIS — E162 Hypoglycemia, unspecified: Secondary | ICD-10-CM

## 2015-12-21 LAB — COMPREHENSIVE METABOLIC PANEL
ALBUMIN: 3.6 g/dL (ref 3.5–5.0)
ALK PHOS: 62 U/L (ref 38–126)
ALT: 25 U/L (ref 14–54)
AST: 20 U/L (ref 15–41)
Anion gap: 14 (ref 5–15)
BILIRUBIN TOTAL: 1 mg/dL (ref 0.3–1.2)
BUN: 16 mg/dL (ref 6–20)
CO2: 18 mmol/L — ABNORMAL LOW (ref 22–32)
CREATININE: 1.19 mg/dL — AB (ref 0.44–1.00)
Calcium: 9 mg/dL (ref 8.9–10.3)
Chloride: 107 mmol/L (ref 101–111)
GFR calc Af Amer: >60 mL/min (ref >60)
GFR, EST NON AFRICAN AMERICAN: 58 mL/min — AB (ref >60)
GLUCOSE: 65 mg/dL (ref 65–99)
POTASSIUM: 3.7 mmol/L (ref 3.5–5.1)
Sodium: 139 mmol/L (ref 135–145)
TOTAL PROTEIN: 7 g/dL (ref 6.5–8.1)

## 2015-12-21 LAB — CBC WITH DIFFERENTIAL/PLATELET
BASOS ABS: 0 10*3/uL (ref 0.0–0.1)
Basophils Relative: 0 %
EOS ABS: 0.2 10*3/uL (ref 0.0–0.7)
EOS PCT: 4 %
HCT: 37.3 % (ref 36.0–46.0)
Hemoglobin: 12.9 g/dL (ref 12.0–15.0)
LYMPHS PCT: 42 %
Lymphs Abs: 2 10*3/uL (ref 0.7–4.0)
MCH: 31.2 pg (ref 26.0–34.0)
MCHC: 34.6 g/dL (ref 30.0–36.0)
MCV: 90.1 fL (ref 78.0–100.0)
MONO ABS: 0.4 10*3/uL (ref 0.1–1.0)
Monocytes Relative: 9 %
Neutro Abs: 2.2 10*3/uL (ref 1.7–7.7)
Neutrophils Relative %: 45 %
PLATELETS: 197 10*3/uL (ref 150–400)
RBC: 4.14 MIL/uL (ref 3.87–5.11)
RDW: 14.3 % (ref 11.5–15.5)
WBC: 4.8 10*3/uL (ref 4.0–10.5)

## 2015-12-21 LAB — CBG MONITORING, ED
GLUCOSE-CAPILLARY: 66 mg/dL (ref 65–99)
Glucose-Capillary: 69 mg/dL (ref 65–99)

## 2015-12-21 MED ORDER — SODIUM CHLORIDE 0.9 % IV BOLUS (SEPSIS)
500.0000 mL | Freq: Once | INTRAVENOUS | Status: AC
  Administered 2015-12-21: 500 mL via INTRAVENOUS

## 2015-12-21 NOTE — ED Provider Notes (Signed)
[facility] DEPT Provider Note   CSN: [id number] Arrival date & time: 12/21/15  2037     History   Chief Complaint Chief Complaint  Patient presents with  . Hypoglycemia    HPI Alison Fitzgerald is a 35 y.o. female.  Alison Fitzgerald is a 35 y.o. female h/o hypothyroidism, T2DM, dyslipidemia, morbid obesity s/p gastric sleeve surgery 11/19/15 presents to ED with complaint of hypoglycemia. Patient reports two day history of intermittent hypoglycemia with sugars as low as mid-40s. Patient states her diet is limited following gastric sleeve surgery and she is on a low-carb, low sugar diet. She was able to eat icing with some improvement in sugars. She reports [place] when she felt lightheaded, dizzy, and hot. She felt as if she were going to pass out; she sat on the ground and had fruit juice and felt somewhat better. Sugars at that time were roughly 47. Patient reports since the gastric sleeve surgery she has not needed to be on insulin or any diabetes medication. She is followed by Dr. Kieth Brightly for the gastric sleeve surgery. She denies fever, chest pain, shortness of breath, abdominal pain, nausea, vomiting, dysuria, hematuria, arthralgia, myalgias.       Past Medical History:  Diagnosis Date  . Diabetes mellitus without complication ([place])   . Hypothyroidism   . Injury of left great toe    due to trauma- area required stitches and stitches are out- DR Kinsinger aware per pateint   . Kidney stones   . PONV (postoperative nausea and vomiting)   . Sleep apnea    cpap- setting at 8 per patient   . Staghorn kidney stones    right nephrectomy     Patient Active Problem List   Diagnosis Date Noted  . Morbid obesity ([place]) 11/19/2015  . SVT (supraventricular tachycardia) ([place]) 02/18/2015  . PCO (polycystic ovaries) 01/29/2015  . Dyslipidemia 01/29/2015  . Diabetes mellitus without complication ([place])   . Hypothyroidism   . Staghorn kidney stones       Past Surgical History:  Procedure Laterality Date  . CESAREAN SECTION    . cystocopy      multiple times   . EXCISION OF ENDOMETRIOMA     removed 2009   . LAPAROSCOPIC GASTRIC SLEEVE RESECTION N/A 11/19/2015   Procedure: LAPAROSCOPIC GASTRIC SLEEVE RESECTION, UPPER ENDO;  Surgeon: Arta Bruce Kinsinger, MD;  Location: [facility];  Service: General;  Laterality: N/A;  . NEPHRECTOMY    . PERCUTANEOUS NEPHROSTOMY     multiple times   . URETEROSCOPY     multiple times     OB History    No data available       Home Medications    Prior to Admission medications   Medication Sig Start Date End Date Taking? Authorizing Provider  allopurinol (ZYLOPRIM) 300 MG tablet Take 300 mg by mouth at bedtime. 12/06/15  Yes Historical Provider, MD  BAYER MICROLET LANCETS lancets Use to check blood sugar 1 time per day. 04/12/15  Yes Renato Shin, MD  Calcium Citrate-Vitamin D 500-500 MG-UNIT CHEW Chew 1 capsule by mouth 3 (three) times daily.   Yes Historical Provider, MD  citric acid-potassium citrate (POLYCITRA) 1100-334 MG/5ML solution Take 30 mLs by mouth 3 (three) times daily.   Yes Historical Provider, MD  colchicine 0.6 MG tablet Take 1 tablet (0.6 mg total) by mouth 2 (two) times daily. Patient taking differently: Take 0.6 mg by mouth 2 (two) times daily as needed. For gout  flare-ups. 03/22/15  Yes Rolland Porter, MD  Cyanocobalamin (VITAMIN B-12) 1000 MCG SUBL Place 1,000 mcg under the tongue daily.   Yes Historical Provider, MD  glucose blood (BAYER CONTOUR NEXT TEST) test strip Use to check blood sugar 1 time per day. 04/12/15  Yes Renato Shin, MD  levothyroxine (SYNTHROID, LEVOTHROID) 112 MCG tablet 2 tabs daily Patient taking differently: Take 224 mcg by mouth daily before breakfast. 2 tabs daily 11/06/15  Yes Renato Shin, MD  Multiple Vitamins-Minerals (MULTIVITAMIN WITH MINERALS) tablet Take 2 tablets by mouth daily.   Yes Historical Provider, MD  nystatin-triamcinolone (MYCOLOG II) cream Apply  to rash on abdomen daily for 10 days. Patient not taking: Reported on 12/22/2015 08/25/15   Evalee Jefferson, PA-C    Family History Family History  Problem Relation Age of Onset  . Ovarian cancer Paternal Grandmother   . Diabetes Maternal Grandmother   . Diabetes Mother     Social History Social History  Substance Use Topics  . Smoking status: Never Smoker  . Smokeless tobacco: Never Used  . Alcohol use No     Allergies   Doxycycline; Toradol [ketorolac tromethamine]; Food; Ibuprofen; Indomethacin; Nsaids; Other; and Prednisone   Review of Systems Review of Systems  Constitutional: Negative for chills, diaphoresis and fever.  HENT: Negative for trouble swallowing.   Eyes: Negative for visual disturbance.  Respiratory: Negative for shortness of breath.   Cardiovascular: Negative for chest pain.  Gastrointestinal: Negative for abdominal pain, blood in stool, constipation, diarrhea, nausea and vomiting.  Genitourinary: Negative for dysuria and hematuria.  Musculoskeletal: Negative for arthralgias and myalgias.  Skin: Positive for rash.  Neurological: Positive for light-headedness. Negative for syncope, weakness, numbness and headaches.     Physical Exam Updated Vital Signs BP (!) 151/102   Pulse 80   Temp 97.8 F (36.6 C) (Oral)   Resp 18   Ht 5\' 3"  (1.6 m)   Wt 102.5 kg   LMP 12/02/2015   SpO2 100%   BMI 40.03 kg/m   Physical Exam  Constitutional: She appears well-developed and well-nourished. No distress.  HENT:  Head: Normocephalic and atraumatic.  Mouth/Throat: Oropharynx is clear and moist. No oropharyngeal exudate.  Eyes: Conjunctivae and EOM are normal. Pupils are equal, round, and reactive to light. Right eye exhibits no discharge. Left eye exhibits no discharge. No scleral icterus.  Neck: Normal range of motion and phonation normal. Neck supple. No neck rigidity. Normal range of motion present.  Cardiovascular: Normal rate, regular rhythm, normal heart  sounds and intact distal pulses.   No murmur heard. Pulmonary/Chest: Effort normal and breath sounds normal. No stridor. No respiratory distress. She has no wheezes. She has no rales.  Abdominal: Soft. Bowel sounds are normal. She exhibits no distension. There is no tenderness. There is no rigidity, no rebound, no guarding and no CVA tenderness.  Well healing surgical scars. No erythema, swelling, pain, or drainage.   Musculoskeletal: Normal range of motion.  Lymphadenopathy:    She has no cervical adenopathy.  Neurological: She is alert. She is not disoriented. Coordination and gait normal. GCS eye subscore is 4. GCS verbal subscore is 5. GCS motor subscore is 6.  Skin: Skin is warm and dry. She is not diaphoretic.     Psychiatric: She has a normal mood and affect. Her behavior is normal.     ED Treatments / Results  Labs (all labs ordered are listed, but only abnormal results are displayed) Labs Reviewed  COMPREHENSIVE METABOLIC PANEL - Abnormal;  Notable for the following:       Result Value   CO2 18 (*)    Creatinine, Ser 1.19 (*)    GFR calc non Af Amer 58 (*)    All other components within normal limits  CBG MONITORING, ED - Abnormal; Notable for the following:    Glucose-Capillary 61 (*)    All other components within normal limits  CBC WITH DIFFERENTIAL/PLATELET  CBG MONITORING, ED  CBG MONITORING, ED    EKG  EKG Interpretation None       Radiology No results found.  Procedures Procedures (including critical care time)  Medications Ordered in ED Medications  sodium chloride 0.9 % bolus 500 mL (0 mLs Intravenous Stopped 12/22/15 0202)     Initial Impression / Assessment and Plan / ED Course  I have reviewed the triage vital signs and the nursing notes.  Pertinent labs & imaging results that were available during my care of the patient were reviewed by me and considered in my medical decision making (see chart for details).  Clinical Course    Patient  presents to ED with complaint of hypoglycemia. Patient is afebrile and non-toxic appearing in NAD. VSS. No fever, no cough/congestion, no abdominal pain, no dysuria. Physical exam is re-assuring - lungs are clear to auscultation, abdomen is soft and non-tender with +BS, well-healing surgical scars without evidence of infection. CBG monitoring in ED shows sugars >60. CBC re-assuring, no evidence of anemia or leukocytosis. CMP remarkable for mild bump in creatinine - ?dehydration - patient is on a limited diet, out at fair today, increase in exercise this week. Unclear cause of hypoglycemia.   Discussed results with patient. Patient received IVF and apple sauce in ED, endorses improvement in sxs. Encouraged pt to call Dr. Kieth Brightly in AM regarding dietary recommendations to assist with controlling sugars. Encouraged continued monitoring of sugars at home, eating frequent small meals, and staying hydrated. Return precautions given. Patient voiced understanding and is agreeable.   Final Clinical Impressions(s) / ED Diagnoses   Final diagnoses:  Hypoglycemia    New Prescriptions Discharge Medication List as of 12/22/2015  1:59 AM       Roxanna Mew, PA-C 12/22/15 1407    Charlesetta Shanks, MD 12/24/15 1901

## 2015-12-21 NOTE — ED Triage Notes (Signed)
Pt states that she had the gastric sleeve done on 8/15; pt states that for the last 2 days she has been dropping her blood sugar into the 40's; pt states that she has been having a difficult time getting her blood sugar to come up due to the food restrictions from the sleeve

## 2015-12-21 NOTE — Telephone Encounter (Signed)
Received call from team health RN regarding this patient. She reports that the patient has had issues with her blood sugars being too low since her gastric sleeve surgery. She notes she is no longer on medication for this. She checked her blood sugar about 45 minutes ago and it was 42. She ate some icing and it only came up to 62. She notes it was also in the 71s yesterday. She feels lightheaded and reports that she does not feel well. Given that she has gastric sleeve oral sugar intake could cause dumping. Given symptomatic hypoglycemia I advised that she be evaluated for this with consideration of getting labs and possibly IV glucose if needed. Given the time of day as suggested ED evaluation. The team health RN noted she would advise the patient to seek medical attention for this. I will forward this to the patient's endocrinologist.

## 2015-12-22 LAB — CBG MONITORING, ED: Glucose-Capillary: 61 mg/dL — ABNORMAL LOW (ref 65–99)

## 2015-12-22 NOTE — Discharge Instructions (Signed)
Read the information below.   Your labs were re-assuring in the ED. You blood sugar stayed above 60. Continue to eat small meals, ricotta cheese and cottage cheese both have some carbohydrates. Continue to monitor blood sugars - if drop again please return to ED.  Please call Dr. Amie Portland on-call number in the morning regarding diet recommendations.   You may return to the Emergency Department at any time for worsening condition or any new symptoms that concern you. Return to ED if your sugars drop or you have a loss of consciousness, generalized fatigue/weakness, changes in vision, abdominal pain, or vomiting.

## 2015-12-23 ENCOUNTER — Telehealth: Payer: Self-pay | Admitting: Endocrinology

## 2015-12-23 NOTE — Telephone Encounter (Signed)
Patient scheduled for 7:45am tomorrow.

## 2015-12-23 NOTE — Telephone Encounter (Signed)
Ov tomorrow, 07:45

## 2015-12-23 NOTE — Telephone Encounter (Signed)
[facility] Call:  Caller states she had the gastric sleeve surgery last month and her blood sugar is at 57 and she feels lightheaded.

## 2015-12-23 NOTE — Telephone Encounter (Signed)
See message and please advise, Thanks!  

## 2015-12-24 ENCOUNTER — Encounter: Payer: Self-pay | Admitting: Endocrinology

## 2015-12-24 ENCOUNTER — Ambulatory Visit (INDEPENDENT_AMBULATORY_CARE_PROVIDER_SITE_OTHER): Payer: BLUE CROSS/BLUE SHIELD | Admitting: Endocrinology

## 2015-12-24 VITALS — BP 126/82 | HR 84 | Ht 64.0 in | Wt 224.0 lb

## 2015-12-24 DIAGNOSIS — E162 Hypoglycemia, unspecified: Secondary | ICD-10-CM

## 2015-12-24 DIAGNOSIS — E119 Type 2 diabetes mellitus without complications: Secondary | ICD-10-CM | POA: Diagnosis not present

## 2015-12-24 DIAGNOSIS — E039 Hypothyroidism, unspecified: Secondary | ICD-10-CM | POA: Diagnosis not present

## 2015-12-24 LAB — CORTISOL
CORTISOL PLASMA: 18.3 ug/dL
CORTISOL PLASMA: 24.2 ug/dL

## 2015-12-24 LAB — TSH: TSH: 3.42 u[IU]/mL (ref 0.35–4.50)

## 2015-12-24 MED ORDER — COSYNTROPIN NICU IV SYRINGE 0.25 MG/ML (STANDARD DOSE)
0.2500 mg | Freq: Once | INTRAVENOUS | Status: AC
  Administered 2015-12-24: 0.25 mg via INTRAMUSCULAR

## 2015-12-24 NOTE — Progress Notes (Signed)
Subjective:    Patient ID: Alison Fitzgerald, female    DOB: [birth year], 35 y.o.   MRN: [id number]  HPI Pt returns for f/u of diabetes mellitus: DM type: Insulin-requiring type 2 Dx'ed: 0000000 Complications: none.   Therapy: none now GDM: 2007 DKA: never Severe hypoglycemia: never Pancreatitis: 3 episodes (most recent was 2015; no cause was determined). Other: she took insulin from 2012 until 2017 gastric sleeve surgery; she has had TL.   Interval history: She has been seen in ER last week, for severe hypoglycemia.  Since she started preop diet, she has lost 42 lbs.  she is off DM meds now.  She was last on steroids in late 2016.  She has frequent hypoglycemia 1-2 hrs after a meal.  She has seen dietician for this.  She has sxs of moderate palpitations in the chest, and assoc dizziness.  Past Medical History:  Diagnosis Date  . Diabetes mellitus without complication ([place])   . Hypothyroidism   . Injury of left great toe    due to trauma- area required stitches and stitches are out- DR Kinsinger aware per pateint   . Kidney stones   . PONV (postoperative nausea and vomiting)   . Sleep apnea    cpap- setting at 8 per patient   . Staghorn kidney stones    right nephrectomy     Past Surgical History:  Procedure Laterality Date  . CESAREAN SECTION    . cystocopy      multiple times   . EXCISION OF ENDOMETRIOMA     removed 2009   . LAPAROSCOPIC GASTRIC SLEEVE RESECTION N/A 11/19/2015   Procedure: LAPAROSCOPIC GASTRIC SLEEVE RESECTION, UPPER ENDO;  Surgeon: Arta Bruce Kinsinger, MD;  Location: [facility];  Service: General;  Laterality: N/A;  . NEPHRECTOMY    . PERCUTANEOUS NEPHROSTOMY     multiple times   . URETEROSCOPY     multiple times     Social History   Social History  . Marital status: Married    Spouse name: N/A  . Number of children: N/A  . Years of education: N/A   Occupational History  . Not on file.   Social History Main Topics  . Smoking status: Never  Smoker  . Smokeless tobacco: Never Used  . Alcohol use No  . Drug use: No  . Sexual activity: Not on file   Other Topics Concern  . Not on file   Social History Narrative  . No narrative on file    Current Outpatient Prescriptions on File Prior to Visit  Medication Sig Dispense Refill  . allopurinol (ZYLOPRIM) 300 MG tablet Take 300 mg by mouth at bedtime.  1  . BAYER MICROLET LANCETS lancets Use to check blood sugar 1 time per day. 100 each 2  . Calcium Citrate-Vitamin D 500-500 MG-UNIT CHEW Chew 1 capsule by mouth 3 (three) times daily.    . citric acid-potassium citrate (POLYCITRA) 1100-334 MG/5ML solution Take 30 mLs by mouth 3 (three) times daily.    . colchicine 0.6 MG tablet Take 1 tablet (0.6 mg total) by mouth 2 (two) times daily. (Patient taking differently: Take 0.6 mg by mouth 2 (two) times daily as needed. For gout flare-ups.) 30 tablet 0  . Cyanocobalamin (VITAMIN B-12) 1000 MCG SUBL Place 1,000 mcg under the tongue daily.    Marland Kitchen glucose blood (BAYER CONTOUR NEXT TEST) test strip Use to check blood sugar 1 time per day. 100 each 2  . levothyroxine (SYNTHROID, LEVOTHROID) 112  MCG tablet 2 tabs daily (Patient taking differently: Take 224 mcg by mouth daily before breakfast. 2 tabs daily) 100 tablet 5  . Multiple Vitamins-Minerals (MULTIVITAMIN WITH MINERALS) tablet Take 2 tablets by mouth daily.    Marland Kitchen nystatin-triamcinolone (MYCOLOG II) cream Apply to rash on abdomen daily for 10 days. 15 g 0   No current facility-administered medications on file prior to visit.     Allergies  Allergen Reactions  . Doxycycline Anaphylaxis  . Toradol [Ketorolac Tromethamine] Hives  . Food Swelling    Jalapeno seeds  . Ibuprofen Other (See Comments)    Do not take due to bariatric surgery  . Indomethacin     Kidney damage  . Nsaids Other (See Comments)    Do not take Due to bariatric surgery...  . Other     thyola- bottomed out blood pressure  . Prednisone     Facial swelling     Family History  Problem Relation Age of Onset  . Ovarian cancer Paternal Grandmother   . Diabetes Maternal Grandmother   . Diabetes Mother     BP 126/82   Pulse 84   Ht 5\' 4"  (1.626 m)   Wt 224 lb (101.6 kg)   LMP 12/02/2015   SpO2 98%   BMI 38.45 kg/m    Review of Systems Denies n/v.      Objective:   Physical Exam VITAL SIGNS:  See vs page GENERAL: no distress Pulses: dorsalis pedis intact bilat.   MSK: no deformity of the feet.   CV: no leg edema.  Skin:  no ulcer on the feet.  normal color and temp on the feet.  Neuro: sensation is intact to touch on the feet.   Ext: ecchymosis of the left great toenail.     ACTH stimulation test is done: baseline cortisol level=18 then Cosyntropin 250 mcg is given im 45 minutes later, cortisol level=24 (normal response)  Lab Results  Component Value Date   TSH 3.42 12/24/2015       Assessment & Plan:  Type 2 DM: well-controlled off rx Reactive hypoglycemia, new to me, due to weight loss

## 2015-12-24 NOTE — Patient Instructions (Addendum)
blood tests are requested for you today.  We'll let you know about the results. Please continue to follow the advice of your dietician.   Continuing to lose weight will help this.   Please come back for a follow-up appointment in 2 months.

## 2015-12-26 LAB — FRUCTOSAMINE: FRUCTOSAMINE: 184 umol/L — AB (ref 190–270)

## 2016-01-14 ENCOUNTER — Encounter: Payer: Self-pay | Admitting: Dietician

## 2016-01-14 ENCOUNTER — Encounter: Payer: BLUE CROSS/BLUE SHIELD | Attending: General Surgery | Admitting: Dietician

## 2016-01-14 DIAGNOSIS — Z713 Dietary counseling and surveillance: Secondary | ICD-10-CM | POA: Diagnosis present

## 2016-01-14 DIAGNOSIS — Z6837 Body mass index (BMI) 37.0-37.9, adult: Secondary | ICD-10-CM | POA: Diagnosis not present

## 2016-01-14 NOTE — Progress Notes (Signed)
  Follow-up visit:  8 Weeks Post-Operative Sleeve gastrectomy Surgery  Medical Nutrition Therapy:  Appt start time: 1010 end time:  [id number].  Primary concerns today: Post-operative Bariatric Surgery Nutrition Management. Vauda returns having lost a total of 58 lbs. She is still having low blood sugars when she exercises. She has been including yogurt, beans, and milk to help stabilize her blood sugars throughout the day. Has developed gout 3x since surgery. Can tolerate 2.5-3.5 oz meat at a time. Has tried some green beans and tolerated. Still not tolerating string cheese but can eat other cheese.   Surgery date: 11/19/2015 Surgery type: sleeve gastrectomy Start weight at [facility]: 271 lbs on 06/26/2015 Weight today: 213 lbs Weight change: 21.4 lbs Total weight lost: 58 lbs   TANITA  BODY COMP RESULTS  10/28/15 12/03/15 01/14/16   BMI (kg/m^2) 46.2 41.5 37.7   Fat Mass (lbs) 114.6 110.6 93.2   Fat Free Mass (lbs) 154.4 123.8 119.8   Total Body Water (lbs) 113.6 90.6 87    Preferred Learning Style:   No preference indicated   Learning Readiness:   Ready  24-hr recall: B (AM): [place] sausage patty and an egg (10g) Snk (AM): SF popsicle  L (PM): 3 oz fish or chicken and beans (21g)  Snk (PM): milk (10g)  D (PM): meat and beans (21g) Snk (PM): [place] light and fit yogurt or milk (10g)  Premier shake throughout the day  Fluid intake: 2-3 sugar free popsicles, 11 oz protein shake, water, Crystal Light, Powerade Zero (64 oz per patient) Estimated total protein intake: 100g/day  Medications: no longer taking HTN or diabetes meds Supplementation: taking (Bariatric Advantage MVI)  CBG monitoring: 1x a day, more if she feels bad  Average CBG per patient: 65-68 g/day Last patient reported A1c: 4.7%  Using straws: no Drinking while eating: no Hair loss: some shedding  Carbonated beverages: none N/V/D/C: constipation, Milk of Magnesia   Dumping syndrome: no  Recent physical  activity:  Limited due to gout, walking  Progress Towards Goal(s):  In progress.  Handouts given during visit include:  Phase 3B lean protein + non starchy vegetables   Nutritional Diagnosis:  [place]-3.3 Overweight/obesity related to past poor dietary habits and physical inactivity as evidenced by patient w/ recent sleeve gastrectomy surgery following dietary guidelines for continued weight loss.  Intervention:  Nutrition counseling provided.  Teaching Method Utilized:  Visual Auditory Hands on  Barriers to learning/adherence to lifestyle change: none  Demonstrated degree of understanding via:  Teach Back   Monitoring/Evaluation:  Dietary intake, exercise, and body weight. Follow up in 2 months for 4 month post-op visit.

## 2016-01-14 NOTE — Patient Instructions (Addendum)
Goals:  Follow Phase 3B: High Protein + Non-Starchy Vegetables  Eat 3-6 small meals/snacks, every 3-5 hrs  Increase lean protein foods to meet 60g goal  Increase fluid intake to 64oz +  Avoid drinking 15 minutes before, during and 30 minutes after eating  Aim for >30 min of physical activity daily  Surgery date: 11/19/2015 Surgery type: sleeve gastrectomy Start weight at [facility]: 271 lbs on 06/26/2015 Weight today: 213 lbs Weight change: 21.4 lbs Total weight lost: 58 lbs   TANITA  BODY COMP RESULTS  10/28/15 12/03/15 01/14/16   BMI (kg/m^2) 46.2 41.5 37.7   Fat Mass (lbs) 114.6 110.6 93.2   Fat Free Mass (lbs) 154.4 123.8 119.8   Total Body Water (lbs) 113.6 90.6 87

## 2016-01-29 ENCOUNTER — Other Ambulatory Visit: Payer: Self-pay | Admitting: Urology

## 2016-01-29 DIAGNOSIS — N200 Calculus of kidney: Secondary | ICD-10-CM

## 2016-03-04 ENCOUNTER — Ambulatory Visit: Payer: Self-pay | Admitting: Urology

## 2016-03-05 ENCOUNTER — Ambulatory Visit (INDEPENDENT_AMBULATORY_CARE_PROVIDER_SITE_OTHER): Payer: BLUE CROSS/BLUE SHIELD | Admitting: Endocrinology

## 2016-03-05 ENCOUNTER — Encounter: Payer: Self-pay | Admitting: Endocrinology

## 2016-03-05 VITALS — BP 122/82 | HR 96 | Ht 64.0 in | Wt 203.0 lb

## 2016-03-05 DIAGNOSIS — E119 Type 2 diabetes mellitus without complications: Secondary | ICD-10-CM | POA: Diagnosis not present

## 2016-03-05 LAB — POCT GLYCOSYLATED HEMOGLOBIN (HGB A1C): HEMOGLOBIN A1C: 5.2

## 2016-03-05 NOTE — Patient Instructions (Addendum)
Please continue your dietary efforts. You need to check the blood sugar only as needed.     Please come back for a follow-up appointment in 6 months.

## 2016-03-05 NOTE — Progress Notes (Signed)
Subjective:    Patient ID: Alison Fitzgerald, female    DOB: 1980/10/12, 35 y.o.   MRN: [id number]  HPI Pt returns for f/u of diabetes mellitus: DM type: 2 Dx'ed: 0000000 Complications: none.   Therapy: none now GDM: 2007 DKA: never Severe hypoglycemia: never Pancreatitis: 3 episodes (most recent was 2015; no cause was determined).   Other: she took insulin from 2012 until 2017 gastric sleeve surgery; she has had TL; for postprandial hypoglycemia, she had ACTH stim test was in 2017 (normal). She has seen dietician for this. Interval history: Since she started preop diet, she has lost 63 lbs.  She was last on steroids in late 2016.  pt states she feels well in general.   Past Medical History:  Diagnosis Date  . Diabetes mellitus without complication ([place])   . Hypothyroidism   . Injury of left great toe    due to trauma- area required stitches and stitches are out- DR Kinsinger aware per pateint   . Kidney stones   . PONV (postoperative nausea and vomiting)   . Sleep apnea    cpap- setting at 8 per patient   . Staghorn kidney stones    right nephrectomy     Past Surgical History:  Procedure Laterality Date  . CESAREAN SECTION    . cystocopy      multiple times   . EXCISION OF ENDOMETRIOMA     removed 2009   . LAPAROSCOPIC GASTRIC SLEEVE RESECTION N/A 11/19/2015   Procedure: LAPAROSCOPIC GASTRIC SLEEVE RESECTION, UPPER ENDO;  Surgeon: Arta Bruce Kinsinger, MD;  Location: [facility];  Service: General;  Laterality: N/A;  . NEPHRECTOMY    . PERCUTANEOUS NEPHROSTOMY     multiple times   . URETEROSCOPY     multiple times     Social History   Social History  . Marital status: Married    Spouse name: N/A  . Number of children: N/A  . Years of education: N/A   Occupational History  . Not on file.   Social History Main Topics  . Smoking status: Never Smoker  . Smokeless tobacco: Never Used  . Alcohol use No  . Drug use: No  . Sexual activity: Not on file   Other  Topics Concern  . Not on file   Social History Narrative  . No narrative on file    Current Outpatient Prescriptions on File Prior to Visit  Medication Sig Dispense Refill  . allopurinol (ZYLOPRIM) 300 MG tablet Take 300 mg by mouth at bedtime.  1  . BAYER MICROLET LANCETS lancets Use to check blood sugar 1 time per day. 100 each 2  . Calcium Citrate-Vitamin D 500-500 MG-UNIT CHEW Chew 1 capsule by mouth 3 (three) times daily.    . citric acid-potassium citrate (POLYCITRA) 1100-334 MG/5ML solution Take 30 mLs by mouth 3 (three) times daily.    . colchicine 0.6 MG tablet Take 1 tablet (0.6 mg total) by mouth 2 (two) times daily. (Patient taking differently: Take 0.6 mg by mouth 2 (two) times daily as needed. For gout flare-ups.) 30 tablet 0  . Cyanocobalamin (VITAMIN B-12) 1000 MCG SUBL Place 1,000 mcg under the tongue daily.    Marland Kitchen glucose blood (BAYER CONTOUR NEXT TEST) test strip Use to check blood sugar 1 time per day. 100 each 2  . levothyroxine (SYNTHROID, LEVOTHROID) 112 MCG tablet 2 tabs daily (Patient taking differently: Take 224 mcg by mouth daily before breakfast. 2 tabs daily) 100 tablet 5  .  Multiple Vitamins-Minerals (MULTIVITAMIN WITH MINERALS) tablet Take 2 tablets by mouth daily.    Marland Kitchen nystatin-triamcinolone (MYCOLOG II) cream Apply to rash on abdomen daily for 10 days. 15 g 0   No current facility-administered medications on file prior to visit.     Allergies  Allergen Reactions  . Doxycycline Anaphylaxis  . Toradol [Ketorolac Tromethamine] Hives  . Food Swelling    Jalapeno seeds  . Ibuprofen Other (See Comments)    Do not take due to bariatric surgery  . Indomethacin     Kidney damage  . Nsaids Other (See Comments)    Do not take Due to bariatric surgery...  . Other     thyola- bottomed out blood pressure  . Prednisone     Facial swelling    Family History  Problem Relation Age of Onset  . Ovarian cancer Paternal Grandmother   . Diabetes Maternal  Grandmother   . Diabetes Mother     BP 122/82   Pulse 96   Ht 5\' 4"  (1.626 m)   Wt 203 lb (92.1 kg)   SpO2 96%   BMI 34.84 kg/m    Review of Systems She denies hypoglycemia    Objective:   Physical Exam VITAL SIGNS:  See vs page GENERAL: no distress Pulses: dorsalis pedis intact bilat.   MSK: no deformity of the feet.   CV: no leg edema.  Skin:  no ulcer on the feet.  normal color and temp on the feet.  Neuro: sensation is intact to touch on the feet.   Ext: ecchymosis of the left great toenail.   Lab Results  Component Value Date   HGBA1C 5.2 03/05/2016       Assessment & Plan:  Type 2 DM: well-controlled Obesity: much better since surgery Patient is advised the following: Patient Instructions  Please continue your dietary efforts. You need to check the blood sugar only as needed.     Please come back for a follow-up appointment in 6 months.

## 2016-03-17 ENCOUNTER — Encounter: Payer: BLUE CROSS/BLUE SHIELD | Attending: General Surgery | Admitting: Dietician

## 2016-03-17 DIAGNOSIS — Z6837 Body mass index (BMI) 37.0-37.9, adult: Secondary | ICD-10-CM | POA: Diagnosis not present

## 2016-03-17 DIAGNOSIS — Z713 Dietary counseling and surveillance: Secondary | ICD-10-CM | POA: Diagnosis not present

## 2016-03-17 NOTE — Progress Notes (Signed)
  Follow-up visit:  4 months Post-Operative Sleeve gastrectomy Surgery  Medical Nutrition Therapy:  Appt start time: [id number] end time:  1110  Primary concerns today: Post-operative Bariatric Surgery Nutrition Management. Mailinh returns having lost a total of 73.6 lbs. She reports having rashes and gout flares and notices that when she reduces protein shakes these symptoms improve. Treadmill 30 minutes daily + weights at the gym 3-4 days a week. Was advised to increase fluid intake to 100 oz and reduce seafood intake. Feeling very hungry in the evenings and has moved her dinner to a later time. Frustrated with plateaus but excited to not have gained weight on vacation over Thanksgiving.  Surgery date: 11/19/2015 Surgery type: sleeve gastrectomy [facility] weight at [facility]: 271 lbs on 06/26/2015 Weight today: 197.4 lbs Weight change: 16 lbs Total weight lost: 73.6 lbs  Goal weight: 140 lbs    TANITA  BODY COMP RESULTS  10/28/15 12/03/15 01/14/16 03/17/16   BMI (kg/m^2) 46.2 41.5 37.7 35   Fat Mass (lbs) 114.6 110.6 93.2 81.8   Fat Free Mass (lbs) 154.4 123.8 119.8 115.6   Total Body Water (lbs) 113.6 90.6 87 83.4    Preferred Learning Style:   No preference indicated   Learning Readiness:   Ready  24-hr recall: B (AM): 1 [place] sausage patty and 1 egg (10g) Snk (AM): 1/2 protein shake or 1/2 yogurt (7-15g) L (PM): [place] patty and beans Snk (PM): 4 oz fair life milk (5g) D (PM): meat and vegetable (21g) Snk (PM): rest of shake or yogurt (7-15g)  Premier shake throughout the day  Fluid intake: up to 86 oz total; 2-3 sugar free popsicles, 11 oz protein shake, water, Crystal Light, Powerade Zero (64 oz per patient) Estimated total protein intake: 100g/day  Medications: no longer taking HTN or diabetes meds Supplementation: taking (Bariatric Advantage MVI)  CBG monitoring: 1x a day, more if she feels bad  Average CBG per patient: 65-68 g/day Last patient reported A1c: 4.7%  Using  straws: no Drinking while eating: no Hair loss: some shedding  Carbonated beverages: none N/V/D/C: constipation, Milk of Magnesia   Dumping syndrome: no  Recent physical activity: 30 minutes on treadmill daily + weight training 3-4 days a week  Progress Towards Goal(s):  In progress.  Handouts given during visit include:  none   Nutritional Diagnosis:  [place]-3.3 Overweight/obesity related to past poor dietary habits and physical inactivity as evidenced by patient w/ recent sleeve gastrectomy surgery following dietary guidelines for continued weight loss.  Intervention:  Nutrition counseling provided.  Teaching Method Utilized:  Visual Auditory Hands on  Barriers to learning/adherence to lifestyle change: none  Demonstrated degree of understanding via:  Teach Back   Monitoring/Evaluation:  Dietary intake, exercise, and body weight. Follow up in 2 months for 6 month post-op visit.

## 2016-03-17 NOTE — Patient Instructions (Addendum)
  Goals:  Continue your activity level  Aim for 45-60 grams of carbs per day (spaced out)   Eat the whole carton of yogurt for a snack if you want to!  Listen to your body instead of counting ounces  Keep doing your celery and peanut butter in the evenings when you need it  Continue to ask for what you need from your family  Habits to keep: -No soda -Protein and veggies first and limited starches -Revising recipes -Keeping healthy options at home (meal and snack preparation) -Feeding your body healthy foods -Being self aware and knowing what helps you be successful -Asking for what you need  -Controlling your environment -Surrounding yourself with positive people -Handling stress in healthy ways: exercise, talking to a friend, sending a direct message  Surgery date: 11/19/2015 Surgery type: sleeve gastrectomy Start weight at [facility]: 271 lbs on 06/26/2015 Weight today: 197.4 lbs Weight change: 16 lbs Total weight lost: 73.6 lbs Goal weight: 140 lbs  TANITA  BODY COMP RESULTS  10/28/15 12/03/15 01/14/16 03/17/16   BMI (kg/m^2) 46.2 41.5 37.7 35   Fat Mass (lbs) 114.6 110.6 93.2 81.8   Fat Free Mass (lbs) 154.4 123.8 119.8 115.6   Total Body Water (lbs) 113.6 90.6 87 83.4

## 2016-03-18 ENCOUNTER — Encounter: Payer: Self-pay | Admitting: Dietician

## 2016-05-19 ENCOUNTER — Ambulatory Visit: Payer: Self-pay | Admitting: Dietician

## 2016-05-26 ENCOUNTER — Encounter: Payer: Self-pay | Admitting: Skilled Nursing Facility1

## 2016-05-26 ENCOUNTER — Encounter: Payer: BLUE CROSS/BLUE SHIELD | Attending: General Surgery | Admitting: Skilled Nursing Facility1

## 2016-05-26 DIAGNOSIS — Z713 Dietary counseling and surveillance: Secondary | ICD-10-CM | POA: Diagnosis not present

## 2016-05-26 DIAGNOSIS — E119 Type 2 diabetes mellitus without complications: Secondary | ICD-10-CM

## 2016-05-26 DIAGNOSIS — Z6837 Body mass index (BMI) 37.0-37.9, adult: Secondary | ICD-10-CM | POA: Diagnosis not present

## 2016-05-26 NOTE — Progress Notes (Signed)
  Follow-up visit:  4 months Post-Operative Sleeve gastrectomy Surgery  Medical Nutrition Therapy:  Appt start time: [id number] end time:  1110  Primary concerns today: Post-operative Bariatric Surgery Nutrition Management.  Pt states she has been Nauseated and vomiting for 2 weeks: first wake up in the morning really nauseated and when laying down, no heart burn, pt states she has had a tubal ligation so she is not pregnant. Pt states her doctor told her she has the stomach bug. Pt states she feels a pressure in her stomach after 2 bites feeling like the food gets stuck. Pt has checked her blood sugar when she is feeling nauseous and it is about 113.  Pt states she gets sweaty and lightheaded before she throws up. Pt states she has been exhausted and only slept about 2 hours.  Pt states she usually had eaten about 20-30 minutes before when she starts to throw up. Pt states she only has one kidney. Pt states she has been Throwing up every day some days twice some days 5 or 6. Pt states she could tolerate 4-5 ounces at once and now can only tolerate a couple bites.  Pt denies: tetany and general weakness and states she takes her multivitamin and calcium every day.    Surgery date: 11/19/2015 Surgery type: sleeve gastrectomy Start weight at [facility]: 271 lbs on 06/26/2015 Weight today: 194.8 lbs Weight change: 3 lbs  Goal weight: 140 lbs    TANITA  BODY COMP RESULTS  10/28/15 12/03/15 01/14/16 03/17/16 05/27/2015   BMI (kg/m^2) 46.2 41.5 37.7 35 33.4   Fat Mass (lbs) 114.6 110.6 93.2 81.8 75   Fat Free Mass (lbs) 154.4 123.8 119.8 115.6 119.8   Total Body Water (lbs) 113.6 90.6 87 83.4 86.4    Preferred Learning Style:   No preference indicated   Learning Readiness:   Ready  24-hr recall: B (AM): protein shake and coffee Snk (AM):  L (PM): yogurt Snk (PM):  D (PM):  Snk (PM):   Premier shake throughout the day  Fluid intake: up to 86 oz total; 2-3 sugar free popsicles, 11 oz protein  shake, water, Crystal Light, Powerade Zero (64 oz per patient), coffee with protein shake Estimated total protein intake: 100g/day  Medications: no longer taking HTN or diabetes meds Supplementation: taking (Bariatric Advantage MVI), taking calcium, taking b12  CBG monitoring: 1x a day, more if she feels bad  Average CBG per patient: 80-115 g/day Last patient reported A1c: 4.7%  Using straws: no Drinking while eating: no Hair loss: YES Carbonated beverages: none N/V/D/C: YES, YES, NO,NO Dumping syndrome: no  Recent physical activity: 30 minutes on treadmill daily + weight training 3-4 days a week  Progress Towards Goal(s):  In progress.  Handouts given during visit include:  none   Nutritional Diagnosis:  [place]-3.3 Overweight/obesity related to past poor dietary habits and physical inactivity as evidenced by patient w/ recent sleeve gastrectomy surgery following dietary guidelines for continued weight loss.  Intervention:  Nutrition counseling provided.  Teaching Method Utilized:  Visual Auditory Hands on  Barriers to learning/adherence to lifestyle change: none  Demonstrated degree of understanding via:  Teach Back   Monitoring/Evaluation:  Dietary intake, exercise, and body weight. Follow up in 2 months for 6 month post-op visit.

## 2016-05-26 NOTE — Patient Instructions (Addendum)
-  Keep drinking fluids  -Keep drinking protein shakes, try for 2 a day  -Call your surgeons office

## 2016-05-28 ENCOUNTER — Other Ambulatory Visit (HOSPITAL_COMMUNITY): Payer: Self-pay | Admitting: General Surgery

## 2016-05-28 DIAGNOSIS — R112 Nausea with vomiting, unspecified: Secondary | ICD-10-CM

## 2016-06-02 ENCOUNTER — Encounter (HOSPITAL_COMMUNITY): Payer: Self-pay

## 2016-06-02 ENCOUNTER — Ambulatory Visit (HOSPITAL_COMMUNITY): Payer: BLUE CROSS/BLUE SHIELD

## 2016-06-10 ENCOUNTER — Ambulatory Visit (HOSPITAL_COMMUNITY)
Admission: RE | Admit: 2016-06-10 | Discharge: 2016-06-10 | Disposition: A | Discharge status: Home / Self Care | Payer: BLUE CROSS/BLUE SHIELD | Source: Ambulatory Visit | Attending: General Surgery | Admitting: General Surgery

## 2016-06-10 DIAGNOSIS — Z905 Acquired absence of kidney: Secondary | ICD-10-CM | POA: Insufficient documentation

## 2016-06-10 DIAGNOSIS — R112 Nausea with vomiting, unspecified: Secondary | ICD-10-CM

## 2016-06-10 DIAGNOSIS — Z9884 Bariatric surgery status: Secondary | ICD-10-CM | POA: Diagnosis not present

## 2016-06-12 ENCOUNTER — Ambulatory Visit (HOSPITAL_COMMUNITY)
Admission: RE | Admit: 2016-06-12 | Discharge: 2016-06-12 | Disposition: A | Discharge status: Home / Self Care | Payer: BLUE CROSS/BLUE SHIELD | Source: Ambulatory Visit | Attending: Family Medicine | Admitting: Family Medicine

## 2016-06-12 ENCOUNTER — Other Ambulatory Visit (HOSPITAL_COMMUNITY): Payer: Self-pay | Admitting: Family Medicine

## 2016-06-12 DIAGNOSIS — R1011 Right upper quadrant pain: Secondary | ICD-10-CM | POA: Diagnosis not present

## 2016-06-12 DIAGNOSIS — Z905 Acquired absence of kidney: Secondary | ICD-10-CM | POA: Diagnosis not present

## 2016-06-14 ENCOUNTER — Emergency Department (HOSPITAL_COMMUNITY): Payer: BLUE CROSS/BLUE SHIELD

## 2016-06-14 ENCOUNTER — Emergency Department (HOSPITAL_COMMUNITY)
Admission: EM | Admit: 2016-06-14 | Discharge: 2016-06-15 | Disposition: A | Discharge status: Home / Self Care | Payer: BLUE CROSS/BLUE SHIELD | Attending: Emergency Medicine | Admitting: Emergency Medicine

## 2016-06-14 ENCOUNTER — Encounter (HOSPITAL_COMMUNITY): Payer: Self-pay | Admitting: Oncology

## 2016-06-14 DIAGNOSIS — E119 Type 2 diabetes mellitus without complications: Secondary | ICD-10-CM | POA: Diagnosis not present

## 2016-06-14 DIAGNOSIS — E039 Hypothyroidism, unspecified: Secondary | ICD-10-CM | POA: Insufficient documentation

## 2016-06-14 DIAGNOSIS — R140 Abdominal distension (gaseous): Secondary | ICD-10-CM | POA: Diagnosis not present

## 2016-06-14 DIAGNOSIS — R1010 Upper abdominal pain, unspecified: Secondary | ICD-10-CM

## 2016-06-14 LAB — URINALYSIS, ROUTINE W REFLEX MICROSCOPIC
BACTERIA UA: NONE SEEN
BILIRUBIN URINE: NEGATIVE
Glucose, UA: NEGATIVE mg/dL
HGB URINE DIPSTICK: NEGATIVE
Ketones, ur: NEGATIVE mg/dL
LEUKOCYTES UA: NEGATIVE
NITRITE: NEGATIVE
PH: 6 (ref 5.0–8.0)
Protein, ur: 100 mg/dL — AB
SPECIFIC GRAVITY, URINE: 1.017 (ref 1.005–1.030)

## 2016-06-14 LAB — CBC
HEMATOCRIT: 35.6 % — AB (ref 36.0–46.0)
Hemoglobin: 11.8 g/dL — ABNORMAL LOW (ref 12.0–15.0)
MCH: 29.7 pg (ref 26.0–34.0)
MCHC: 33.1 g/dL (ref 30.0–36.0)
MCV: 89.7 fL (ref 78.0–100.0)
Platelets: 305 10*3/uL (ref 150–400)
RBC: 3.97 MIL/uL (ref 3.87–5.11)
RDW: 12.6 % (ref 11.5–15.5)
WBC: 8.1 10*3/uL (ref 4.0–10.5)

## 2016-06-14 LAB — COMPREHENSIVE METABOLIC PANEL
ALT: 13 U/L — ABNORMAL LOW (ref 14–54)
ANION GAP: 7 (ref 5–15)
AST: 15 U/L (ref 15–41)
Albumin: 3.5 g/dL (ref 3.5–5.0)
Alkaline Phosphatase: 52 U/L (ref 38–126)
BILIRUBIN TOTAL: 0.5 mg/dL (ref 0.3–1.2)
BUN: 23 mg/dL — AB (ref 6–20)
CO2: 26 mmol/L (ref 22–32)
Calcium: 9.5 mg/dL (ref 8.9–10.3)
Chloride: 107 mmol/L (ref 101–111)
Creatinine, Ser: 0.91 mg/dL (ref 0.44–1.00)
Glucose, Bld: 93 mg/dL (ref 65–99)
POTASSIUM: 3.8 mmol/L (ref 3.5–5.1)
Sodium: 140 mmol/L (ref 135–145)
TOTAL PROTEIN: 6.9 g/dL (ref 6.5–8.1)

## 2016-06-14 LAB — LIPASE, BLOOD: Lipase: 66 U/L — ABNORMAL HIGH (ref 11–51)

## 2016-06-14 LAB — PREGNANCY, URINE: Preg Test, Ur: NEGATIVE

## 2016-06-14 MED ORDER — SODIUM CHLORIDE 0.9 % IV BOLUS (SEPSIS)
1000.0000 mL | Freq: Once | INTRAVENOUS | Status: AC
Start: 2016-06-14 — End: 2016-06-15
  Administered 2016-06-14: 1000 mL via INTRAVENOUS

## 2016-06-14 MED ORDER — IOPAMIDOL (ISOVUE-300) INJECTION 61%
INTRAVENOUS | Status: AC
  Administered 2016-06-14: 100 mL via INTRAVENOUS
  Filled 2016-06-14: qty 100

## 2016-06-14 NOTE — ED Provider Notes (Signed)
[facility] DEPT Provider Note   CSN: [id number] Arrival date & time: 06/14/16 2038     History    Chief Complaint  Patient presents with  . Abdominal Pain     HPI Alison Fitzgerald is a 36 y.o. female.  36yo F w/ PMH including T2DM, kidney stones s/p R nephrectomy, gastric sleeve surgery who p/w abdominal pain. The patient had a gastric sleeve surgery several months ago and recovered well from the surgery. Approximately one month ago, she began having nausea after meals associated with diaphoresis and occasionally an episode of vomiting. The symptoms would last for a proximally 15-30 minutes after eating. Over the past several weeks, she has had the sensation like food and drink is getting stuck at the bottom of her esophagus after eating. She reports upper abdominal pain that was initially on the right side but then began involving the left side which has felt swollen and bloated. These symptoms always occur after eating. She denies any fevers, urinary symptoms, blood in her stool, diarrhea, or constipation. She had a barium swallow study which was normal and an abdominal ultrasound which was also normal this week. She was told to report to the ED if her symptoms worsened and her pain was worse tonight which is why she presents.  Past Medical History:  Diagnosis Date  . Diabetes mellitus without complication ([place])   . Hypothyroidism   . Injury of left great toe    due to trauma- area required stitches and stitches are out- DR Kinsinger aware per pateint   . Kidney stones   . PONV (postoperative nausea and vomiting)   . Sleep apnea    cpap- setting at 8 per patient   . Staghorn kidney stones    right nephrectomy      Patient Active Problem List   Diagnosis Date Noted  . Hypoglycemia 12/24/2015  . Morbid obesity ([place]) 11/19/2015  . SVT (supraventricular tachycardia) ([place]) 02/18/2015  . PCO (polycystic ovaries) 01/29/2015  . Dyslipidemia 01/29/2015  . Diabetes  mellitus without complication ([place])   . Hypothyroidism   . Staghorn kidney stones     Past Surgical History:  Procedure Laterality Date  . CESAREAN SECTION    . cystocopy      multiple times   . EXCISION OF ENDOMETRIOMA     removed 2009   . LAPAROSCOPIC GASTRIC SLEEVE RESECTION N/A 11/19/2015   Procedure: LAPAROSCOPIC GASTRIC SLEEVE RESECTION, UPPER ENDO;  Surgeon: Arta Bruce Kinsinger, MD;  Location: [facility];  Service: General;  Laterality: N/A;  . NEPHRECTOMY    . PERCUTANEOUS NEPHROSTOMY     multiple times   . URETEROSCOPY     multiple times     OB History    No data available        Home Medications    Prior to Admission medications   Medication Sig Start Date End Date Taking? Authorizing Provider  Calcium Citrate-Vitamin D 500-500 MG-UNIT CHEW Chew 1 capsule by mouth 3 (three) times daily.   Yes Historical Provider, MD  colchicine 0.6 MG tablet Take 1 tablet (0.6 mg total) by mouth 2 (two) times daily. Patient taking differently: Take 0.6 mg by mouth 2 (two) times daily as needed. For gout flare-ups. 03/22/15  Yes Rolland Porter, MD  Cyanocobalamin (VITAMIN B-12) 1000 MCG SUBL Place 1,000 mcg under the tongue daily.   Yes Historical Provider, MD  Multiple Vitamins-Minerals (MULTIVITAMIN WITH MINERALS) tablet Take 2 tablets by mouth daily.   Yes Historical Provider, MD  BAYER MICROLET LANCETS lancets Use to check blood sugar 1 time per day. 04/12/15   Renato Shin, MD  glucose blood (BAYER CONTOUR NEXT TEST) test strip Use to check blood sugar 1 time per day. 04/12/15   Renato Shin, MD      Family History  Problem Relation Age of Onset  . Ovarian cancer Paternal Grandmother   . Diabetes Maternal Grandmother   . Diabetes Mother      Social History  Substance Use Topics  . Smoking status: Never Smoker  . Smokeless tobacco: Never Used  . Alcohol use No     Allergies     Doxycycline; Toradol [ketorolac tromethamine]; Food; Ibuprofen; Indomethacin; Nsaids; Other;  and Prednisone    Review of Systems  10 Systems reviewed and are negative for acute change except as noted in the HPI.   Physical Exam Updated Vital Signs BP 112/65 (BP Location: Left Arm)   Pulse 70   Temp 98 F (36.7 C) (Oral)   Resp 18   Ht 5\' 4"  (1.626 m)   Wt 189 lb (85.7 kg)   LMP 06/10/2016   SpO2 97%   BMI 32.44 kg/m   Physical Exam  Constitutional: She is oriented to person, place, and time. She appears well-developed and well-nourished. No distress.  HENT:  Head: Normocephalic and atraumatic.  Moist mucous membranes  Eyes: Conjunctivae are normal. Pupils are equal, round, and reactive to light.  Neck: Neck supple.  Cardiovascular: Normal rate, regular rhythm and normal heart sounds.   No murmur heard. Pulmonary/Chest: Effort normal and breath sounds normal.  Abdominal: Soft. Bowel sounds are normal. She exhibits no distension. There is tenderness. There is no rebound and no guarding.  LUQ, midepigastric, and mild RUQ tenderness  Musculoskeletal: She exhibits no edema.  Neurological: She is alert and oriented to person, place, and time.  Fluent speech  Skin: Skin is warm and dry.  Psychiatric: She has a normal mood and affect. Judgment normal.  Nursing note and vitals reviewed.     ED Treatments / Results  Labs (all labs ordered are listed, but only abnormal results are displayed) Labs Reviewed  LIPASE, BLOOD - Abnormal; Notable for the following:       Result Value   Lipase 66 (*)    All other components within normal limits  COMPREHENSIVE METABOLIC PANEL - Abnormal; Notable for the following:    BUN 23 (*)    ALT 13 (*)    All other components within normal limits  CBC - Abnormal; Notable for the following:    Hemoglobin 11.8 (*)    HCT 35.6 (*)    All other components within normal limits  URINALYSIS, ROUTINE W REFLEX MICROSCOPIC - Abnormal; Notable for the following:    Protein, ur 100 (*)    Squamous Epithelial / LPF 0-5 (*)    All other  components within normal limits  PREGNANCY, URINE     EKG  EKG Interpretation  Date/Time:    Ventricular Rate:    PR Interval:    QRS Duration:   QT Interval:    QTC Calculation:   R Axis:     Text Interpretation:           Radiology Ct Abdomen Pelvis W Contrast  Result Date: 06/14/2016 CLINICAL DATA:  36 year old female with left upper quadrant abdominal pain. History of gastric sleeve in August 2017 and intermittent abdominal pain. EXAM: CT ABDOMEN AND PELVIS WITH CONTRAST TECHNIQUE: Multidetector CT imaging of the abdomen and  pelvis was performed using the standard protocol following bolus administration of intravenous contrast. CONTRAST:  1 ISOVUE-300 IOPAMIDOL (ISOVUE-300) INJECTION 61% COMPARISON:  Abdominal CT dated 09/29/2015 FINDINGS: Lower chest: The visualized lung bases are clear. No intra-abdominal free air or free fluid. Hepatobiliary: No focal liver abnormality is seen. No gallstones, gallbladder wall thickening, or biliary dilatation. Pancreas: Unremarkable. No pancreatic ductal dilatation or surrounding inflammatory changes. Spleen: Normal in size without focal abnormality. Adrenals/Urinary Tract: The adrenal glands appear unremarkable. There is stable area of atrophy involving the inferior pole of the left kidney, likely related to chronic or recurrent infection and infarct. Punctate nonobstructing left renal interpolar stone as well as probable small nonobstructing inferior pole calculus. There is no hydronephrosis on the left. There is postsurgical changes of right nephrectomy. A stable 5.0 x 3.3 cm cystic structure noted at the nephrectomy bed similar to prior CTs. The visualized left ureter and urinary bladder appears unremarkable. Stomach/Bowel: There is postsurgical changes of gastric sleeve. There is no evidence of bowel obstruction or active inflammation. Moderate stool noted within the colon. Normal appendix. Vascular/Lymphatic: The abdominal aorta and IVC appear  unremarkable. The origins of the celiac axis, SMA, IMA are patent. No portal venous gas identified. There is no adenopathy. Reproductive: The uterus is anteverted. Small subcentimeter foci of myometrial enhancement noted which may represent small fibroids. The ovaries appear unremarkable. Other: Small fat containing umbilical hernia. Musculoskeletal: There is degenerative changes of the spine. No acute fracture. IMPRESSION: 1. No acute intra-abdominopelvic pathology. Postsurgical changes of gastric sleeve. No evidence of bowel obstruction or active inflammation. Normal appendix. 2. Status post prior right nephrectomy with a stable cystic structure in the right nephrectomy bed. 3. Left renal inferior pole atrophy, are likely related to chronic infection and scarring. Probable punctate nonobstructing left renal calculi. No hydronephrosis. Electronically Signed   By: Anner Crete M.D.   On: 06/14/2016 23:52    Procedures Procedures (including critical care time) Procedures  Medications Ordered in ED  Medications  iopamidol (ISOVUE-300) 61 % injection (100 mLs Intravenous Contrast Given 06/14/16 2319)  sodium chloride 0.9 % bolus 1,000 mL (0 mLs Intravenous Stopped 06/15/16 0120)     Initial Impression / Assessment and Plan / ED Course  I have reviewed the triage vital signs and the nursing notes.  Pertinent labs & imaging results that were available during my care of the patient were reviewed by me and considered in my medical decision making (see chart for details).     Pt w/ several weeks of nausea and upper abdominal pain after eating, gastric sleeve surgery several months ago. She was nontoxic on exam with normal vital signs. She had mild abdominal tenderness across upper abdomen with no distention or peritonitis. Gave IV fluid bolus and obtained above lab work which was unremarkable. Total WBC count, normal LFTs and bilirubin. Lipase 66. No evidence of dehydration. I discussed these  findings with the patient and discussed options including follow-up with general surgery for further outpatient testing versus CT scan to rule out postsurgical complication, infection, or obstruction although I feel these are less likely given the chronicity of her symptoms. I reviewed the risks of CT scan including radiation exposure. Patient voiced understanding of options and wanted to proceed with CT scan. CT showed no acute findings to explain the patient's symptoms. She has been able to tolerate liquids here with no problems. Given that she is well-hydrated with normal vital signs and reassuring lab work, thus she is safe for discharge. I've  instructed to contact general surgery for follow-up this week for further evaluation. Return precautions reviewed. Patient voiced understanding and was discharged in satisfactory condition.  Final Clinical Impressions(s) / ED Diagnoses   Final diagnoses:  Upper abdominal pain  Bloating     New Prescriptions   No medications on file       Sharlett Iles, MD 06/15/16 0134

## 2016-06-14 NOTE — ED Triage Notes (Signed)
Pt c/o LUQ pain since last Tuesday.  Pt had gastric sleeve in august of 2017 and has had intermittent problems w/ abdominal pain.  She has been seen for this w/ additional testing w/ no acute findings.  Pt reports last lipase was 92.  Rates pain 10/10, pressure in nature.  +nauea, bloating and gas.

## 2016-06-14 NOTE — ED Notes (Signed)
Patient transported to CT 

## 2016-06-14 NOTE — ED Notes (Signed)
IV access attempted x 2 with no success. Labs were able to be obtained.

## 2016-06-15 ENCOUNTER — Other Ambulatory Visit: Payer: Self-pay | Admitting: Family Medicine

## 2016-06-15 DIAGNOSIS — R1011 Right upper quadrant pain: Secondary | ICD-10-CM

## 2016-06-15 NOTE — ED Notes (Signed)
Pt given water to evaluate toleration of PO fluids.

## 2016-07-09 ENCOUNTER — Encounter (HOSPITAL_COMMUNITY): Payer: Self-pay | Admitting: *Deleted

## 2016-07-10 ENCOUNTER — Encounter (HOSPITAL_COMMUNITY): Payer: Self-pay | Admitting: *Deleted

## 2016-07-10 ENCOUNTER — Encounter (HOSPITAL_COMMUNITY)
Admission: RE | Disposition: A | Discharge status: Home / Self Care | Payer: Self-pay | Source: Ambulatory Visit | Attending: General Surgery

## 2016-07-10 ENCOUNTER — Ambulatory Visit (HOSPITAL_COMMUNITY): Payer: BLUE CROSS/BLUE SHIELD | Admitting: Anesthesiology

## 2016-07-10 ENCOUNTER — Ambulatory Visit (HOSPITAL_COMMUNITY)
Admission: RE | Admit: 2016-07-10 | Discharge: 2016-07-10 | Disposition: A | Discharge status: Home / Self Care | Payer: BLUE CROSS/BLUE SHIELD | Source: Ambulatory Visit | Attending: General Surgery | Admitting: General Surgery

## 2016-07-10 DIAGNOSIS — E039 Hypothyroidism, unspecified: Secondary | ICD-10-CM | POA: Diagnosis not present

## 2016-07-10 DIAGNOSIS — Z888 Allergy status to other drugs, medicaments and biological substances status: Secondary | ICD-10-CM | POA: Insufficient documentation

## 2016-07-10 DIAGNOSIS — Z905 Acquired absence of kidney: Secondary | ICD-10-CM | POA: Insufficient documentation

## 2016-07-10 DIAGNOSIS — Z881 Allergy status to other antibiotic agents status: Secondary | ICD-10-CM | POA: Diagnosis not present

## 2016-07-10 DIAGNOSIS — K30 Functional dyspepsia: Secondary | ICD-10-CM | POA: Diagnosis present

## 2016-07-10 DIAGNOSIS — Z87442 Personal history of urinary calculi: Secondary | ICD-10-CM | POA: Diagnosis not present

## 2016-07-10 DIAGNOSIS — K2950 Unspecified chronic gastritis without bleeding: Secondary | ICD-10-CM | POA: Diagnosis not present

## 2016-07-10 DIAGNOSIS — B9681 Helicobacter pylori [H. pylori] as the cause of diseases classified elsewhere: Secondary | ICD-10-CM | POA: Diagnosis not present

## 2016-07-10 DIAGNOSIS — E119 Type 2 diabetes mellitus without complications: Secondary | ICD-10-CM | POA: Diagnosis not present

## 2016-07-10 DIAGNOSIS — Z9884 Bariatric surgery status: Secondary | ICD-10-CM | POA: Diagnosis not present

## 2016-07-10 DIAGNOSIS — M109 Gout, unspecified: Secondary | ICD-10-CM | POA: Insufficient documentation

## 2016-07-10 DIAGNOSIS — Z886 Allergy status to analgesic agent status: Secondary | ICD-10-CM | POA: Diagnosis not present

## 2016-07-10 HISTORY — DX: Fatty (change of) liver, not elsewhere classified: K76.0

## 2016-07-10 HISTORY — DX: Gout, unspecified: M10.9

## 2016-07-10 HISTORY — DX: Anemia, unspecified: D64.9

## 2016-07-10 HISTORY — PX: ESOPHAGOGASTRODUODENOSCOPY (EGD) WITH PROPOFOL: SHX5813

## 2016-07-10 SURGERY — ESOPHAGOGASTRODUODENOSCOPY (EGD) WITH PROPOFOL
Anesthesia: Monitor Anesthesia Care

## 2016-07-10 MED ORDER — PROPOFOL 10 MG/ML IV BOLUS
INTRAVENOUS | Status: AC
  Filled 2016-07-10: qty 20

## 2016-07-10 MED ORDER — LACTATED RINGERS IV SOLN
INTRAVENOUS | Status: DC
  Administered 2016-07-10: 12:00:00 via INTRAVENOUS

## 2016-07-10 MED ORDER — PROPOFOL 10 MG/ML IV BOLUS
INTRAVENOUS | Status: DC | PRN
  Administered 2016-07-10: 20 mg via INTRAVENOUS
  Administered 2016-07-10 (×2): 10 mg via INTRAVENOUS
  Administered 2016-07-10 (×3): 20 mg via INTRAVENOUS
  Administered 2016-07-10: 40 mg via INTRAVENOUS
  Administered 2016-07-10: 20 mg via INTRAVENOUS
  Administered 2016-07-10: 10 mg via INTRAVENOUS
  Administered 2016-07-10: 20 mg via INTRAVENOUS

## 2016-07-10 MED ORDER — SODIUM CHLORIDE 0.9 % IV SOLN: INTRAVENOUS | Status: DC

## 2016-07-10 NOTE — Op Note (Signed)
[facility] Patient Name: Alison Fitzgerald Procedure Date: 07/10/2016 MRN: [id number] Attending MD: Mickeal Skinner MD, MD Date of Birth: [birth year] CSN: [id number] Age: 36 Admit Type: Outpatient Procedure:                Upper GI endoscopy Indications:              Functional Dyspepsia, Heartburn Providers:                Arta Bruce Kinsinger MD, MD, Laverta Baltimore RN,                            RN, Alfonso Patten, Technician, Craig Alday CRNA,                            CRNA Referring MD:              Medicines:                Propofol per Anesthesia Complications:            No immediate complications. Estimated blood loss:                            Minimal. Estimated Blood Loss:     Estimated blood loss was minimal. Procedure:                Pre-Anesthesia Assessment:                           - Prior to the procedure, a History and Physical                            was performed, and patient medications and                            allergies were reviewed. The patient is competent.                            The risks and benefits of the procedure and the                            sedation options and risks were discussed with the                            patient. All questions were answered and informed                            consent was obtained. Patient identification and                            proposed procedure were verified by the physician                            in the endoscopy suite. Mental Status Examination:                            alert  and oriented. Airway Examination: normal                            oropharyngeal airway and neck mobility. Respiratory                            Examination: clear to auscultation. CV Examination:                            normal. ASA Grade Assessment: II - A patient with                            mild systemic disease. After reviewing the risks                            and  benefits, the patient was deemed in                            satisfactory condition to undergo the procedure.                            The anesthesia plan was to use deep sedation /                            analgesia. Immediately prior to administration of                            medications, the patient was re-assessed for                            adequacy to receive sedatives. The heart rate,                            respiratory rate, oxygen saturations, blood                            pressure, adequacy of pulmonary ventilation, and                            response to care were monitored throughout the                            procedure. The physical status of the patient was                            re-assessed after the procedure.                           After obtaining informed consent, the endoscope was                            passed under direct vision. Throughout the  procedure, the patient's blood pressure, pulse, and                            oxygen saturations were monitored continuously. The                            EG-[id number] ([id number]) scope was introduced through the                            mouth, and advanced to the duodenal bulb. The upper                            GI endoscopy was accomplished with ease. The                            patient tolerated the procedure well. Scope In: Scope Out: Findings:      The examined esophagus was normal.      Localized mild inflammation characterized by erythema was found in the       gastric antrum. Biopsies were taken with a cold forceps for Helicobacter       pylori testing.      The in the duodenum was normal. Impression:               - Normal esophagus.                           - Gastritis. Biopsied.                           - Normal. Moderate Sedation:      Moderate (conscious) sedation was personally administered by an       anesthesia professional. The following  parameters were monitored: oxygen       saturation, heart rate, blood pressure, and response to care. Total       physician intraservice time was 5 minutes. Recommendation:           - Follow an antireflux regimen. Procedure Code(s):        --- Professional ---                           [phone], Esophagogastroduodenoscopy, flexible,                            transoral; with biopsy, single or multiple Diagnosis Code(s):        --- Professional ---                           K29.70, Gastritis, unspecified, without bleeding                           K30, Functional dyspepsia                           R12, Heartburn CPT copyright 2016 American Medical Association. All rights reserved. The codes documented in this report are preliminary and upon coder review may  be revised to meet current compliance requirements.  Gurney Maxin, MD Lurena Joiner  Sondra Come MD, MD 07/10/2016 1:37:32 PM This report has been signed electronically. Number of Addenda: 0

## 2016-07-10 NOTE — H&P (Signed)
Alison Fitzgerald is an 36 y.o. female.   Chief Complaint: nausea HPI: 36 yo female with nausea and vomiting after sleeve gastrectomy. She has tried multiple other treated and underwent imaging with no abnormalities identified.  Past Medical History:  Diagnosis Date  . Anemia    only with pregnancy  . Diabetes mellitus without complication ([place])    type 2 diet controlled  . Fatty liver disease, nonalcoholic    improving with weight loss  . Gout   . Hypothyroidism   . Injury of left great toe    due to trauma- area required stitches and stitches are out- DR Kinsinger aware per pateint   . Kidney stones   . PONV (postoperative nausea and vomiting)   . Stag[place] kidney stones    right nephrectomy     Past Surgical History:  Procedure Laterality Date  . CESAREAN SECTION     x 1  . cystocopy      multiple times   . EXCISION OF ENDOMETRIOMA     removed 2009   . LAPAROSCOPIC GASTRIC SLEEVE RESECTION N/A 11/19/2015   Procedure: LAPAROSCOPIC GASTRIC SLEEVE RESECTION, UPPER ENDO;  Surgeon: Arta Bruce Kinsinger, MD;  Location: [facility];  Service: General;  Laterality: N/A;  . NEPHRECTOMY Right    right 1991 bottob 1999 top half  . PERCUTANEOUS NEPHROSTOMY     multiple times   . TUBAL LIGATION    . URETEROSCOPY     multiple times     Family History  Problem Relation Age of Onset  . Ovarian cancer Paternal Grandmother   . Diabetes Maternal Grandmother   . Diabetes Mother    Social History:  reports that she has never smoked. She has never used smokeless tobacco. She reports that she does not drink alcohol or use drugs.  Allergies:  Allergies  Allergen Reactions  . Doxycycline Anaphylaxis  . Food Anaphylaxis    Pt is allergic to jalapeno seeds.   . Toradol [Ketorolac Tromethamine] Hives  . Ibuprofen Other (See Comments)    Pt is unable to take due to bariatric surgery.    . Indomethacin Other (See Comments)    Pt states that this med caused damage to her kidneys.   .  Nsaids Other (See Comments)    Pt is unable to take due to bariatric surgery.    . Prednisone Swelling and Other (See Comments)    Reaction:  Facial swelling   . Thiola [Tiopronin] Other (See Comments)    Reaction:  Bottomed out pts BP    Medications Prior to Admission  Medication Sig Dispense Refill  . Calcium Carbonate-Vitamin D (CALCIUM 600+D) 600-400 MG-UNIT tablet Take 1 tablet by mouth 3 (three) times daily.    . colchicine 0.6 MG tablet Take 1 tablet (0.6 mg total) by mouth 2 (two) times daily. 30 tablet 0  . Cyanocobalamin (VITAMIN B-12) 1000 MCG SUBL Place 1,000 mcg under the tongue daily.    Marland Kitchen levothyroxine (SYNTHROID, LEVOTHROID) 112 MCG tablet Take 224 mcg by mouth daily before breakfast.    . LORazepam (ATIVAN) 1 MG tablet Take 1 mg by mouth every 6 (six) hours as needed (for nausea).    . Multiple Vitamins-Minerals (MULTIVITAMIN WITH MINERALS) tablet Take 1 tablet by mouth 2 (two) times daily.       No results found for this or any previous visit (from the past 48 hour(s)). No results found.  Review of Systems  Constitutional: Negative for chills and fever.  HENT: Negative for  hearing loss.   Eyes: Negative for blurred vision and double vision.  Respiratory: Negative for cough and hemoptysis.   Cardiovascular: Negative for chest pain and palpitations.  Gastrointestinal: Positive for nausea and vomiting. Negative for abdominal pain.  Genitourinary: Negative for dysuria and urgency.  Musculoskeletal: Negative for myalgias and neck pain.  Skin: Negative for itching and rash.  Neurological: Negative for dizziness, tingling and headaches.  Endo/Heme/Allergies: Does not bruise/bleed easily.  Psychiatric/Behavioral: Negative for depression and suicidal ideas.    Blood pressure 100/68, pulse 71, temperature 98.3 F (36.8 C), temperature source Oral, resp. rate 16, height 5\' 4"  (1.626 m), weight 85.7 kg (189 lb), last menstrual period 06/10/2016, SpO2 100 %. Physical Exam   Vitals reviewed. Constitutional: She is oriented to person, place, and time. She appears well-developed and well-nourished.  HENT:  Head: Normocephalic and atraumatic.  Eyes: Conjunctivae and EOM are normal. Pupils are equal, round, and reactive to light.  Neck: Normal range of motion. Neck supple.  Cardiovascular: Normal rate and regular rhythm.   Respiratory: Effort normal and breath sounds normal.  GI: Soft. Bowel sounds are normal. She exhibits no distension. There is no tenderness.  Musculoskeletal: Normal range of motion.  Neurological: She is alert and oriented to person, place, and time.  Skin: Skin is warm and dry.  Psychiatric: She has a normal mood and affect. Her behavior is normal.     Assessment/Plan 36 yo female with persistent nausea and vomiting after sleeve gastrectomy -upper endoscopy  Mickeal Skinner, MD 07/10/2016, 1:10 PM

## 2016-07-10 NOTE — Transfer of Care (Signed)
Immediate Anesthesia Transfer of Care Note  Patient: Alison Fitzgerald  Procedure(s) Performed: Procedure(s): ESOPHAGOGASTRODUODENOSCOPY (EGD) WITH PROPOFOL (N/A)  Patient Location: PACU  Anesthesia Type:MAC  Level of Consciousness: sedated  Airway & Oxygen Therapy: Patient Spontanous Breathing and Patient connected to nasal cannula oxygen  Post-op Assessment: Report given to RN and Post -op Vital signs reviewed and stable  Post vital signs: Reviewed and stable  Last Vitals:  Vitals:   07/10/16 1200  BP: 100/68  Pulse: 71  Resp: 16  Temp: 36.8 C    Last Pain:  Vitals:   07/10/16 1200  TempSrc: Oral      Patients Stated Pain Goal: 3 (51/70/01 [id number])  Complications: No apparent anesthesia complications

## 2016-07-10 NOTE — Anesthesia Preprocedure Evaluation (Signed)
Anesthesia Evaluation  Patient identified by MRN, date of birth, ID band Patient awake    Reviewed: Allergy & Precautions, H&P , Patient's Chart, lab work & pertinent test results, reviewed documented beta blocker date and time   Airway Mallampati: II  TM Distance: >3 FB Neck ROM: full    Dental no notable dental hx.    Pulmonary    Pulmonary exam normal breath sounds clear to auscultation       Cardiovascular  Rhythm:regular Rate:Normal     Neuro/Psych    GI/Hepatic   Endo/Other  diabetes, Type 2  Renal/GU      Musculoskeletal   Abdominal   Peds  Hematology   Anesthesia Other Findings   Reproductive/Obstetrics                             Anesthesia Physical Anesthesia Plan  ASA: II  Anesthesia Plan: MAC   Post-op Pain Management:    Induction: Intravenous  Airway Management Planned: Mask and Natural Airway  Additional Equipment:   Intra-op Plan:   Post-operative Plan:   Informed Consent: I have reviewed the patients History and Physical, chart, labs and discussed the procedure including the risks, benefits and alternatives for the proposed anesthesia with the patient or authorized representative who has indicated his/her understanding and acceptance.   Dental Advisory Given  Plan Discussed with: CRNA and Surgeon  Anesthesia Plan Comments:         Anesthesia Quick Evaluation

## 2016-07-10 NOTE — Discharge Instructions (Signed)
Esophagogastroduodenoscopy, Care After Refer to this sheet in the next few weeks. These instructions provide you with information about caring for yourself after your procedure. Your health care provider may also give you more specific instructions. Your treatment has been planned according to current medical practices, but problems sometimes occur. Call your health care provider if you have any problems or questions after your procedure. What can I expect after the procedure? After the procedure, it is common to have:  A sore throat.  Nausea.  Bloating.  Dizziness.  Fatigue. Follow these instructions at home:  Do not eat or drink anything until the numbing medicine (local anesthetic) has worn off and your gag reflex has returned. You will know that the local anesthetic has worn off when you can swallow comfortably.  Do not drive for 24 hours if you received a medicine to help you relax (sedative).  If your health care provider took a tissue sample for testing during the procedure, make sure to get your test results. This is your responsibility. Ask your health care provider or the department performing the test when your results will be ready.  Keep all follow-up visits as told by your health care provider. This is important. Contact a health care provider if:  You cannot stop coughing.  You are not urinating.  You are urinating less than usual. Get help right away if:  You have trouble swallowing.  You cannot eat or drink.  You have throat or chest pain that gets worse.  You are dizzy or light-headed.  You faint.  You have nausea or vomiting.  You have chills.  You have a fever.  You have severe abdominal pain.  You have black, tarry, or bloody stools. This information is not intended to replace advice given to you by your health care provider. Make sure you discuss any questions you have with your health care provider. Document Released: 03/09/2012 Document  Revised: 08/29/2015 Document Reviewed: 02/14/2015 Elsevier Interactive Patient Education  2017 [place]. Esophagogastroduodenoscopy Esophagogastroduodenoscopy (EGD) is a procedure to examine the lining of the esophagus, stomach, and first part of the small intestine (duodenum). This procedure is done to check for problems such as inflammation, bleeding, ulcers, or growths. During this procedure, a long, flexible, lighted tube with a camera attached (endoscope) is inserted down the throat. Tell a health care provider about:  Any allergies you have.  All medicines you are taking, including vitamins, herbs, eye drops, creams, and over-the-counter medicines.  Any problems you or family members have had with anesthetic medicines.  Any blood disorders you have.  Any surgeries you have had.  Any medical conditions you have.  Whether you are pregnant or may be pregnant. What are the risks? Generally, this is a safe procedure. However, problems may occur, including:  Infection.  Bleeding.  A tear (perforation) in the esophagus, stomach, or duodenum.  Trouble breathing.  Excessive sweating.  Spasms of the larynx.  A slowed heartbeat.  Low blood pressure. What happens before the procedure?  Follow instructions from your health care provider about eating or drinking restrictions.  Ask your health care provider about:  Changing or stopping your regular medicines. This is especially important if you are taking diabetes medicines or blood thinners.  Taking medicines such as aspirin and ibuprofen. These medicines can thin your blood. Do not take these medicines before your procedure if your health care provider instructs you not to.  Plan to have someone take you home after the procedure.  If  you wear dentures, be ready to remove them before the procedure. What happens during the procedure?  To reduce your risk of infection, your health care team will wash or sanitize their  hands.  An IV tube will be put in a vein in your hand or arm. You will get medicines and fluids through this tube.  You will be given one or more of the following:  A medicine to help you relax (sedative).  A medicine to numb the area (local anesthetic). This medicine may be sprayed into your throat. It will make you feel more comfortable and keep you from gagging or coughing during the procedure.  A medicine for pain.  A mouth guard may be placed in your mouth to protect your teeth and to keep you from biting on the endoscope.  You will be asked to lie on your left side.  The endoscope will be lowered down your throat into your esophagus, stomach, and duodenum.  Air will be put into the endoscope. This will help your health care provider see better.  The lining of your esophagus, stomach, and duodenum will be examined.  Your health care provider may:  Take a tissue sample so it can be looked at in a lab (biopsy).  Remove growths.  Remove objects (foreign bodies) that are stuck.  Treat any bleeding with medicines or other devices that stop tissue from bleeding.  Widen (dilate) or stretch narrowed areas of your esophagus and stomach.  The endoscope will be taken out. The procedure may vary among health care providers and hospitals. What happens after the procedure?  Your blood pressure, heart rate, breathing rate, and blood oxygen level will be monitored often until the medicines you were given have worn off.  Do not eat or drink anything until the numbing medicine has worn off and your gag reflex has returned. This information is not intended to replace advice given to you by your health care provider. Make sure you discuss any questions you have with your health care provider. Document Released: 07/24/2004 Document Revised: 08/29/2015 Document Reviewed: 02/14/2015 Elsevier Interactive Patient Education  2017 [employer].

## 2016-07-10 NOTE — Anesthesia Postprocedure Evaluation (Signed)
Anesthesia Post Note  Patient: Alison Fitzgerald  Procedure(s) Performed: Procedure(s) (LRB): ESOPHAGOGASTRODUODENOSCOPY (EGD) WITH PROPOFOL (N/A)  Patient location during evaluation: PACU Anesthesia Type: MAC Level of consciousness: awake and alert Pain management: pain level controlled Vital Signs Assessment: post-procedure vital signs reviewed and stable Respiratory status: spontaneous breathing, nonlabored ventilation, respiratory function stable and patient connected to nasal cannula oxygen Cardiovascular status: stable and blood pressure returned to baseline Anesthetic complications: no       Last Vitals:  Vitals:   07/10/16 1350 07/10/16 1400  BP: 106/68 102/61  Pulse: 67 72  Resp: 13 14  Temp:      Last Pain:  Vitals:   07/10/16 1331  TempSrc: Oral                 Elayjah Chaney EDWARD

## 2016-07-13 ENCOUNTER — Telehealth: Payer: Self-pay | Admitting: Endocrinology

## 2016-07-13 NOTE — Telephone Encounter (Signed)
synthyroid called to [facility] please in [place]

## 2016-07-14 ENCOUNTER — Encounter (HOSPITAL_COMMUNITY): Payer: Self-pay | Admitting: General Surgery

## 2016-07-14 ENCOUNTER — Other Ambulatory Visit: Payer: Self-pay | Admitting: Endocrinology

## 2016-07-14 MED ORDER — LEVOTHYROXINE SODIUM 112 MCG PO TABS: 224.0000 ug | ORAL_TABLET | Freq: Every day | ORAL | 2 refills | Status: DC

## 2016-07-14 NOTE — Telephone Encounter (Signed)
Refill submitted per patient's request.  

## 2016-07-22 ENCOUNTER — Emergency Department (HOSPITAL_COMMUNITY): Payer: BLUE CROSS/BLUE SHIELD

## 2016-07-22 ENCOUNTER — Encounter (HOSPITAL_COMMUNITY): Payer: Self-pay

## 2016-07-22 ENCOUNTER — Emergency Department (HOSPITAL_COMMUNITY)
Admission: EM | Admit: 2016-07-22 | Discharge: 2016-07-23 | Disposition: A | Discharge status: Home / Self Care | Payer: BLUE CROSS/BLUE SHIELD | Attending: Emergency Medicine | Admitting: Emergency Medicine

## 2016-07-22 ENCOUNTER — Other Ambulatory Visit: Payer: Self-pay

## 2016-07-22 DIAGNOSIS — E039 Hypothyroidism, unspecified: Secondary | ICD-10-CM | POA: Insufficient documentation

## 2016-07-22 DIAGNOSIS — Z79899 Other long term (current) drug therapy: Secondary | ICD-10-CM | POA: Diagnosis not present

## 2016-07-22 DIAGNOSIS — R55 Syncope and collapse: Secondary | ICD-10-CM | POA: Insufficient documentation

## 2016-07-22 DIAGNOSIS — E119 Type 2 diabetes mellitus without complications: Secondary | ICD-10-CM | POA: Diagnosis not present

## 2016-07-22 LAB — CBC WITH DIFFERENTIAL/PLATELET
BASOS ABS: 0 10*3/uL (ref 0.0–0.1)
BASOS PCT: 0 %
Eosinophils Absolute: 0.5 10*3/uL (ref 0.0–0.7)
Eosinophils Relative: 6 %
HEMATOCRIT: 36 % (ref 36.0–46.0)
HEMOGLOBIN: 12 g/dL (ref 12.0–15.0)
LYMPHS PCT: 35 %
Lymphs Abs: 2.7 10*3/uL (ref 0.7–4.0)
MCH: 29.8 pg (ref 26.0–34.0)
MCHC: 33.3 g/dL (ref 30.0–36.0)
MCV: 89.3 fL (ref 78.0–100.0)
Monocytes Absolute: 0.3 10*3/uL (ref 0.1–1.0)
Monocytes Relative: 4 %
NEUTROS ABS: 4.2 10*3/uL (ref 1.7–7.7)
NEUTROS PCT: 55 %
Platelets: 304 10*3/uL (ref 150–400)
RBC: 4.03 MIL/uL (ref 3.87–5.11)
RDW: 12.8 % (ref 11.5–15.5)
WBC: 7.7 10*3/uL (ref 4.0–10.5)

## 2016-07-22 LAB — COMPREHENSIVE METABOLIC PANEL
ALBUMIN: 3.5 g/dL (ref 3.5–5.0)
ALK PHOS: 56 U/L (ref 38–126)
ALT: 13 U/L — AB (ref 14–54)
AST: 14 U/L — AB (ref 15–41)
Anion gap: 9 (ref 5–15)
BUN: 26 mg/dL — ABNORMAL HIGH (ref 6–20)
CALCIUM: 9 mg/dL (ref 8.9–10.3)
CHLORIDE: 103 mmol/L (ref 101–111)
CO2: 25 mmol/L (ref 22–32)
CREATININE: 1.09 mg/dL — AB (ref 0.44–1.00)
GFR calc Af Amer: >60 mL/min (ref >60)
GFR calc non Af Amer: >60 mL/min (ref >60)
GLUCOSE: 108 mg/dL — AB (ref 65–99)
Potassium: 3.6 mmol/L (ref 3.5–5.1)
SODIUM: 137 mmol/L (ref 135–145)
Total Bilirubin: 0.2 mg/dL — ABNORMAL LOW (ref 0.3–1.2)
Total Protein: 6.5 g/dL (ref 6.5–8.1)

## 2016-07-22 LAB — I-STAT CHEM 8, ED
BUN: 28 mg/dL — AB (ref 6–20)
Calcium, Ion: 1.1 mmol/L — ABNORMAL LOW (ref 1.15–1.40)
Chloride: 104 mmol/L (ref 101–111)
Creatinine, Ser: 1.1 mg/dL — ABNORMAL HIGH (ref 0.44–1.00)
GLUCOSE: 106 mg/dL — AB (ref 65–99)
HEMATOCRIT: 35 % — AB (ref 36.0–46.0)
HEMOGLOBIN: 11.9 g/dL — AB (ref 12.0–15.0)
Potassium: 3.6 mmol/L (ref 3.5–5.1)
Sodium: 139 mmol/L (ref 135–145)
TCO2: 25 mmol/L (ref 0–100)

## 2016-07-22 LAB — I-STAT BETA HCG BLOOD, ED (MC, WL, AP ONLY): I-stat hCG, quantitative: <5 m[IU]/mL (ref <5)

## 2016-07-22 LAB — PROTIME-INR
INR: 0.97
Prothrombin Time: 12.9 seconds (ref 11.4–15.2)

## 2016-07-22 LAB — I-STAT TROPONIN, ED: TROPONIN I, POC: 0 ng/mL (ref 0.00–0.08)

## 2016-07-22 MED ORDER — IOPAMIDOL (ISOVUE-370) INJECTION 76%
INTRAVENOUS | Status: AC
  Administered 2016-07-22: 100 mL
  Filled 2016-07-22: qty 100

## 2016-07-22 MED ORDER — SODIUM CHLORIDE 0.9 % IV BOLUS (SEPSIS)
1000.0000 mL | Freq: Once | INTRAVENOUS | Status: AC
  Administered 2016-07-22: 1000 mL via INTRAVENOUS

## 2016-07-22 NOTE — ED Notes (Signed)
Patient transported to CT 

## 2016-07-22 NOTE — ED Provider Notes (Signed)
[facility] DEPT Provider Note   CSN: [id number] Arrival date & time: 07/22/16  2104     History   Chief Complaint Chief Complaint  Patient presents with  . Loss of Consciousness    HPI Alison Fitzgerald is a 36 y.o. female.  The history is provided by the patient.  Loss of Consciousness   This is a new problem. The current episode started less than 1 hour ago. The problem occurs constantly. The problem has been resolved. She lost consciousness for a period of 1 to 5 minutes. The problem is associated with normal activity. Associated symptoms include chest pain, diaphoresis, light-headedness and nausea. Pertinent negatives include abdominal pain, back pain, bladder incontinence, bowel incontinence, clumsiness, confusion, congestion, dizziness, fever, focal sensory loss, focal weakness, headaches, malaise/fatigue, palpitations, seizures, slurred speech, visual change, vomiting and weakness. She has tried nothing for the symptoms. The treatment provided no relief. Her past medical history is significant for DM. Her past medical history does not include seizures.    Past Medical History:  Diagnosis Date  . Anemia    only with pregnancy  . Diabetes mellitus without complication ([place])    type 2 diet controlled  . Fatty liver disease, nonalcoholic    improving with weight loss  . Gout   . Hypothyroidism   . Injury of left great toe    due to trauma- area required stitches and stitches are out- DR Kinsinger aware per pateint   . Kidney stones   . PONV (postoperative nausea and vomiting)   . Staghorn kidney stones    right nephrectomy     Patient Active Problem List   Diagnosis Date Noted  . Hypoglycemia 12/24/2015  . Morbid obesity ([place]) 11/19/2015  . SVT (supraventricular tachycardia) ([place]) 02/18/2015  . PCO (polycystic ovaries) 01/29/2015  . Dyslipidemia 01/29/2015  . Diabetes mellitus without complication ([place])   . Hypothyroidism   . Staghorn kidney stones      Past Surgical History:  Procedure Laterality Date  . CESAREAN SECTION     x 1  . cystocopy      multiple times   . ESOPHAGOGASTRODUODENOSCOPY (EGD) WITH PROPOFOL N/A 07/10/2016   Procedure: ESOPHAGOGASTRODUODENOSCOPY (EGD) WITH PROPOFOL;  Surgeon: Mickeal Skinner, MD;  Location: [facility];  Service: General;  Laterality: N/A;  . EXCISION OF ENDOMETRIOMA     removed 2009   . LAPAROSCOPIC GASTRIC SLEEVE RESECTION N/A 11/19/2015   Procedure: LAPAROSCOPIC GASTRIC SLEEVE RESECTION, UPPER ENDO;  Surgeon: Arta Bruce Kinsinger, MD;  Location: [facility];  Service: General;  Laterality: N/A;  . NEPHRECTOMY Right    right 1991 bottob 1999 top half  . PERCUTANEOUS NEPHROSTOMY     multiple times   . TUBAL LIGATION    . URETEROSCOPY     multiple times     OB History    No data available       Home Medications    Prior to Admission medications   Medication Sig Start Date End Date Taking? Authorizing Provider  Calcium Carbonate-Vitamin D (CALCIUM 600+D) 600-400 MG-UNIT tablet Take 1 tablet by mouth 3 (three) times daily.    Historical Provider, MD  colchicine 0.6 MG tablet Take 1 tablet (0.6 mg total) by mouth 2 (two) times daily. 03/22/15   Rolland Porter, MD  Cyanocobalamin (VITAMIN B-12) 1000 MCG SUBL Place 1,000 mcg under the tongue daily.    Historical Provider, MD  levothyroxine (SYNTHROID, LEVOTHROID) 112 MCG tablet Take 2 tablets (224 mcg total) by mouth daily before breakfast.  07/14/16   Renato Shin, MD  LORazepam (ATIVAN) 1 MG tablet Take 1 mg by mouth every 6 (six) hours as needed (for nausea).    Historical Provider, MD  Multiple Vitamins-Minerals (MULTIVITAMIN WITH MINERALS) tablet Take 1 tablet by mouth 2 (two) times daily.     Historical Provider, MD    Family History Family History  Problem Relation Age of Onset  . Ovarian cancer Paternal Grandmother   . Diabetes Maternal Grandmother   . Diabetes Mother     Social History Social History  Substance Use Topics  .  Smoking status: Never Smoker  . Smokeless tobacco: Never Used  . Alcohol use No     Allergies   Doxycycline; Food; Toradol [ketorolac tromethamine]; Ibuprofen; Indomethacin; Nsaids; Prednisone; and Thiola [tiopronin]   Review of Systems Review of Systems  Constitutional: Positive for diaphoresis. Negative for chills, fever and malaise/fatigue.  HENT: Negative for congestion, ear pain and sore throat.   Eyes: Negative for pain and visual disturbance.  Respiratory: Negative for cough and shortness of breath.   Cardiovascular: Positive for chest pain and syncope. Negative for palpitations.  Gastrointestinal: Positive for nausea. Negative for abdominal pain, bowel incontinence and vomiting.  Genitourinary: Negative for bladder incontinence, dysuria and hematuria.  Musculoskeletal: Negative for arthralgias and back pain.  Skin: Negative for color change and rash.  Neurological: Positive for syncope and light-headedness. Negative for dizziness, focal weakness, seizures, weakness and headaches.  Psychiatric/Behavioral: Negative for confusion.  All other systems reviewed and are negative.    Physical Exam Updated Vital Signs BP 95/68   Pulse 69   Resp 12   Ht 5\' 4"  (1.626 m)   Wt 85.7 kg   LMP 07/20/2016 (Exact Date)   SpO2 99%   BMI 32.44 kg/m   Physical Exam  Constitutional: She appears well-developed and well-nourished. No distress.  HENT:  Head: Normocephalic and atraumatic.  Eyes: Conjunctivae are normal.  Neck: Neck supple.  Cardiovascular: Normal rate and regular rhythm.   No murmur heard. Pulmonary/Chest: Effort normal and breath sounds normal. No respiratory distress.  Abdominal: Soft. There is no tenderness.  Musculoskeletal: She exhibits no edema.  Neurological: She is alert. She has normal strength. No cranial nerve deficit or sensory deficit. Coordination normal. GCS eye subscore is 4. GCS verbal subscore is 5. GCS motor subscore is 6.  Skin: Skin is warm and  dry. No rash noted.  Psychiatric: She has a normal mood and affect. Her speech is normal and behavior is normal. Cognition and memory are normal.  Nursing note and vitals reviewed.    ED Treatments / Results  Labs (all labs ordered are listed, but only abnormal results are displayed) Labs Reviewed  COMPREHENSIVE METABOLIC PANEL - Abnormal; Notable for the following:       Result Value   Glucose, Bld 108 (*)    BUN 26 (*)    Creatinine, Ser 1.09 (*)    AST 14 (*)    ALT 13 (*)    Total Bilirubin 0.2 (*)    All other components within normal limits  I-STAT CHEM 8, ED - Abnormal; Notable for the following:    BUN 28 (*)    Creatinine, Ser 1.10 (*)    Glucose, Bld 106 (*)    Calcium, Ion 1.10 (*)    Hemoglobin 11.9 (*)    HCT 35.0 (*)    All other components within normal limits  CBC WITH DIFFERENTIAL/PLATELET  PROTIME-INR  URINALYSIS, ROUTINE W REFLEX MICROSCOPIC  I-STAT  BETA HCG BLOOD, ED ([facility], [facility], [facility] ONLY)  I-STAT TROPOININ, ED  I-STAT TROPOININ, ED    EKG  EKG Interpretation None       Radiology Dg Chest 2 View  Result Date: 07/22/2016 CLINICAL DATA:  36 y/o  F; syncope. EXAM: CHEST  2 VIEW COMPARISON:  05/31/2015 chest radiograph FINDINGS: Stable heart size and mediastinal contours are within normal limits. Both lungs are clear. The visualized skeletal structures are unremarkable. Right upper quadrant surgical clips, probably related to prior right nephrectomy. IMPRESSION: No active cardiopulmonary disease. Electronically Signed   By: Kristine Garbe M.D.   On: 07/22/2016 22:21   Ct Angio Chest/abd/pel For Dissection W And/or W/wo  Result Date: 07/22/2016 CLINICAL DATA:  Acute onset of generalized chest pain and syncope. Initial encounter. EXAM: CT ANGIOGRAPHY CHEST, ABDOMEN AND PELVIS TECHNIQUE: Multidetector CT imaging through the chest, abdomen and pelvis was performed using the standard protocol during bolus administration of intravenous contrast.  Multiplanar reconstructed images and MIPs were obtained and reviewed to evaluate the vascular anatomy. CONTRAST:  98 mL of Isovue 370 IV contrast COMPARISON:  CT of the abdomen and pelvis from 06/14/2016, and chest radiograph performed earlier today at 10:06 p.m. FINDINGS: CTA CHEST FINDINGS Cardiovascular: There is no evidence of aortic dissection. There is no evidence of aneurysmal dilatation. No calcific atherosclerotic disease is seen. The great vessels are grossly unremarkable in appearance. There is no evidence of significant pulmonary embolus. The heart is normal in size. Scattered air within the right ventricle may arise from a peripheral IV catheter. Mediastinum/Nodes: The mediastinum is otherwise unremarkable. No mediastinal lymphadenopathy is seen. No pericardial effusion is identified. The visualized portions of the thyroid gland are unremarkable. No axillary lymphadenopathy is seen. Lungs/Pleura: The lungs are clear bilaterally. No focal consolidation, pleural effusion or pneumothorax is seen. No masses are identified. Musculoskeletal: No acute osseous abnormalities are identified. The visualized musculature is unremarkable in appearance. Review of the MIP images confirms the above findings. CTA ABDOMEN AND PELVIS FINDINGS VASCULAR Aorta: There is no evidence of aortic dissection. There is no evidence of aneurysmal dilatation. No calcific atherosclerotic disease is seen. Celiac: The celiac trunk appears fully patent. SMA: The superior mesenteric artery is unremarkable in appearance. Renals: The left renal artery appears fully patent. The patient is status post right-sided nephrectomy. IMA: The inferior mesenteric artery appears patent. Inflow: The common, external and internal iliac arteries appear intact bilaterally. The common femoral arteries, and their proximal branches, are grossly unremarkable. Veins: Visualized venous structures are grossly unremarkable. Review of the MIP images confirms the  above findings. NON-VASCULAR Hepatobiliary: The liver is unremarkable in appearance. The gallbladder is unremarkable in appearance. The common bile duct remains normal in caliber. Pancreas: The pancreas is within normal limits. Spleen: The spleen is unremarkable in appearance. Adrenals/Urinary Tract: The adrenal glands are unremarkable in appearance. The left kidney is unremarkable in appearance. A 5.0 cm cyst is noted at the right renal bed. The patient is status post right-sided nephrectomy. There is no evidence of hydronephrosis. There is atrophy of the lower pole of the left kidney. No renal or ureteral stones are identified. No perinephric stranding is seen. Stomach/Bowel: The stomach is unremarkable in appearance. The small bowel is within normal limits. The appendix is normal in caliber, without evidence of appendicitis. The colon is unremarkable in appearance. Lymphatic: No retroperitoneal or pelvic sidewall lymphadenopathy is seen. Reproductive: The bladder is mildly distended and grossly unremarkable. The uterus is grossly unremarkable in appearance. The ovaries are  relatively symmetric. No suspicious adnexal masses are seen. Other: No additional soft tissue abnormalities are seen. Musculoskeletal: No acute osseous abnormalities are identified. The visualized musculature is unremarkable in appearance. Review of the MIP images confirms the above findings. IMPRESSION: 1. No evidence of aortic dissection. No evidence of aneurysmal dilatation. No calcific atherosclerotic disease seen. 2. No evidence of significant pulmonary embolus. 3. Scattered air within the right ventricle may arise from a peripheral IV catheter. 4. 5.0 cm cyst at the right renal bed. Status post right-sided nephrectomy. 5. Atrophy of the lower pole of the left kidney. Electronically Signed   By: Garald Balding M.D.   On: 07/22/2016 23:46    Procedures Procedures (including critical care time)  Medications Ordered in ED Medications   sodium chloride 0.9 % bolus 1,000 mL (0 mLs Intravenous Stopped 07/23/16 0007)  iopamidol (ISOVUE-370) 76 % injection (100 mLs  Contrast Given 07/22/16 2301)     Initial Impression / Assessment and Plan / ED Course  I have reviewed the triage vital signs and the nursing notes.  Pertinent labs & imaging results that were available during my care of the patient were reviewed by me and considered in my medical decision making (see chart for details).     36 year old female with history of previous bariatric surgery who presents in the setting of syncopal event. Patient reports she was at church when she began to feel slightly lightheaded. Patient has had history of hypoglycemia and ate some candy at that time. Patient reports after this she continued to have slight lightheadedness that was improved by sitting down. Patient with mild nausea as well. Patient was assisted outside by a friend and at that time had a syncopal event. During this event and patient had no seizure-like activity, no vomiting, no trauma to the head as she was gently laid to the ground. Patient returned quickly to normal and had no postictal state with EMS. Initial blood glucose in the 200s. Patient and reported some mild left-sided chest discomfort. EMS gave aspirin and nitroglycerin and patient had mild improvement in symptoms. She did continue to endorse some mild nausea.  On arrival patient was hemodynamic stable and afebrile. Patient neurovascular intact examination. Patient with reported mild decreased by mouth intake today. EKG revealed no signs of acute ischemia, ST segment elevation or depression. Chest x-ray with no acute cardiopulmonary abdomen mildly. Laboratory analysis revealed no acute kidney injury, electrolyte abnormality, signs of anemia. Initial troponin negative. CT of chest revealed no signs of pulmonary embolus, aortic dissection. Due to patient's age and risk factors I have very low suspicion for ACS and do not  believe repeat troponin is indicated at this time. Do not believe patient has emergent life threatening cause of syncope at this time as she has no signs of pneumothorax, ACS, PE, aortic dissection. Patient remained hemodynamically stable in emergency Department and was given IV fluids. As likely cause being vasovagal syncope versus dehydration. We'll discharge home with plan to increase oral fluid intake and follow up with primary care physician for further management of this issue. Strict return percussion is given and patient stable at time of discharge.  Final Clinical Impressions(s) / ED Diagnoses   Final diagnoses:  Syncope and collapse    New Prescriptions New Prescriptions   No medications on file     Esaw Grandchild, MD 07/23/16 0240    Noemi Chapel, MD 07/23/16 0028

## 2016-07-22 NOTE — ED Notes (Signed)
Dr Miller at bedside. 

## 2016-07-22 NOTE — ED Triage Notes (Signed)
Pt at church singing started to feel light headed and thought it was her sugar, pt ate a couple of candies, Pt still felt lightheaded, nauseous, and diaphoretic after eating the candies. Then started to walk outside when patient had syncope episode. PT family states she was unconscious for two three minutes. When EMS arrived patient was still lethargic. EMG CBG was 201 Pt then started to have chest pain with a pain of 7/10. Ems gave Asprin 324, and nitro SL which decreased pain to 4/10. PT still had nausea EMS gave 4mg  of zofran.

## 2016-07-22 NOTE — ED Provider Notes (Signed)
I saw and evaluated the patient, reviewed the resident's note and I agree with the findings and plan.  Pertinent History: The patient is a 36 year old female, she has a prior history of a gastric sleeve which was performed within the last year, she was also recently diagnosed by endoscopy to have H. pylori gastritis and started on antibiotics. She has had nausea and poor appetite with poor fluid intake for the last couple of months. She reports that while she was singing in the church choir this evening she became overheated, had clammy feeling, became lightheaded and went to sit down. This lasted for several minutes, ultimately the patient had a syncopal event. She came back to, had a very short period of confusion and was back to normal very quickly without any signs of seizures. No tongue biting, no incontinence, her only symptom that happened just before and has continued after the syncopal event was some left-sided chest pain going to the left shoulder. 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 She's had a heart catheterization in the past which showed no obstructions of her coronary arteries but did show that she had abnormal coronary anatomy with only 2 coronary arteries. This is reported per the patient, this was [place].  Pertinent Exam findings: On exam the patient is in no distress, she has clear lung sounds, clear heart sounds, equal pulses at the radial arteries, no edema, no JVD, no distress. She has an EKG which is unremarkable, we'll pursue further testing to rule out pulmonary embolism, much less likely to be dissection, myocardial infarction, likely related to relative dehydration and situational vagal syncopal.. The patient is hemodynamically stable at this time.   I personally interpreted the EKG as well as the resident and agree with the interpretation on the resident's chart.  Final diagnoses:  Syncope and collapse      Noemi Chapel, MD 07/23/16  0028

## 2016-07-23 LAB — URINALYSIS, ROUTINE W REFLEX MICROSCOPIC
Bacteria, UA: NONE SEEN
Bilirubin Urine: NEGATIVE
Glucose, UA: NEGATIVE mg/dL
Ketones, ur: NEGATIVE mg/dL
Leukocytes, UA: NEGATIVE
Nitrite: NEGATIVE
Protein, ur: 100 mg/dL — AB
Specific Gravity, Urine: >1.046 — ABNORMAL HIGH (ref 1.005–1.030)
pH: 6 (ref 5.0–8.0)

## 2016-07-23 NOTE — ED Notes (Signed)
Pt ambulated to restroom with steady gait, did report some dizziness.

## 2016-07-23 NOTE — ED Notes (Signed)
PT states understanding of care given, follow up care, and medication prescribed. PT ambulated from ED to car with a steady gait. 

## 2016-07-24 ENCOUNTER — Ambulatory Visit (INDEPENDENT_AMBULATORY_CARE_PROVIDER_SITE_OTHER): Payer: BLUE CROSS/BLUE SHIELD | Admitting: Cardiovascular Disease

## 2016-07-24 ENCOUNTER — Encounter: Payer: Self-pay | Admitting: Cardiovascular Disease

## 2016-07-24 VITALS — BP 98/64 | HR 84 | Ht 64.0 in | Wt 193.6 lb

## 2016-07-24 DIAGNOSIS — R079 Chest pain, unspecified: Secondary | ICD-10-CM

## 2016-07-24 DIAGNOSIS — R55 Syncope and collapse: Secondary | ICD-10-CM | POA: Diagnosis not present

## 2016-07-24 DIAGNOSIS — R002 Palpitations: Secondary | ICD-10-CM | POA: Insufficient documentation

## 2016-07-24 NOTE — Progress Notes (Signed)
Cardiology Office Note   Date:  07/24/2016   ID:  Alison Fitzgerald, DOB 1980/08/31, MRN [id number]  PCP:  Alison Kroner, MD  Cardiologist:   Alison Moores, MD   Chief Complaint  Patient presents with  . Follow-up    SVT   Problem list 1. Palpitations 2. Diabetes mellitus 3. PCOS 4. Hypothyroidism 5. Congential coronary artery disease:   Essentially Single coronary artery with a small posterior LCx by patients report.    History of Present Illness: Alison Fitzgerald is a 36 y.o. female who presents for palpitation .  Records from [facility] - Dr. Moreen Fowler , are reviewed.   Has a hx of congenital Coronary artery disease.   Had a cath in 2009. Was essentially found to have a single coronary artery .    Has been having some chest pain / dyspnea  Severe CP recently ,  Pain comes and goes.   Has palpitations that wake her up at night . Associated with some dyspnea  Has had some tachypalps - a co-worker measured her HR at 201 several weeks ago  That episode was associated with lightheadedness and chest pressure.   Episodes can last as long as 1 1/2 hours.  Typically last 45 min - 1 hour.    Recent glucose levels are poorly controlled.   - HbA1C > 10. Does not exercise Works 2 jobs - works for [employer] and also works at [employer] .   June 06, 2015 Alison Fitzgerald is seen today for a follow up of her palpitations and CAD .   She has congenital coronary artery anomalies ( reportedly has a single coronary artery with a very small posterior lateral artery that has it's own origen - found at CT angio of the heart 0)   She has diabetes that has historically been poorly controlled.  We started her on metoprolol at her last visit The metoprolol seems to be helping  Has been limiting her caffiene intake   July 24, 2016:  Alison Fitzgerald is seen for follow-up of her congenital coronary artery anomalies and supraventricular tachycardia. She was recent seen in the emergency room for an  episode of syncope. She lost consciousness for several minutes.  She had a gastric sleeve surgery last august 2 days ago, while at church, she had chest pain , tightness, Nausea, radiation to the back and pain down her left arm .  Crushing chest pain  Left church and walked home  Passed out for several minutes.   Struggling to breath according to her friend Alison Fitzgerald) who was with her. No seizure like activity .  CT angio of chest was negative for PE.      Past Medical History:  Diagnosis Date  . Anemia    only with pregnancy  . Diabetes mellitus without complication (Alison Fitzgerald)    type 2 diet controlled  . Fatty liver disease, nonalcoholic    improving with weight loss  . Gout   . Hypothyroidism   . Injury of left great toe    due to trauma- area required stitches and stitches are out- DR Fitzgerald aware per pateint   . Kidney stones   . PONV (postoperative nausea and vomiting)   . Staghorn kidney stones    right nephrectomy     Past Surgical History:  Procedure Laterality Date  . CESAREAN SECTION     x 1  . cystocopy      multiple times   . ESOPHAGOGASTRODUODENOSCOPY (EGD) WITH PROPOFOL N/A  07/10/2016   Procedure: ESOPHAGOGASTRODUODENOSCOPY (EGD) WITH PROPOFOL;  Surgeon: Alison Skinner, MD;  Location: [facility];  Service: General;  Laterality: N/A;  . EXCISION OF ENDOMETRIOMA     removed 2009   . LAPAROSCOPIC GASTRIC SLEEVE RESECTION N/A 11/19/2015   Procedure: LAPAROSCOPIC GASTRIC SLEEVE RESECTION, UPPER ENDO;  Surgeon: Alison Bruce Kinsinger, MD;  Location: [facility];  Service: General;  Laterality: N/A;  . NEPHRECTOMY Right    right 1991 bottob 1999 top half  . PERCUTANEOUS NEPHROSTOMY     multiple times   . TUBAL LIGATION    . URETEROSCOPY     multiple times      Current Outpatient Prescriptions  Medication Sig Dispense Refill  . amoxicillin (AMOXIL) 500 MG tablet Take 1,000 mg by mouth 2 (two) times daily.  0  . Calcium Carbonate-Vitamin D (CALCIUM  600+D) 600-400 MG-UNIT tablet Take 1 tablet by mouth 3 (three) times daily.    . clarithromycin (BIAXIN) 500 MG tablet Take 500 mg by mouth 2 (two) times daily.  0  . colchicine 0.6 MG tablet Take 1 tablet (0.6 mg total) by mouth 2 (two) times daily. 30 tablet 0  . Cyanocobalamin (VITAMIN B-12) 1000 MCG SUBL Place 1,000 mcg under the tongue daily.    Marland Kitchen levothyroxine (SYNTHROID, LEVOTHROID) 112 MCG tablet Take 2 tablets (224 mcg total) by mouth daily before breakfast. 60 tablet 2  . LORazepam (ATIVAN) 1 MG tablet Take 1 mg by mouth every 6 (six) hours as needed (for nausea).    . Multiple Vitamins-Minerals (MULTIVITAMIN WITH MINERALS) tablet Take 1 tablet by mouth 2 (two) times daily.     . pantoprazole (PROTONIX) 40 MG tablet Take 40 mg by mouth as needed. AFTER GASTRIC SLEEVE  0   No current facility-administered medications for this visit.     Allergies:   Doxycycline; Food; Toradol [ketorolac tromethamine]; Ibuprofen; Indomethacin; Nsaids; Prednisone; and Thiola [tiopronin]    Social History:  The patient  reports that she has never smoked. She has never used smokeless tobacco. She reports that she does not drink alcohol or use drugs.   Family History:  The patient's family history includes Diabetes in her maternal grandmother and mother; Ovarian cancer in her paternal grandmother.    ROS:  Please see the history of present illness.    Review of Systems: Constitutional:  denies fever, chills, diaphoresis, appetite change and fatigue.  HEENT: denies photophobia, eye pain, redness, hearing loss, ear pain, congestion, sore throat, rhinorrhea, sneezing, neck pain, neck stiffness and tinnitus.  Respiratory: denies SOB, DOE, cough, chest tightness, and wheezing.  Cardiovascular: admits to  palpitations    Gastrointestinal: denies nausea, vomiting, abdominal pain, diarrhea, constipation, blood in stool.  Genitourinary: denies dysuria, urgency, frequency, hematuria, flank pain and difficulty  urinating.  Musculoskeletal: denies  myalgias, back pain, joint swelling, arthralgias and gait problem.   Skin: denies pallor, rash and wound.  Neurological: denies dizziness, seizures, syncope, weakness, light-headedness, numbness and headaches.   Hematological: denies adenopathy, easy bruising, personal or family bleeding history.  Psychiatric/ Behavioral: denies suicidal ideation, mood changes, confusion, nervousness, sleep disturbance and agitation.       All other systems are reviewed and negative.    PHYSICAL EXAM: VS:  BP 98/64 (BP Location: Left Arm, Patient Position: Sitting, Cuff Size: Normal)   Pulse 84   Ht 5\' 4"  (1.626 m)   Wt 193 lb 9.6 oz (87.8 kg)   LMP 07/20/2016 (Exact Date)   SpO2 99%   BMI 33.23  kg/m  , BMI Body mass index is 33.23 kg/m. GEN: Well nourished, well developed, in no acute distress  HEENT: normal  Neck: no JVD, carotid bruits, or masses Cardiac: RRR; no murmurs, rubs, or gallops,no edema  Respiratory:  clear to auscultation bilaterally, normal work of breathing GI: soft, nontender, nondistended, + BS MS: no deformity or atrophy  Skin: warm and dry, no rash Neuro:  Strength and sensation are intact Psych: normal   EKG:  EKG is not ordered today. The ekg ordered 01/28/15  demonstrates sinus tach .  No ST or T wave changes.    Recent Labs: 12/24/2015: TSH 3.42 07/22/2016: ALT 13; BUN 28; Creatinine, Ser 1.10; Hemoglobin 11.9; Platelets 304; Potassium 3.6; Sodium 139    Lipid Panel No results found for: CHOL, TRIG, HDL, CHOLHDL, VLDL, LDLCALC, LDLDIRECT    Wt Readings from Last 3 Encounters:  07/24/16 193 lb 9.6 oz (87.8 kg)  07/22/16 189 lb (85.7 kg)  07/10/16 189 lb (85.7 kg)      Other studies Reviewed: Additional studies/ records that were reviewed today include: . Review of the above records demonstrates:    ASSESSMENT AND PLAN:  1. Syncope: She presented to the emergency room 2 days ago with an episode of syncope associate  with chest discomfort. She had a CT angiogram of the chest which ruled out pulmonary embolus. Her symptoms are somewhat concerning for angina. We will schedule her for a stress Myoview study.  We'll get an echocardiogram. She has worn an event monitor in the past that did not reveal any significant irregularities. We've we may need to repeat the monitor at some point.  2. Chest tightness: She presents several days after an episode of chest tightness with radiation into the back and into her arms. We will do a stress Myoview study. She did not have any EKG changes when she presented to the emergency room and her troponin levels were negative.   3.  Palpitations: She presents with fol follow up  of palpitations.   The 30 day monitor did not reveal any significant arrhythmias.  Continue current dose of metoprolol   4. Diabetes mellitus: Her diabetes has basically resolved and she had after the sleeve surgery.  5. Hypothyroidism: She is on Synthroid. Followed by Dr. Loanne Drilling.  Last TSH is normal.  6. CAD:   Reportedly has congenital CAD with a single coronary artery  + a small posterior lateral artery .  Continue medical management   Current medicines are reviewed at length with the patient today.  The patient does not have concerns regarding medicines.  The following changes have been made:  no change  Labs/ tests ordered today include:  No orders of the defined types were placed in this encounter.    Disposition:   FU with me in 3   months .     Alison Moores, MD  07/24/2016 9:07 AM    [facility] [address], [place], [place]  [address] Phone: [phone]; Fax: [phone]    6

## 2016-07-24 NOTE — Patient Instructions (Signed)
Medication Instructions:  Your physician recommends that you continue on your current medications as directed. Please refer to the Current Medication list given to you today.   Labwork: None Ordered   Testing/Procedures: Your physician has requested that you have an exercise stress myoview. For further information please visit [email or web address]. Please follow instruction sheet, as given.  Your physician has requested that you have an echocardiogram. Echocardiography is a painless test that uses sound waves to create images of your heart. It provides your doctor with information about the size and shape of your heart and how well your heart's chambers and valves are working. This procedure takes approximately one hour. There are no restrictions for this procedure.   Follow-Up: Your physician recommends that you schedule a follow-up appointment in: 3 months with Dr. Acie Fredrickson.    If you need a refill on your cardiac medications before your next appointment, please call your pharmacy.   Thank you for choosing [facility]! Christen Bame, RN [phone]

## 2016-07-27 ENCOUNTER — Telehealth: Payer: Self-pay | Admitting: Cardiovascular Disease

## 2016-07-27 NOTE — Telephone Encounter (Signed)
Release faxed to [facility].  Fax-[phone]

## 2016-08-04 ENCOUNTER — Telehealth (HOSPITAL_COMMUNITY): Payer: Self-pay | Admitting: *Deleted

## 2016-08-04 NOTE — Telephone Encounter (Signed)
Patient given detailed instructions per Myocardial Perfusion Study Information Sheet for the test on 08/06/16 at 0715. Patient notified to arrive 15 minutes early and that it is imperative to arrive on time for appointment to keep from having the test rescheduled.  If you need to cancel or reschedule your appointment, please call the office within 24 hours of your appointment. Failure to do so may result in a cancellation of your appointment, and a $50 no show fee. Patient verbalized understanding.Neziah Braley, Ranae Palms

## 2016-08-06 ENCOUNTER — Other Ambulatory Visit: Payer: Self-pay

## 2016-08-06 ENCOUNTER — Ambulatory Visit (HOSPITAL_BASED_OUTPATIENT_CLINIC_OR_DEPARTMENT_OTHER): Payer: BLUE CROSS/BLUE SHIELD

## 2016-08-06 ENCOUNTER — Ambulatory Visit (HOSPITAL_COMMUNITY): Payer: BLUE CROSS/BLUE SHIELD | Attending: Cardiovascular Disease

## 2016-08-06 DIAGNOSIS — Q245 Malformation of coronary vessels: Secondary | ICD-10-CM | POA: Diagnosis not present

## 2016-08-06 DIAGNOSIS — E039 Hypothyroidism, unspecified: Secondary | ICD-10-CM | POA: Diagnosis not present

## 2016-08-06 DIAGNOSIS — R55 Syncope and collapse: Secondary | ICD-10-CM

## 2016-08-06 DIAGNOSIS — E119 Type 2 diabetes mellitus without complications: Secondary | ICD-10-CM | POA: Diagnosis not present

## 2016-08-06 DIAGNOSIS — R002 Palpitations: Secondary | ICD-10-CM | POA: Diagnosis present

## 2016-08-06 DIAGNOSIS — R079 Chest pain, unspecified: Secondary | ICD-10-CM | POA: Insufficient documentation

## 2016-08-06 LAB — MYOCARDIAL PERFUSION IMAGING
CHL CUP RESTING HR STRESS: 75 {beats}/min
CSEPED: 9 min
CSEPEDS: 0 s
CSEPHR: 88 %
Estimated workload: 10.1 METS
LVDIAVOL: 110 mL (ref 46–106)
LVSYSVOL: 57 mL
MPHR: 185 {beats}/min
Peak HR: 164 {beats}/min
RATE: 0.42
SDS: 0
SRS: 6
SSS: 4
TID: 1

## 2016-08-06 LAB — ECHOCARDIOGRAM COMPLETE
HEIGHTINCHES: 64 in
WEIGHTICAEL: 3088 [oz_av]

## 2016-08-06 MED ORDER — PERFLUTREN LIPID MICROSPHERE
1.0000 mL | INTRAVENOUS | Status: AC | PRN
  Administered 2016-08-06: 2 mL via INTRAVENOUS

## 2016-08-06 MED ORDER — TECHNETIUM TC 99M TETROFOSMIN IV KIT
32.6000 | PACK | Freq: Once | INTRAVENOUS | Status: AC | PRN
  Administered 2016-08-06: 32.6 via INTRAVENOUS
  Filled 2016-08-06: qty 33

## 2016-08-06 MED ORDER — TECHNETIUM TC 99M TETROFOSMIN IV KIT
10.1000 | PACK | Freq: Once | INTRAVENOUS | Status: AC | PRN
  Administered 2016-08-06: 10.1 via INTRAVENOUS
  Filled 2016-08-06: qty 11

## 2016-08-17 ENCOUNTER — Telehealth: Payer: Self-pay | Admitting: Cardiovascular Disease

## 2016-08-17 MED ORDER — METOPROLOL TARTRATE 25 MG PO TABS: 12.5000 mg | ORAL_TABLET | Freq: Two times a day (BID) | ORAL | 3 refills | Status: DC

## 2016-08-17 NOTE — Telephone Encounter (Signed)
Lets restart metoprolol 12. 5 mg BID and see if that helps

## 2016-08-17 NOTE — Telephone Encounter (Signed)
Reviewed Dr. Elmarie Shiley advice with patient who verbalized understanding and agreement. I advised her to call back prior to next office visit with questions or concerns. She thanked me for the call.

## 2016-08-17 NOTE — Telephone Encounter (Signed)
Spoke with patient who states she was advised by Dr. Acie Fredrickson to call back if her symptoms of fast heart rate and pre-syncope occurred again. She states today she had 3 episodes within a few hours where she feels her heart rate speed up and she gets light-headed and feels like she will pass out. She states her pulse at this time was 158 bpm. She states she laid down as advised by Dr. Acie Fredrickson and after a few minutes she felt better; she got up and sat at her desk again and it happened 2 more times, each time she felt better after laying down. She was taken off metoprolol a while ago by her PCP so she is not currently taking any beta blocker. I advised that I will forward message to Dr. Acie Fredrickson for advice and will call her back before I leave today. She verbalized understanding and agreement and thanked me for the call.

## 2016-08-17 NOTE — Telephone Encounter (Signed)
New message   Pt c/o Syncope: STAT if syncope occurred within 30 minutes and pt complains of lightheadedness High Priority if episode of passing out, completely, today or in last 24 hours   1. Did you pass out today? Pt did not pass out but was instructed to call us the next time she had the "feeling" again since she just had the echo done this month.  2. When is the last time you passed out? Last month  3. Has this occurred multiple times? Continuously keeps happening  4. Did you have any symptoms prior to passing out? Heart starts racing and she feels light headed

## 2016-08-27 ENCOUNTER — Inpatient Hospital Stay (HOSPITAL_COMMUNITY)
Admission: EM | Admit: 2016-08-27 | Discharge: 2016-08-31 | DRG: 419 | Disposition: A | Discharge status: Home / Self Care | Payer: BLUE CROSS/BLUE SHIELD | Attending: Internal Medicine | Admitting: Internal Medicine

## 2016-08-27 ENCOUNTER — Encounter (HOSPITAL_COMMUNITY): Payer: Self-pay | Admitting: *Deleted

## 2016-08-27 DIAGNOSIS — M109 Gout, unspecified: Secondary | ICD-10-CM | POA: Diagnosis present

## 2016-08-27 DIAGNOSIS — Z87442 Personal history of urinary calculi: Secondary | ICD-10-CM

## 2016-08-27 DIAGNOSIS — Z903 Acquired absence of stomach [part of]: Secondary | ICD-10-CM

## 2016-08-27 DIAGNOSIS — K760 Fatty (change of) liver, not elsewhere classified: Secondary | ICD-10-CM | POA: Diagnosis present

## 2016-08-27 DIAGNOSIS — K811 Chronic cholecystitis: Principal | ICD-10-CM | POA: Diagnosis present

## 2016-08-27 DIAGNOSIS — E039 Hypothyroidism, unspecified: Secondary | ICD-10-CM | POA: Diagnosis present

## 2016-08-27 DIAGNOSIS — E785 Hyperlipidemia, unspecified: Secondary | ICD-10-CM | POA: Diagnosis present

## 2016-08-27 DIAGNOSIS — R1013 Epigastric pain: Secondary | ICD-10-CM

## 2016-08-27 DIAGNOSIS — Z905 Acquired absence of kidney: Secondary | ICD-10-CM

## 2016-08-27 DIAGNOSIS — R112 Nausea with vomiting, unspecified: Secondary | ICD-10-CM

## 2016-08-27 DIAGNOSIS — Z79899 Other long term (current) drug therapy: Secondary | ICD-10-CM

## 2016-08-27 DIAGNOSIS — K861 Other chronic pancreatitis: Secondary | ICD-10-CM | POA: Diagnosis not present

## 2016-08-27 DIAGNOSIS — R197 Diarrhea, unspecified: Secondary | ICD-10-CM

## 2016-08-27 DIAGNOSIS — I471 Supraventricular tachycardia, unspecified: Secondary | ICD-10-CM | POA: Diagnosis present

## 2016-08-27 DIAGNOSIS — R1011 Right upper quadrant pain: Secondary | ICD-10-CM

## 2016-08-27 DIAGNOSIS — E119 Type 2 diabetes mellitus without complications: Secondary | ICD-10-CM

## 2016-08-27 DIAGNOSIS — R109 Unspecified abdominal pain: Secondary | ICD-10-CM

## 2016-08-27 DIAGNOSIS — K859 Acute pancreatitis without necrosis or infection, unspecified: Secondary | ICD-10-CM

## 2016-08-27 DIAGNOSIS — IMO0002 Reserved for concepts with insufficient information to code with codable children: Secondary | ICD-10-CM | POA: Insufficient documentation

## 2016-08-27 DIAGNOSIS — D649 Anemia, unspecified: Secondary | ICD-10-CM | POA: Diagnosis present

## 2016-08-27 LAB — URINALYSIS, ROUTINE W REFLEX MICROSCOPIC
BACTERIA UA: NONE SEEN
Bilirubin Urine: NEGATIVE
Glucose, UA: NEGATIVE mg/dL
HGB URINE DIPSTICK: NEGATIVE
KETONES UR: NEGATIVE mg/dL
Leukocytes, UA: NEGATIVE
NITRITE: NEGATIVE
Protein, ur: 100 mg/dL — AB
Specific Gravity, Urine: 1.017 (ref 1.005–1.030)
pH: 6 (ref 5.0–8.0)

## 2016-08-27 LAB — COMPREHENSIVE METABOLIC PANEL
ALBUMIN: 4 g/dL (ref 3.5–5.0)
ALT: 14 U/L (ref 14–54)
AST: 16 U/L (ref 15–41)
Alkaline Phosphatase: 63 U/L (ref 38–126)
Anion gap: 10 (ref 5–15)
BUN: 27 mg/dL — AB (ref 6–20)
CHLORIDE: 101 mmol/L (ref 101–111)
CO2: 27 mmol/L (ref 22–32)
Calcium: 10.2 mg/dL (ref 8.9–10.3)
Creatinine, Ser: 1 mg/dL (ref 0.44–1.00)
GFR calc Af Amer: >60 mL/min (ref >60)
GFR calc non Af Amer: >60 mL/min (ref >60)
Glucose, Bld: 95 mg/dL (ref 65–99)
POTASSIUM: 3.9 mmol/L (ref 3.5–5.1)
Sodium: 138 mmol/L (ref 135–145)
Total Bilirubin: 0.3 mg/dL (ref 0.3–1.2)
Total Protein: 8.1 g/dL (ref 6.5–8.1)

## 2016-08-27 LAB — CBC
HEMATOCRIT: 38.3 % (ref 36.0–46.0)
HEMOGLOBIN: 12.6 g/dL (ref 12.0–15.0)
MCH: 30.2 pg (ref 26.0–34.0)
MCHC: 32.9 g/dL (ref 30.0–36.0)
MCV: 91.8 fL (ref 78.0–100.0)
Platelets: 340 10*3/uL (ref 150–400)
RBC: 4.17 MIL/uL (ref 3.87–5.11)
RDW: 13.4 % (ref 11.5–15.5)
WBC: 10.6 10*3/uL — ABNORMAL HIGH (ref 4.0–10.5)

## 2016-08-27 LAB — POC URINE PREG, ED: PREG TEST UR: NEGATIVE

## 2016-08-27 LAB — LIPASE, BLOOD: Lipase: 73 U/L — ABNORMAL HIGH (ref 11–51)

## 2016-08-27 MED ORDER — ONDANSETRON HCL 4 MG/2ML IJ SOLN
4.0000 mg | Freq: Once | INTRAMUSCULAR | Status: AC
  Administered 2016-08-27: 4 mg via INTRAVENOUS
  Filled 2016-08-27: qty 2

## 2016-08-27 MED ORDER — MORPHINE SULFATE (PF) 2 MG/ML IV SOLN
4.0000 mg | Freq: Once | INTRAVENOUS | Status: AC
  Administered 2016-08-27: 4 mg via INTRAVENOUS
  Filled 2016-08-27: qty 2

## 2016-08-27 MED ORDER — SODIUM CHLORIDE 0.9 % IV BOLUS (SEPSIS)
1000.0000 mL | Freq: Once | INTRAVENOUS | Status: AC
  Administered 2016-08-27: 1000 mL via INTRAVENOUS

## 2016-08-27 MED ORDER — FENTANYL CITRATE (PF) 100 MCG/2ML IJ SOLN
50.0000 ug | Freq: Once | INTRAMUSCULAR | Status: AC
  Administered 2016-08-27: 50 ug via INTRAVENOUS
  Filled 2016-08-27: qty 2

## 2016-08-27 MED ORDER — DIPHENHYDRAMINE HCL 50 MG/ML IJ SOLN
25.0000 mg | Freq: Once | INTRAMUSCULAR | Status: AC
  Administered 2016-08-27: 25 mg via INTRAVENOUS
  Filled 2016-08-27: qty 1

## 2016-08-27 NOTE — ED Provider Notes (Signed)
[facility] DEPT Provider Note   CSN: [id number] Arrival date & time: 08/27/16  1859     History   Chief Complaint Chief Complaint  Patient presents with  . Abdominal Pain    HPI Alison Fitzgerald is a 36 y.o. female.  36yo F w/ PMH including gastric sleeve, right nephrectomy, type 2 diabetes mellitus, gout who presents with abdominal pain, vomiting, and diarrhea. The patient has had recurrent episodes of pancreatitis. She presented here on 3/11 with nausea, vomiting, and abdominal pain. She followed up with her surgeon and had EGD which showed  H pylori and she was given treatment course. She finished this a few weeks ago. She was on ativan for severe N/V, stopped in April. Her symptoms seemed to improve until 2 days ago. She has had 2 days of severe abdominal pain, nausea/vomiting, and diarrhea. She has been on liquid diet with no relief, has tried phenergan once with no improvement. These are the same symptoms that she had at previous ER visit. T max 100.1. She reports pressure when urinating but no pain.  No cough/cold sx. No sick contacts. She spoke with PCP clinic and [facility] clinic and they instructed her to come to ED for evaluation.    The history is provided by the patient.  Abdominal Pain      Past Medical History:  Diagnosis Date  . Anemia    only with pregnancy  . Diabetes mellitus without complication ([place])    type 2 diet controlled  . Fatty liver disease, nonalcoholic    improving with weight loss  . Gout   . Hypothyroidism   . Injury of left great toe    due to trauma- area required stitches and stitches are out- DR Kinsinger aware per pateint   . Kidney stones   . PONV (postoperative nausea and vomiting)   . Staghorn kidney stones    right nephrectomy     Patient Active Problem List   Diagnosis Date Noted  . Recurrent pancreatitis ([place]) 08/27/2016  . Chest pain 07/24/2016  . Syncope 07/24/2016  . Palpitations 07/24/2016  .  Hypoglycemia 12/24/2015  . Morbid obesity ([place]) 11/19/2015  . SVT (supraventricular tachycardia) ([place]) 02/18/2015  . PCO (polycystic ovaries) 01/29/2015  . Dyslipidemia 01/29/2015  . Diabetes mellitus without complication ([place])   . Hypothyroidism   . Staghorn kidney stones     Past Surgical History:  Procedure Laterality Date  . CESAREAN SECTION     x 1  . cystocopy      multiple times   . ESOPHAGOGASTRODUODENOSCOPY (EGD) WITH PROPOFOL N/A 07/10/2016   Procedure: ESOPHAGOGASTRODUODENOSCOPY (EGD) WITH PROPOFOL;  Surgeon: Mickeal Skinner, MD;  Location: [facility];  Service: General;  Laterality: N/A;  . EXCISION OF ENDOMETRIOMA     removed 2009   . LAPAROSCOPIC GASTRIC SLEEVE RESECTION N/A 11/19/2015   Procedure: LAPAROSCOPIC GASTRIC SLEEVE RESECTION, UPPER ENDO;  Surgeon: Arta Bruce Kinsinger, MD;  Location: [facility];  Service: General;  Laterality: N/A;  . NEPHRECTOMY Right    right 1991 bottob 1999 top half  . PERCUTANEOUS NEPHROSTOMY     multiple times   . TUBAL LIGATION    . URETEROSCOPY     multiple times     OB History    No data available       Home Medications    Prior to Admission medications   Medication Sig Start Date End Date Taking? Authorizing Provider  Calcium Carbonate-Vitamin D (CALCIUM 600+D) 600-400 MG-UNIT tablet Take  1 tablet by mouth 3 (three) times daily.   Yes [provider]  colchicine 0.6 MG tablet Take 1 tablet (0.6 mg total) by mouth 2 (two) times daily. Patient taking differently: Take 0.6 mg by mouth daily.  03/22/15  Yes Rolland Porter, MD  Cyanocobalamin (VITAMIN B-12) 1000 MCG SUBL Place 1,000 mcg under the tongue daily.   Yes [provider]  levothyroxine (SYNTHROID, LEVOTHROID) 112 MCG tablet Take 2 tablets (224 mcg total) by mouth daily before breakfast. 07/14/16  Yes Renato Shin, MD  LORazepam (ATIVAN) 1 MG tablet Take 1 mg by mouth every 6 (six) hours as needed (for nausea).   Yes [provider]    metoprolol tartrate (LOPRESSOR) 25 MG tablet Take 0.5 tablets (12.5 mg total) by mouth 2 (two) times daily. Patient taking differently: Take 25 mg by mouth daily.  08/17/16 11/15/16 Yes Nahser, Wonda Cheng, MD  Multiple Vitamins-Minerals (MULTIVITAMIN WITH MINERALS) tablet Take 1 tablet by mouth 2 (two) times daily.    Yes [provider]  ULORIC 40 MG tablet TK 1 T PO QD 08/14/16  Yes [provider]    Family History Family History  Problem Relation Age of Onset  . Ovarian cancer Paternal Grandmother   . Diabetes Maternal Grandmother   . Diabetes Mother     Social History Social History  Substance Use Topics  . Smoking status: Never Smoker  . Smokeless tobacco: Never Used  . Alcohol use No     Allergies   Doxycycline; Food; Toradol [ketorolac tromethamine]; Ibuprofen; Indomethacin; Nsaids; Prednisone; and Thiola [tiopronin]   Review of Systems Review of Systems  Gastrointestinal: Positive for abdominal pain.   All other systems reviewed and are negative except that which was mentioned in HPI   Physical Exam Updated Vital Signs BP 115/67 (BP Location: Left Arm)   Pulse 75   Temp 98.3 F (36.8 C) (Oral)   Resp 18   Ht 5\' 4"  (1.626 m)   Wt 83.5 kg (184 lb)   LMP 08/18/2016   SpO2 96%   BMI 31.58 kg/m   Physical Exam  Constitutional: She is oriented to person, place, and time. She appears well-developed and well-nourished. No distress.  HENT:  Head: Normocephalic and atraumatic.  Moist mucous membranes  Eyes: Conjunctivae are normal. Pupils are equal, round, and reactive to light.  Neck: Neck supple.  Cardiovascular: Normal rate, regular rhythm and normal heart sounds.   No murmur heard. Pulmonary/Chest: Effort normal and breath sounds normal.  Abdominal: Soft. Bowel sounds are normal. She exhibits no distension and no mass. There is no tenderness.  Indicates generalized abdominal pain with pointing but no focal tenderness to palpation   Musculoskeletal: She exhibits no edema.  Neurological: She is alert and oriented to person, place, and time.  Fluent speech  Skin: Skin is warm and dry.  Psychiatric: She has a normal mood and affect. Judgment normal.  Nursing note and vitals reviewed.    ED Treatments / Results  Labs (all labs ordered are listed, but only abnormal results are displayed) Labs Reviewed  LIPASE, BLOOD - Abnormal; Notable for the following:       Result Value   Lipase 73 (*)    All other components within normal limits  COMPREHENSIVE METABOLIC PANEL - Abnormal; Notable for the following:    BUN 27 (*)    All other components within normal limits  CBC - Abnormal; Notable for the following:    WBC 10.6 (*)  All other components within normal limits  URINALYSIS, ROUTINE W REFLEX MICROSCOPIC - Abnormal; Notable for the following:    Protein, ur 100 (*)    Squamous Epithelial / LPF 0-5 (*)    All other components within normal limits  POC URINE PREG, ED    EKG  EKG Interpretation None       Radiology No results found.  Procedures Procedures (including critical care time)  Medications Ordered in ED Medications  morphine 2 MG/ML injection 4 mg (not administered)  sodium chloride 0.9 % bolus 1,000 mL (1,000 mLs Intravenous New Bag/Given 08/27/16 2142)  ondansetron (ZOFRAN) injection 4 mg (4 mg Intravenous Given 08/27/16 2142)  fentaNYL (SUBLIMAZE) injection 50 mcg (50 mcg Intravenous Given 08/27/16 2141)  diphenhydrAMINE (BENADRYL) injection 25 mg (25 mg Intravenous Given 08/27/16 2234)     Initial Impression / Assessment and Plan / ED Course  I have reviewed the triage vital signs and the nursing notes.  Pertinent labs & imaging results that were available during my care of the patient were reviewed by me and considered in my medical decision making (see chart for details).     PT w/ Recurrent episodes of vomiting, diarrhea, and abdominal pain with history of gastric sleeve and  recent treatment for H pylori. She was nontoxic at presentation with normal vital signs, afebrile. She pointed to several places in her abdomen as areas of pain but did not have focal tenderness to palpation. Obtained above labs which are notable for lipase elevated at 73, otherwise no evidence of infection, WBC 10.6, unremarkable LFTs and bilirubin. She has had a gallbladder ultrasound in the past during an episode of the same symptoms and ultrasound showed no abnormalities of the gallbladder. Given that she does not have focal right upper quadrant tenderness today and do not feel she needs any imaging currently. I am concerned about her recurrent pancreatitis without obvious cause as she is not a heavy drinker and has been compliant on liquid diet for several days with no improvement. I discussed admission with [facility] hospitalist, Dr. Olevia Bowens, for supportive care of pancreatitis and work up of etiology. PT admitted for further management.  Final Clinical Impressions(s) / ED Diagnoses   Final diagnoses:  Pancreatitis, recurrent ([place])  Nausea vomiting and diarrhea    New Prescriptions New Prescriptions   No medications on file     Little, Wenda Overland, MD 08/27/16 2336

## 2016-08-27 NOTE — ED Triage Notes (Signed)
Pt hx of bariatric surgery 11/19/15 and has multiple episodes of pancreatitis since the surgery. Pt presents with severe abd pain, n/v/d, fever since Tuesday, and foul smelling gas. Pt is having a difficult time keeping PO fluids down.

## 2016-08-27 NOTE — H&P (Signed)
History and Physical    Alison Fitzgerald JJO:[id number] DOB: 1981-04-06 DOA: 08/27/2016  PCP: Antony Contras, MD   Patient coming from: Home.  I have personally briefly reviewed patient's old medical records in [place]  Chief Complaint: Abdominal pain.  HPI: Alison Fitzgerald is a 36 y.o. female with medical history significant of type 2 diabetes, diet controlled, nonalcoholic fatty liver disease, hypothyroidism, urolithiasis, right kidney nephrectomy who underwent a laparoscopic sleeve gastrectomy on 11/19/2015 and has had multiple episodes of pancreatitis since then, now coming to the emergency department with complaints of severe pressure-like abdominal pain since Tuesday, that originates in her RUQ and radiates to her right flank, back and shoulder, associated with decreased appetite, nausea, 4-5 episodes of emesis daily, flatulence and occasional loose stools. The pain is usually triggered by food intake, particularly spicy foods. EGD done last month showed mild gastritis and she was treated for H. pylori infection.  She believes that she had a low-grade temperature at home, but did not measure it. She also complains of difficulty sleeping and lateral to the right knee and rash for the past 3 days. The patient states that she has had this rash in the last 3 episodes of pancreatitis, which usually results after pancreatitis symptoms improved. She denies dyspnea, productive cough, chest pain, palpitations, dizziness, diaphoresis, PND, orthopnea or pitting edema of the lower extremities. She denies dysuria, frequency or hematuria.  ED Course: The patient received a 1000 mL normal saline bolus. Fentanyl 50 g, morphine 4 mg, Benadryl 25 mg and Zofran 4 mg IVP as well. Workup shows urinalysis with mild proteinuria, WBC of 10.6, hemoglobin 12.6 and platelets 340. Her CMP showed a BUN at 27, but all other values were within normal limits. Lipase level was 73 units.   Imaging:  Abdominal ultrasound of right upper quadrant done on 06/12/2016 did not reveal gallstones. CT of the abdomen done in 06/14/2016 did not reveal any acute abnormalities. Please see images and full radiology report for further detail.  Review of Systems: As per HPI otherwise 10 point review of systems negative.    Past Medical History:  Diagnosis Date  . Anemia    only with pregnancy  . Diabetes mellitus without complication ([place])    type 2 diet controlled  . Fatty liver disease, nonalcoholic    improving with weight loss  . Gout   . Hypothyroidism   . Injury of left great toe    due to trauma- area required stitches and stitches are out- DR Kinsinger aware per pateint   . Kidney stones   . PONV (postoperative nausea and vomiting)   . Staghorn kidney stones    right nephrectomy     Past Surgical History:  Procedure Laterality Date  . CESAREAN SECTION     x 1  . cystocopy      multiple times   . ESOPHAGOGASTRODUODENOSCOPY (EGD) WITH PROPOFOL N/A 07/10/2016   Procedure: ESOPHAGOGASTRODUODENOSCOPY (EGD) WITH PROPOFOL;  Surgeon: Mickeal Skinner, MD;  Location: [facility];  Service: General;  Laterality: N/A;  . EXCISION OF ENDOMETRIOMA     removed 2009   . LAPAROSCOPIC GASTRIC SLEEVE RESECTION N/A 11/19/2015   Procedure: LAPAROSCOPIC GASTRIC SLEEVE RESECTION, UPPER ENDO;  Surgeon: Arta Bruce Kinsinger, MD;  Location: [facility];  Service: General;  Laterality: N/A;  . NEPHRECTOMY Right    right 1991 bottob 1999 top half  . PERCUTANEOUS NEPHROSTOMY     multiple times   . TUBAL LIGATION    . URETEROSCOPY  multiple times      reports that she has never smoked. She has never used smokeless tobacco. She reports that she does not drink alcohol or use drugs.  Allergies  Allergen Reactions  . Doxycycline Anaphylaxis  . Food Anaphylaxis    Pt is allergic to jalapeno seeds.   . Toradol [Ketorolac Tromethamine] Hives  . Ibuprofen Other (See Comments)    Pt is unable to take due  to bariatric surgery.    . Indomethacin Other (See Comments)    Pt states that this med caused damage to her kidneys.   . Nsaids Other (See Comments)    Pt is unable to take due to bariatric surgery.    . Prednisone Swelling and Other (See Comments)    Reaction:  Facial swelling   . Thiola [Tiopronin] Other (See Comments)    Reaction:  Bottomed out pts BP    Family History  Problem Relation Age of Onset  . Ovarian cancer Paternal Grandmother   . Diabetes Maternal Grandmother   . Diabetes Mother     Prior to Admission medications   Medication Sig Start Date End Date Taking? Authorizing Provider  Calcium Carbonate-Vitamin D (CALCIUM 600+D) 600-400 MG-UNIT tablet Take 1 tablet by mouth 3 (three) times daily.   Yes [provider]  colchicine 0.6 MG tablet Take 1 tablet (0.6 mg total) by mouth 2 (two) times daily. Patient taking differently: Take 0.6 mg by mouth daily.  03/22/15  Yes Rolland Porter, MD  Cyanocobalamin (VITAMIN B-12) 1000 MCG SUBL Place 1,000 mcg under the tongue daily.   Yes [provider]  levothyroxine (SYNTHROID, LEVOTHROID) 112 MCG tablet Take 2 tablets (224 mcg total) by mouth daily before breakfast. 07/14/16  Yes Renato Shin, MD  LORazepam (ATIVAN) 1 MG tablet Take 1 mg by mouth every 6 (six) hours as needed (for nausea).   Yes [provider]  metoprolol tartrate (LOPRESSOR) 25 MG tablet Take 0.5 tablets (12.5 mg total) by mouth 2 (two) times daily. Patient taking differently: Take 25 mg by mouth daily.  08/17/16 11/15/16 Yes Nahser, Wonda Cheng, MD  Multiple Vitamins-Minerals (MULTIVITAMIN WITH MINERALS) tablet Take 1 tablet by mouth 2 (two) times daily.    Yes [provider]  ULORIC 40 MG tablet TK 1 T PO QD 08/14/16  Yes [provider]    Physical Exam: Vitals:   08/27/16 1902 08/27/16 1916 08/27/16 2213  BP: 119/81  115/67  Pulse: 65  75  Resp: 18  18  Temp: 98.3 F (36.8 C)    TempSrc: Oral    SpO2: 100%  96%    Weight:  83.5 kg (184 lb)   Height:  5\' 4"  (1.626 m)     Constitutional: NAD, calm, comfortable Eyes: PERRL, lids and conjunctivae normal ENMT: Mucous membranes and lips are mildly dry (the patient states that this has happened after she was given medications in the ED). Posterior pharynx clear of any exudate or lesions. Neck: normal, supple, no masses, no thyromegaly Respiratory: clear to auscultation bilaterally, no wheezing, no crackles. Normal respiratory effort. No accessory muscle use.  Cardiovascular: Regular rate and rhythm, no murmurs / rubs / gallops. No extremity edema. 2+ pedal pulses. No carotid bruits.  Abdomen: Soft, mild diffuse tenderness, no guarding/rebound/masses palpated. No hepatosplenomegaly. Bowel sounds positive.  Musculoskeletal: no clubbing / cyanosis. No joint deformity upper and lower extremities. Good ROM, no contractures. Normal muscle tone.  Skin: no rashes, lesions, ulcers. No induration Neurologic: CN 2-12 grossly intact.  Sensation intact, DTR normal. Strength 5/5 in all 4.  Psychiatric: Normal judgment and insight. Alert and oriented x 4. Normal mood.    Labs on Admission: I have personally reviewed following labs and imaging studies  CBC:  Recent Labs Lab 08/27/16 1926  WBC 10.6*  HGB 12.6  HCT 38.3  MCV 91.8  PLT 295   Basic Metabolic Panel:  Recent Labs Lab 08/27/16 1926  NA 138  K 3.9  CL 101  CO2 27  GLUCOSE 95  BUN 27*  CREATININE 1.00  CALCIUM 10.2   GFR: Estimated Creatinine Clearance: 82.1 mL/min (by C-G formula based on SCr of 1 mg/dL). Liver Function Tests:  Recent Labs Lab 08/27/16 1926  AST 16  ALT 14  ALKPHOS 63  BILITOT 0.3  PROT 8.1  ALBUMIN 4.0    Recent Labs Lab 08/27/16 1926  LIPASE 73*   No results for input(s): AMMONIA in the last 168 hours. Coagulation Profile: No results for input(s): INR, PROTIME in the last 168 hours. Cardiac Enzymes: No results for input(s): CKTOTAL, CKMB, CKMBINDEX,  TROPONINI in the last 168 hours. BNP (last 3 results) No results for input(s): PROBNP in the last 8760 hours. HbA1C: No results for input(s): HGBA1C in the last 72 hours. CBG: No results for input(s): GLUCAP in the last 168 hours. Lipid Profile: No results for input(s): CHOL, HDL, LDLCALC, TRIG, CHOLHDL, LDLDIRECT in the last 72 hours. Thyroid Function Tests: No results for input(s): TSH, T4TOTAL, FREET4, T3FREE, THYROIDAB in the last 72 hours. Anemia Panel: No results for input(s): VITAMINB12, FOLATE, FERRITIN, TIBC, IRON, RETICCTPCT in the last 72 hours. Urine analysis:    Component Value Date/Time   COLORURINE YELLOW 08/27/2016 1949   APPEARANCEUR CLEAR 08/27/2016 1949   LABSPEC 1.017 08/27/2016 1949   PHURINE 6.0 08/27/2016 1949   GLUCOSEU NEGATIVE 08/27/2016 1949   HGBUR NEGATIVE 08/27/2016 1949   BILIRUBINUR NEGATIVE 08/27/2016 1949   KETONESUR NEGATIVE 08/27/2016 1949   PROTEINUR 100 (A) 08/27/2016 1949   UROBILINOGEN 1.0 10/28/2014 2247   NITRITE NEGATIVE 08/27/2016 1949   LEUKOCYTESUR NEGATIVE 08/27/2016 1949    Radiological Exams on Admission: No results found.   Assessment/Plan Principal Problem:   Recurrent pancreatitis ([place]) Admit to MedSurg/inpatient. Keep nothing by mouth. Continue IV fluids. Hydromorphone 1 mg every 3 hours as needed for pain. Zofran every 6 hours as needed for nausea. Famotidine 20 mg IVPB every 12 hours. Monitor CBC, CMP and lipase level daily. Recheck RUQ ultrasound in a.m. Consider performing HIDA scan. Consider surgical evaluation if no improvement.  Active Problems:   Diabetes mellitus without complication ([place]) Currently nothing by mouth. CBG monitoring every 6 hours. Continue IV fluids    Hypothyroidism Resume levothyroxine once tolerating oral intake. TSH monitoring as an outpatient.    Dyslipidemia Continue lifestyle modifications.    History of SVT (supraventricular tachycardia) (HCC) Metoprolol 2.5 mg IVP every  6 hours. Resume oral metoprolol once tolerating oral intake.   DVT prophylaxis: SCDs. Code Status: Full code. Family Communication:  Disposition Plan: Admit for bowel rest, analgesics and antiemetics. Consults called:  Admission status: Inpatient/telemetry.   Reubin Milan MD [facility] Pager [phone]  If 7PM-7AM, please contact night-coverage [email or web address] Password [facility]  08/27/2016, 11:37 PM

## 2016-08-28 ENCOUNTER — Ambulatory Visit: Payer: BLUE CROSS/BLUE SHIELD | Admitting: Endocrinology

## 2016-08-28 ENCOUNTER — Inpatient Hospital Stay (HOSPITAL_COMMUNITY): Payer: BLUE CROSS/BLUE SHIELD

## 2016-08-28 DIAGNOSIS — R109 Unspecified abdominal pain: Secondary | ICD-10-CM

## 2016-08-28 DIAGNOSIS — K861 Other chronic pancreatitis: Secondary | ICD-10-CM

## 2016-08-28 LAB — CBC WITH DIFFERENTIAL/PLATELET
BASOS ABS: 0 10*3/uL (ref 0.0–0.1)
Basophils Relative: 0 %
EOS PCT: 6 %
Eosinophils Absolute: 0.6 10*3/uL (ref 0.0–0.7)
HCT: 34.2 % — ABNORMAL LOW (ref 36.0–46.0)
Hemoglobin: 11.3 g/dL — ABNORMAL LOW (ref 12.0–15.0)
LYMPHS PCT: 49 %
Lymphs Abs: 4.5 10*3/uL — ABNORMAL HIGH (ref 0.7–4.0)
MCH: 30.2 pg (ref 26.0–34.0)
MCHC: 33 g/dL (ref 30.0–36.0)
MCV: 91.4 fL (ref 78.0–100.0)
Monocytes Absolute: 0.4 10*3/uL (ref 0.1–1.0)
Monocytes Relative: 5 %
NEUTROS ABS: 3.7 10*3/uL (ref 1.7–7.7)
Neutrophils Relative %: 40 %
PLATELETS: 277 10*3/uL (ref 150–400)
RBC: 3.74 MIL/uL — AB (ref 3.87–5.11)
RDW: 13.5 % (ref 11.5–15.5)
WBC: 9.3 10*3/uL (ref 4.0–10.5)

## 2016-08-28 LAB — COMPREHENSIVE METABOLIC PANEL
ALT: 13 U/L — AB (ref 14–54)
AST: 14 U/L — ABNORMAL LOW (ref 15–41)
Albumin: 3.5 g/dL (ref 3.5–5.0)
Alkaline Phosphatase: 52 U/L (ref 38–126)
Anion gap: 8 (ref 5–15)
BUN: 24 mg/dL — AB (ref 6–20)
CHLORIDE: 105 mmol/L (ref 101–111)
CO2: 27 mmol/L (ref 22–32)
CREATININE: 1.05 mg/dL — AB (ref 0.44–1.00)
Calcium: 8.9 mg/dL (ref 8.9–10.3)
GFR calc Af Amer: >60 mL/min (ref >60)
Glucose, Bld: 85 mg/dL (ref 65–99)
POTASSIUM: 4 mmol/L (ref 3.5–5.1)
Sodium: 140 mmol/L (ref 135–145)
Total Bilirubin: 0.4 mg/dL (ref 0.3–1.2)
Total Protein: 7 g/dL (ref 6.5–8.1)

## 2016-08-28 LAB — MAGNESIUM: Magnesium: 1.9 mg/dL (ref 1.7–2.4)

## 2016-08-28 LAB — HIV ANTIBODY (ROUTINE TESTING W REFLEX): HIV SCREEN 4TH GENERATION: NONREACTIVE

## 2016-08-28 LAB — GLUCOSE, CAPILLARY
GLUCOSE-CAPILLARY: 75 mg/dL (ref 65–99)
GLUCOSE-CAPILLARY: 113 mg/dL — AB (ref 65–99)
Glucose-Capillary: 94 mg/dL (ref 65–99)

## 2016-08-28 LAB — LIPASE, BLOOD: Lipase: 52 U/L — ABNORMAL HIGH (ref 11–51)

## 2016-08-28 LAB — PHOSPHORUS: PHOSPHORUS: 5.6 mg/dL — AB (ref 2.5–4.6)

## 2016-08-28 MED ORDER — DIPHENHYDRAMINE HCL 50 MG/ML IJ SOLN: 25.0000 mg | Freq: Four times a day (QID) | INTRAMUSCULAR | Status: DC | PRN

## 2016-08-28 MED ORDER — ENOXAPARIN SODIUM 40 MG/0.4ML ~~LOC~~ SOLN
40.0000 mg | SUBCUTANEOUS | Status: DC
  Administered 2016-08-28 – 2016-08-29 (×2): 40 mg via SUBCUTANEOUS
  Filled 2016-08-28 (×2): qty 0.4

## 2016-08-28 MED ORDER — MORPHINE SULFATE (PF) 2 MG/ML IV SOLN
INTRAVENOUS | Status: AC
  Filled 2016-08-28: qty 2

## 2016-08-28 MED ORDER — TECHNETIUM TC 99M MEBROFENIN IV KIT
3.0000 | PACK | Freq: Once | INTRAVENOUS | Status: AC | PRN
  Administered 2016-08-28: 3 via INTRAVENOUS

## 2016-08-28 MED ORDER — ACETAMINOPHEN 10 MG/ML IV SOLN
1000.0000 mg | Freq: Four times a day (QID) | INTRAVENOUS | Status: AC | PRN
  Administered 2016-08-28: 1000 mg via INTRAVENOUS
  Filled 2016-08-28: qty 100

## 2016-08-28 MED ORDER — METOPROLOL TARTRATE 5 MG/5ML IV SOLN
2.5000 mg | Freq: Four times a day (QID) | INTRAVENOUS | Status: DC
  Administered 2016-08-28 – 2016-08-30 (×5): 2.5 mg via INTRAVENOUS
  Filled 2016-08-28 (×8): qty 5

## 2016-08-28 MED ORDER — FAMOTIDINE IN NACL 20-0.9 MG/50ML-% IV SOLN
20.0000 mg | Freq: Two times a day (BID) | INTRAVENOUS | Status: DC
  Administered 2016-08-28 – 2016-08-30 (×8): 20 mg via INTRAVENOUS
  Filled 2016-08-28 (×9): qty 50

## 2016-08-28 MED ORDER — HYDROMORPHONE HCL 1 MG/ML IJ SOLN
1.0000 mg | INTRAMUSCULAR | Status: DC | PRN
  Administered 2016-08-28 – 2016-08-31 (×16): 1 mg via INTRAVENOUS
  Filled 2016-08-28 (×17): qty 1

## 2016-08-28 MED ORDER — TECHNETIUM TC 99M MEBROFENIN IV KIT
4.9000 | PACK | Freq: Once | INTRAVENOUS | Status: AC | PRN
  Administered 2016-08-28: 4.9 via INTRAVENOUS

## 2016-08-28 MED ORDER — DEXTROSE-NACL 5-0.9 % IV SOLN
INTRAVENOUS | Status: DC
  Administered 2016-08-28: 11:00:00 via INTRAVENOUS
  Administered 2016-08-28: 1000 mL via INTRAVENOUS
  Administered 2016-08-29: 21:00:00 via INTRAVENOUS
  Administered 2016-08-29: 100 mL/h via INTRAVENOUS

## 2016-08-28 MED ORDER — MORPHINE SULFATE (PF) 2 MG/ML IV SOLN
4.0000 mg | Freq: Once | INTRAVENOUS | Status: AC
  Administered 2016-08-28: 4 mg via INTRAVENOUS

## 2016-08-28 MED ORDER — SODIUM CHLORIDE 0.9 % IV SOLN
INTRAVENOUS | Status: DC
  Administered 2016-08-28: 100 mL/h via INTRAVENOUS

## 2016-08-28 MED ORDER — ENOXAPARIN SODIUM 40 MG/0.4ML ~~LOC~~ SOLN
40.0000 mg | Freq: Every day | SUBCUTANEOUS | Status: DC
  Administered 2016-08-28: 40 mg via SUBCUTANEOUS
  Filled 2016-08-28: qty 0.4

## 2016-08-28 MED ORDER — ONDANSETRON HCL 4 MG/2ML IJ SOLN
4.0000 mg | Freq: Four times a day (QID) | INTRAMUSCULAR | Status: DC | PRN
  Administered 2016-08-28 – 2016-08-29 (×5): 4 mg via INTRAVENOUS
  Filled 2016-08-28 (×5): qty 2

## 2016-08-28 NOTE — Consult Note (Signed)
[facility] Surgery Consult Note  Alison Fitzgerald Jul 30, 1980  [id number].    Requesting MD: [place] Chief Complaint/Reason for Consult: Abdominal pain  HPI:  Alison Fitzgerald is a 36yo female s/p sleeve gastrectomy 11/19/2015 by Dr. Kieth Brightly, who presented to [facility] 5/24 with persistent epigastric pain, nausea, and vomiting. This has been ongoing since surgery and she has had extensive workup with no abnormalities found. She has had an upper GI, abdominal u/s, and abdominal CT scan with contrast all postoperatively with no acute findings. Upper endoscopy on 07/10/16 only showed H pylori for which she was properly treated. The only significant finding on lab results have been mildly elevated lipase (73 on arrival today, down to 52 today).  States that she did well postoperatively until January. Since then she has had intermittent upper abdominal pain, nausea, and vomiting. At times it is RUQ, and other times it is LUQ. This particular episode started Tuesday. Pain was in her RUQ radiating into her back. It was constant, severe, and associated with decreased appetite, nausea, emesis, and diarrhea. This started after eating a salad. Her pain has since transitioned to more LUQ. Denies fever or chills. Patient has a possible history of pancreatitis earlier this year (lipase 66 on 06/14/16), and was admitted yesterday with possible recurrence. States that she has been on a liquid diet since Tuesday, but could not get symptoms to resolve therefore she came to the ED.  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   HENT: Negative.   Eyes: Negative.   Respiratory: Negative.   Cardiovascular: Negative.   Gastrointestinal:  Positive for abdominal pain, nausea and vomiting. Negative for blood in stool, constipation, heartburn and melena.  Genitourinary: Negative.   Musculoskeletal: Negative.   Skin: Negative.   Neurological: Negative.   All systems reviewed and otherwise negative except for as above  Family History  Problem Relation Age of Onset  . Ovarian cancer Paternal Grandmother   . Diabetes Maternal Grandmother   . Diabetes Mother     Past Medical History:  Diagnosis Date  . Anemia    only with pregnancy  . Diabetes mellitus without complication ([place])    type 2 diet controlled  . Fatty liver disease, nonalcoholic    improving with weight loss  . Gout   . Hypothyroidism   . Injury of left great toe    due to trauma- area required stitches and stitches are out- DR Kinsinger aware per pateint   . Kidney stones   . PONV (postoperative nausea and vomiting)   . Staghorn kidney stones    right nephrectomy     Past Surgical History:  Procedure Laterality Date  . CESAREAN SECTION     x 1  . cystocopy      multiple times   . ESOPHAGOGASTRODUODENOSCOPY (EGD) WITH PROPOFOL N/A 07/10/2016   Procedure: ESOPHAGOGASTRODUODENOSCOPY (EGD) WITH PROPOFOL;  Surgeon: Mickeal Skinner, MD;  Location: [facility];  Service: General;  Laterality: N/A;  . EXCISION OF ENDOMETRIOMA     removed 2009   . LAPAROSCOPIC GASTRIC SLEEVE RESECTION N/A 11/19/2015   Procedure: LAPAROSCOPIC GASTRIC SLEEVE RESECTION, UPPER ENDO;  Surgeon: Arta Bruce Kinsinger, MD;  Location: [facility];  Service: General;  Laterality: N/A;  . NEPHRECTOMY Right    right 1991 bottob 1999 top half  . PERCUTANEOUS  NEPHROSTOMY     multiple times   . TUBAL LIGATION    . URETEROSCOPY     multiple times     Social History:  reports that she has never smoked. She has never used smokeless tobacco. She reports that she does not drink alcohol or use drugs.  Allergies:  Allergies  Allergen Reactions  . Doxycycline Anaphylaxis  . Food  Anaphylaxis    Pt is allergic to jalapeno seeds.   . Toradol [Ketorolac Tromethamine] Hives  . Ibuprofen Other (See Comments)    Pt is unable to take due to bariatric surgery.    . Indomethacin Other (See Comments)    Pt states that this med caused damage to her kidneys.   . Nsaids Other (See Comments)    Pt is unable to take due to bariatric surgery.    . Prednisone Swelling and Other (See Comments)    Reaction:  Facial swelling   . Thiola [Tiopronin] Other (See Comments)    Reaction:  Bottomed out pts BP    Medications Prior to Admission  Medication Sig Dispense Refill  . Calcium Carbonate-Vitamin D (CALCIUM 600+D) 600-400 MG-UNIT tablet Take 1 tablet by mouth 3 (three) times daily.    . colchicine 0.6 MG tablet Take 1 tablet (0.6 mg total) by mouth 2 (two) times daily. (Patient taking differently: Take 0.6 mg by mouth daily. ) 30 tablet 0  . Cyanocobalamin (VITAMIN B-12) 1000 MCG SUBL Place 1,000 mcg under the tongue daily.    Marland Kitchen levothyroxine (SYNTHROID, LEVOTHROID) 112 MCG tablet Take 2 tablets (224 mcg total) by mouth daily before breakfast. 60 tablet 2  . LORazepam (ATIVAN) 1 MG tablet Take 1 mg by mouth every 6 (six) hours as needed (for nausea).    . metoprolol tartrate (LOPRESSOR) 25 MG tablet Take 0.5 tablets (12.5 mg total) by mouth 2 (two) times daily. (Patient taking differently: Take 25 mg by mouth daily. ) 90 tablet 3  . Multiple Vitamins-Minerals (MULTIVITAMIN WITH MINERALS) tablet Take 1 tablet by mouth 2 (two) times daily.     Marland Kitchen ULORIC 40 MG tablet TK 1 T PO QD  3    Prior to Admission medications   Medication Sig Start Date End Date Taking? Authorizing Provider  Calcium Carbonate-Vitamin D (CALCIUM 600+D) 600-400 MG-UNIT tablet Take 1 tablet by mouth 3 (three) times daily.   Yes [provider]  colchicine 0.6 MG tablet Take 1 tablet (0.6 mg total) by mouth 2 (two) times daily. Patient taking differently: Take 0.6 mg by mouth daily.  03/22/15  Yes Rolland Porter, MD  Cyanocobalamin (VITAMIN B-12) 1000 MCG SUBL Place 1,000 mcg under the tongue daily.   Yes [provider]  levothyroxine (SYNTHROID, LEVOTHROID) 112 MCG tablet Take 2 tablets (224 mcg total) by mouth daily before breakfast. 07/14/16  Yes Renato Shin, MD  LORazepam (ATIVAN) 1 MG tablet Take 1 mg by mouth every 6 (six) hours as needed (for nausea).   Yes [provider]  metoprolol tartrate (LOPRESSOR) 25 MG tablet Take 0.5 tablets (12.5 mg total) by mouth 2 (two) times daily. Patient taking differently: Take 25 mg by mouth daily.  08/17/16 11/15/16 Yes Nahser, Wonda Cheng, MD  Multiple Vitamins-Minerals (MULTIVITAMIN WITH MINERALS) tablet Take 1 tablet by mouth 2 (two) times daily.    Yes [provider]  ULORIC 40 MG tablet TK 1 T PO QD 08/14/16  Yes [provider]    Blood pressure 95/75, pulse 70, temperature 98.2 F (36.8  C), temperature source Oral, resp. rate 18, height _0  (1.626 m), weight 198 lb 8 oz (90 kg), last menstrual period 08/18/2016, SpO2 97 %. Physical Exam: General: pleasant, WD/WN white female who is laying in bed in NAD HEENT: head is normocephalic, atraumatic.  Sclera are noninjected.  Pupils equal and round.  Ears and nose without any masses or lesions.  Mouth is pink and moist. Dentition fair Heart: regular, rate, and rhythm.  No obvious murmurs, gallops, or rubs noted.  Palpable pedal pulses bilaterally Lungs: CTAB, no wheezes, rhonchi, or rales noted.  Respiratory effort nonlabored Abd: multiple well healed lap incisions, soft, NT, +BS, no masses, hernias, or organomegaly. Moderate LUQ/epigastric TTP, mild RUQ TTP. Negative Murphy sign MS: all 4 extremities are symmetrical with no cyanosis, clubbing, or edema. Skin: warm and dry with no masses, lesions, or rashes Psych: A&Ox3 with an appropriate affect. Neuro: cranial nerves grossly intact, extremity CSM intact bilaterally, normal speech  Results for orders placed or  performed during the hospital encounter of 08/27/16 (from the past 48 hour(s))  Lipase, blood     Status: Abnormal   Collection Time: 08/27/16  7:26 PM  Result Value Ref Range   Lipase 73 (H) 11 - 51 U/L  Comprehensive metabolic panel     Status: Abnormal   Collection Time: 08/27/16  7:26 PM  Result Value Ref Range   Sodium 138 135 - 145 mmol/L   Potassium 3.9 3.5 - 5.1 mmol/L   Chloride 101 101 - 111 mmol/L   CO2 27 22 - 32 mmol/L   Glucose, Bld 95 65 - 99 mg/dL   BUN 27 (H) 6 - 20 mg/dL   Creatinine, Ser 1.00 0.44 - 1.00 mg/dL   Calcium 10.2 8.9 - 10.3 mg/dL   Total Protein 8.1 6.5 - 8.1 g/dL   Albumin 4.0 3.5 - 5.0 g/dL   AST 16 15 - 41 U/L   ALT 14 14 - 54 U/L   Alkaline Phosphatase 63 38 - 126 U/L   Total Bilirubin 0.3 0.3 - 1.2 mg/dL   GFR calc non Af Amer >60 >60 mL/min   GFR calc Af Amer >60 >60 mL/min    Comment: (NOTE) The eGFR has been calculated using the CKD EPI equation. This calculation has not been validated in all clinical situations. eGFR's persistently <60 mL/min signify possible Chronic Kidney Disease.    Anion gap 10 5 - 15  CBC     Status: Abnormal   Collection Time: 08/27/16  7:26 PM  Result Value Ref Range   WBC 10.6 (H) 4.0 - 10.5 K/uL   RBC 4.17 3.87 - 5.11 MIL/uL   Hemoglobin 12.6 12.0 - 15.0 g/dL   HCT 38.3 36.0 - 46.0 %   MCV 91.8 78.0 - 100.0 fL   MCH 30.2 26.0 - 34.0 pg   MCHC 32.9 30.0 - 36.0 g/dL   RDW 13.4 11.5 - 15.5 %   Platelets 340 150 - 400 K/uL  Urinalysis, Routine w reflex microscopic     Status: Abnormal   Collection Time: 08/27/16  7:49 PM  Result Value Ref Range   Color, Urine YELLOW YELLOW   APPearance CLEAR CLEAR   Specific Gravity, Urine 1.017 1.005 - 1.030   pH 6.0 5.0 - 8.0   Glucose, UA NEGATIVE NEGATIVE mg/dL   Hgb urine dipstick NEGATIVE NEGATIVE   Bilirubin Urine NEGATIVE NEGATIVE   Ketones, ur NEGATIVE NEGATIVE mg/dL   Protein, ur 100 (A) NEGATIVE mg/dL   Nitrite  NEGATIVE NEGATIVE   Leukocytes, UA NEGATIVE  NEGATIVE   RBC / HPF 0-5 0 - 5 RBC/hpf   WBC, UA 0-5 0 - 5 WBC/hpf   Bacteria, UA NONE SEEN NONE SEEN   Squamous Epithelial / LPF 0-5 (A) NONE SEEN  Magnesium     Status: None   Collection Time: 08/27/16  7:49 PM  Result Value Ref Range   Magnesium 1.9 1.7 - 2.4 mg/dL  Phosphorus     Status: Abnormal   Collection Time: 08/27/16  7:49 PM  Result Value Ref Range   Phosphorus 5.6 (H) 2.5 - 4.6 mg/dL  POC urine preg, ED     Status: None   Collection Time: 08/27/16  7:55 PM  Result Value Ref Range   Preg Test, Ur NEGATIVE NEGATIVE    Comment:        THE SENSITIVITY OF THIS METHODOLOGY IS >24 mIU/mL   CBC WITH DIFFERENTIAL     Status: Abnormal   Collection Time: 08/28/16  3:45 AM  Result Value Ref Range   WBC 9.3 4.0 - 10.5 K/uL   RBC 3.74 (L) 3.87 - 5.11 MIL/uL   Hemoglobin 11.3 (L) 12.0 - 15.0 g/dL   HCT 34.2 (L) 36.0 - 46.0 %   MCV 91.4 78.0 - 100.0 fL   MCH 30.2 26.0 - 34.0 pg   MCHC 33.0 30.0 - 36.0 g/dL   RDW 13.5 11.5 - 15.5 %   Platelets 277 150 - 400 K/uL   Neutrophils Relative % 40 %   Neutro Abs 3.7 1.7 - 7.7 K/uL   Lymphocytes Relative 49 %   Lymphs Abs 4.5 (H) 0.7 - 4.0 K/uL   Monocytes Relative 5 %   Monocytes Absolute 0.4 0.1 - 1.0 K/uL   Eosinophils Relative 6 %   Eosinophils Absolute 0.6 0.0 - 0.7 K/uL   Basophils Relative 0 %   Basophils Absolute 0.0 0.0 - 0.1 K/uL  Comprehensive metabolic panel     Status: Abnormal   Collection Time: 08/28/16  3:45 AM  Result Value Ref Range   Sodium 140 135 - 145 mmol/L   Potassium 4.0 3.5 - 5.1 mmol/L   Chloride 105 101 - 111 mmol/L   CO2 27 22 - 32 mmol/L   Glucose, Bld 85 65 - 99 mg/dL   BUN 24 (H) 6 - 20 mg/dL   Creatinine, Ser 1.05 (H) 0.44 - 1.00 mg/dL   Calcium 8.9 8.9 - 10.3 mg/dL   Total Protein 7.0 6.5 - 8.1 g/dL   Albumin 3.5 3.5 - 5.0 g/dL   AST 14 (L) 15 - 41 U/L   ALT 13 (L) 14 - 54 U/L   Alkaline Phosphatase 52 38 - 126 U/L   Total Bilirubin 0.4 0.3 - 1.2 mg/dL   GFR calc non Af Amer >60 >60  mL/min   GFR calc Af Amer >60 >60 mL/min    Comment: (NOTE) The eGFR has been calculated using the CKD EPI equation. This calculation has not been validated in all clinical situations. eGFR's persistently <60 mL/min signify possible Chronic Kidney Disease.    Anion gap 8 5 - 15  Lipase, blood     Status: Abnormal   Collection Time: 08/28/16  3:45 AM  Result Value Ref Range   Lipase 52 (H) 11 - 51 U/L  Glucose, capillary     Status: None   Collection Time: 08/28/16  6:21 AM  Result Value Ref Range   Glucose-Capillary 75 65 - 99 mg/dL  No results found.  Anti-infectives    None        Assessment/Plan Persistent abdominal pain, nausea, and vomiting S/p sleeve gastrectomy 11/2015 by Dr. Kieth Brightly - persistent abdominal pain, n/v since January 2018 - upper GI, abdominal u/s, and abdominal CT scan with contrast all postoperatively with no acute findings - Upper endoscopy on 07/10/16 only showed H pylori for which she was properly treated - on arrival: lipase 73, LFTs WNL, WBC 10.6, afebrile  ?Pancreatitis DM- not on medication NASH Hypothyroidism H/o right nephrectomy Nephrolithiasis  ID - none VTE - SCDs, not currently on chemical DVT prophylaxis (if no surgical intervention required, ok to start lovenox from our standpoint) FEN - IVF, NPO  Plan - Recommend HIDA scan for further evaluation of the gallbladder. Will continue to follow.  Jerrye Beavers, [facility] Surgery 08/28/2016, 11:06 AM Pager: [phone] Consults: [phone] Mon-Fri 7:00 am-4:30 pm Sat-Sun 7:00 am-11:30 am

## 2016-08-28 NOTE — Progress Notes (Signed)
PROGRESS NOTE    Alison Fitzgerald  IFO:[id number] DOB: Jan 14, 1981 DOA: 08/27/2016 PCP: Antony Contras, MD   Brief Narrative: Alison Fitzgerald is a 36 y.o. female with medical history significant of type 2 diabetes, diet controlled, nonalcoholic fatty liver disease, hypothyroidism, urolithiasis, right kidney nephrectomy who underwent a laparoscopic sleeve gastrectomy on 11/19/2015 and has had multiple episodes of pancreatitis since then, now coming to the emergency department with complaints of severe pressure-like abdominal pain since Tuesday, that originates in her RUQ and radiates to her right flank, back and shoulder, associated with decreased appetite, nausea, 4-5 episodes of emesis daily, flatulence and occasional loose stools. The pain is usually triggered by food intake, particularly spicy foods. EGD done last month showed mild gastritis and she was treated for H. pylori infection.  She believes that she had a low-grade temperature at home, but did not measure it. She also complains of difficulty sleeping and lateral to the right knee and rash for the past 3 days. The patient states that she has had this rash in the last 3 episodes of pancreatitis, which usually results after pancreatitis symptoms improved. She denies dyspnea, productive cough, chest pain, palpitations, dizziness, diaphoresis, PND, orthopnea or pitting edema of the lower extremities. She denies dysuria, frequency or hematuria.   Assessment & Plan:   Principal Problem:   Recurrent pancreatitis ([place]) Active Problems:   Diabetes mellitus without complication (HCC)   Hypothyroidism   Dyslipidemia   History of SVT (supraventricular tachycardia) (HCC)  1-Abdominal pain, Nausea, vomiting.  Last presentation her lipase was only in the 60, CT abdomen was negative for pancreatitis. I doubt this is pancreatitis.  Could be related to her sleeve gastrectomy. Sx consulted.  Had [place]; negative for gallstone.  Had  endoscopy and was treated for H pylori.  HIDA scan ordered.   History of DM;  Not on medication.  Monitor CBG.   History of SVT; IV metoprolol.  s/p right nephrectomy,   DVT prophylaxis: SCD Code Status: (full code.  Family Communication: care discussed with patient  Disposition Plan: home at time of discharge   Consultants:   Surgery    Procedures  :none   Antimicrobials:  none   Subjective: She report abdominal pain, right side, them radiates to left.  She has been on clear diet since Thursday.   Objective: Vitals:   08/27/16 1916 08/27/16 2213 08/28/16 0011 08/28/16 0502  BP:  115/67 108/62 95/75  Pulse:  75 78 70  Resp:  18 18 18   Temp:   98.5 F (36.9 C) 98.2 F (36.8 C)  TempSrc:   Oral Oral  SpO2:  96% 99% 97%  Weight: 83.5 kg (184 lb)  90 kg (198 lb 8 oz)   Height: 5\' 4"  (1.626 m)  5\' 4"  (1.626 m)     Intake/Output Summary (Last 24 hours) at 08/28/16 1050 Last data filed at 08/28/16 1016  Gross per 24 hour  Intake          1313.33 ml  Output              400 ml  Net           913.33 ml   Filed Weights   08/27/16 1916 08/28/16 0011  Weight: 83.5 kg (184 lb) 90 kg (198 lb 8 oz)    Examination:  General exam: Appears calm and comfortable  Respiratory system: Clear to auscultation. Respiratory effort normal. Cardiovascular system: S1 & S2 heard, RRR. No JVD, murmurs, rubs, gallops or clicks.  No pedal edema. Gastrointestinal system: Abdomen is nondistended, soft and nontender. No organomegaly or masses felt. Normal bowel sounds heard. Central nervous system: Alert and oriented. No focal neurological deficits. Extremities: Symmetric 5 x 5 power. Skin: No rashes, lesions or ulcers Psychiatry: Judgement and insight appear normal. Mood & affect appropriate.     Data Reviewed: I have personally reviewed following labs and imaging studies  CBC:  Recent Labs Lab 08/27/16 1926 08/28/16 0345  WBC 10.6* 9.3  NEUTROABS  --  3.7  HGB 12.6  11.3*  HCT 38.3 34.2*  MCV 91.8 91.4  PLT 340 542   Basic Metabolic Panel:  Recent Labs Lab 08/27/16 1926 08/27/16 1949 08/28/16 0345  NA 138  --  140  K 3.9  --  4.0  CL 101  --  105  CO2 27  --  27  GLUCOSE 95  --  85  BUN 27*  --  24*  CREATININE 1.00  --  1.05*  CALCIUM 10.2  --  8.9  MG  --  1.9  --   PHOS  --  5.6*  --    GFR: Estimated Creatinine Clearance: 81.2 mL/min (A) (by C-G formula based on SCr of 1.05 mg/dL (H)). Liver Function Tests:  Recent Labs Lab 08/27/16 1926 08/28/16 0345  AST 16 14*  ALT 14 13*  ALKPHOS 63 52  BILITOT 0.3 0.4  PROT 8.1 7.0  ALBUMIN 4.0 3.5    Recent Labs Lab 08/27/16 1926 08/28/16 0345  LIPASE 73* 52*   No results for input(s): AMMONIA in the last 168 hours. Coagulation Profile: No results for input(s): INR, PROTIME in the last 168 hours. Cardiac Enzymes: No results for input(s): CKTOTAL, CKMB, CKMBINDEX, TROPONINI in the last 168 hours. BNP (last 3 results) No results for input(s): PROBNP in the last 8760 hours. HbA1C: No results for input(s): HGBA1C in the last 72 hours. CBG:  Recent Labs Lab 08/28/16 0621  GLUCAP 75   Lipid Profile: No results for input(s): CHOL, HDL, LDLCALC, TRIG, CHOLHDL, LDLDIRECT in the last 72 hours. Thyroid Function Tests: No results for input(s): TSH, T4TOTAL, FREET4, T3FREE, THYROIDAB in the last 72 hours. Anemia Panel: No results for input(s): VITAMINB12, FOLATE, FERRITIN, TIBC, IRON, RETICCTPCT in the last 72 hours. Sepsis Labs: No results for input(s): PROCALCITON, LATICACIDVEN in the last 168 hours.  No results found for this or any previous visit (from the past 240 hour(s)).       Radiology Studies: No results found.      Scheduled Meds: . metoprolol tartrate  2.5 mg Intravenous Q6H   Continuous Infusions: . acetaminophen    . dextrose 5 % and 0.9% NaCl    . famotidine (PEPCID) IV Stopped (08/28/16 0223)     LOS: 1 day    Time spent: 25      Elmarie Shiley, MD [facility] Pager [phone]  If 7PM-7AM, please contact night-coverage [email or web address] Password [email or web address] 08/28/2016, 10:50 AM

## 2016-08-28 NOTE — Progress Notes (Signed)
4mg  Mophine IV given for hepaticbilliary scan per Dr. Zigmund Daniel.

## 2016-08-29 DIAGNOSIS — R1013 Epigastric pain: Secondary | ICD-10-CM

## 2016-08-29 LAB — CBC WITH DIFFERENTIAL/PLATELET
BASOS ABS: 0 10*3/uL (ref 0.0–0.1)
Basophils Relative: 0 %
EOS ABS: 0.4 10*3/uL (ref 0.0–0.7)
Eosinophils Relative: 5 %
HCT: 35.2 % — ABNORMAL LOW (ref 36.0–46.0)
HEMOGLOBIN: 11.2 g/dL — AB (ref 12.0–15.0)
LYMPHS ABS: 2.2 10*3/uL (ref 0.7–4.0)
Lymphocytes Relative: 32 %
MCH: 29.3 pg (ref 26.0–34.0)
MCHC: 31.8 g/dL (ref 30.0–36.0)
MCV: 92.1 fL (ref 78.0–100.0)
Monocytes Absolute: 0.3 10*3/uL (ref 0.1–1.0)
Monocytes Relative: 4 %
NEUTROS PCT: 59 %
Neutro Abs: 4 10*3/uL (ref 1.7–7.7)
PLATELETS: 256 10*3/uL (ref 150–400)
RBC: 3.82 MIL/uL — AB (ref 3.87–5.11)
RDW: 13.5 % (ref 11.5–15.5)
WBC: 6.8 10*3/uL (ref 4.0–10.5)

## 2016-08-29 LAB — COMPREHENSIVE METABOLIC PANEL
ALK PHOS: 47 U/L (ref 38–126)
ALT: 12 U/L — AB (ref 14–54)
AST: 13 U/L — AB (ref 15–41)
Albumin: 3.3 g/dL — ABNORMAL LOW (ref 3.5–5.0)
Anion gap: 6 (ref 5–15)
BUN: 19 mg/dL (ref 6–20)
CALCIUM: 8.5 mg/dL — AB (ref 8.9–10.3)
CHLORIDE: 109 mmol/L (ref 101–111)
CO2: 26 mmol/L (ref 22–32)
CREATININE: 1.16 mg/dL — AB (ref 0.44–1.00)
GFR calc non Af Amer: >60 mL/min (ref >60)
Glucose, Bld: 103 mg/dL — ABNORMAL HIGH (ref 65–99)
Potassium: 4.1 mmol/L (ref 3.5–5.1)
Sodium: 141 mmol/L (ref 135–145)
Total Bilirubin: 0.3 mg/dL (ref 0.3–1.2)
Total Protein: 6.4 g/dL — ABNORMAL LOW (ref 6.5–8.1)

## 2016-08-29 LAB — GLUCOSE, CAPILLARY
GLUCOSE-CAPILLARY: 106 mg/dL — AB (ref 65–99)
GLUCOSE-CAPILLARY: 139 mg/dL — AB (ref 65–99)
Glucose-Capillary: 104 mg/dL — ABNORMAL HIGH (ref 65–99)
Glucose-Capillary: 104 mg/dL — ABNORMAL HIGH (ref 65–99)

## 2016-08-29 LAB — TRIGLYCERIDES: Triglycerides: 116 mg/dL (ref <150)

## 2016-08-29 MED ORDER — CEFAZOLIN SODIUM-DEXTROSE 2-4 GM/100ML-% IV SOLN: 2.0000 g | Freq: Once | INTRAVENOUS | Status: DC

## 2016-08-29 MED ORDER — SODIUM CHLORIDE 0.9 % IV SOLN
3.0000 g | Freq: Four times a day (QID) | INTRAVENOUS | Status: DC
  Administered 2016-08-29 – 2016-08-30 (×4): 3 g via INTRAVENOUS
  Filled 2016-08-29 (×5): qty 3

## 2016-08-29 NOTE — Progress Notes (Signed)
Pharmacy Antibiotic Follow-up Note  Alison Fitzgerald is a 36 y.o. year-old female admitted on 08/27/2016.  The patient is currently on day  1 of Unasyn for intra-abd infection.  Assessment/Plan: Unasyn 3gm q6hr  Temp (24hrs), Avg:99.5 F (37.5 C), Min:99.1 F (37.3 C), Max:100 F (37.8 C)   Recent Labs Lab 08/27/16 1926 08/28/16 0345 08/29/16 0517  WBC 10.6* 9.3 6.8    Recent Labs Lab 08/27/16 1926 08/28/16 0345 08/29/16 0517  CREATININE 1.00 1.05* 1.16*   Estimated Creatinine Clearance: 73.5 mL/min (A) (by C-G formula based on SCr of 1.16 mg/dL (H)).    Allergies  Allergen Reactions  . Doxycycline Anaphylaxis  . Food Anaphylaxis    Pt is allergic to jalapeno seeds.   . Toradol [Ketorolac Tromethamine] Hives  . Ibuprofen Other (See Comments)    Pt is unable to take due to bariatric surgery.    . Indomethacin Other (See Comments)    Pt states that this med caused damage to her kidneys.   . Nsaids Other (See Comments)    Pt is unable to take due to bariatric surgery.    . Prednisone Swelling and Other (See Comments)    Reaction:  Facial swelling   . Thiola [Tiopronin] Other (See Comments)    Reaction:  Bottomed out pts BP   Antimicrobials this admission: 5/26 Unasyn >>   Microbiology results: 5/26 MRSA PCR: ordered  Thank you for allowing pharmacy to be a part of this patient's care.  Minda Ditto PharmD 08/29/2016 9:20 AM

## 2016-08-29 NOTE — Progress Notes (Signed)
Subjective/Chief Complaint: Still with some ruq pain radiating to back, hida yesterday   Objective: Vital signs in last 24 hours: Temp:  [99.1 F (37.3 C)-99.3 F (37.4 C)] 99.3 F (37.4 C) (05/26 2703) Pulse Rate:  [80-91] 91 (05/26 0633) Resp:  [20] 20 (05/26 5009) BP: (114-116)/(70-74) 116/70 (05/26 3818) SpO2:  [99 %-100 %] 100 % (05/26 2993) Last BM Date: 08/27/16  Intake/Output from previous day: 05/25 0701 - 05/26 0700 In: 2325 [P.O.:120; I.V.:1955; IV Piggyback:250] Out: 400 [Urine:400] Intake/Output this shift: No intake/output data recorded.  GI no real ruq tenderness on exam, soft well healed incisions   Lab Results:   Recent Labs  08/28/16 0345 08/29/16 0517  WBC 9.3 6.8  HGB 11.3* 11.2*  HCT 34.2* 35.2*  PLT 277 256   BMET  Recent Labs  08/28/16 0345 08/29/16 0517  NA 140 141  K 4.0 4.1  CL 105 109  CO2 27 26  GLUCOSE 85 103*  BUN 24* 19  CREATININE 1.05* 1.16*  CALCIUM 8.9 8.5*   PT/INR No results for input(s): LABPROT, INR in the last 72 hours. ABG No results for input(s): PHART, HCO3 in the last 72 hours.  Invalid input(s): PCO2, PO2  Studies/Results: Nm Hepatobiliary Liver Func  Result Date: 08/28/2016 CLINICAL DATA:  Nausea and vomiting.  Right upper quadrant pain. EXAM: NUCLEAR MEDICINE HEPATOBILIARY IMAGING TECHNIQUE: Sequential images of the abdomen were obtained out to 60 minutes following intravenous administration of radiopharmaceutical. Additional ilium was performed after IV administration of 4 mg of morphine. RADIOPHARMACEUTICALS:  4.9 mCi Tc-39m Choletec IV follow by booster dose of 3.0 millicuries technetium Choletec at the time of morphine administration COMPARISON:  None. FINDINGS: Prompt uptake and biliary excretion of activity by the liver is seen. The gallbladder is not visualized at 90 minutes. The gallbladder was also not visualized after morphine administration with a booster dose of Choletec. IMPRESSION:  Abnormal hepatobiliary scan. No visualization of the gallbladder. Findings are consistent with cholecystitis. Electronically Signed   By: Lorriane Shire M.D.   On: 08/28/2016 16:41    Anti-infectives: Anti-infectives    Start     Dose/Rate Route Frequency Ordered Stop   08/29/16 0745  ceFAZolin (ANCEF) IVPB 2g/100 mL premix     2 g 200 mL/hr over 30 Minutes Intravenous  Once 08/29/16 0741        Assessment/Plan: HD 2 chronic cholecystitis  Has extensive evaluation for n/v and ruq pain radiating to back after sleeve gastrectomy that  I have reviewed.  This appears to be gb clinically, with multiple negative tests and with positive hida scan yesterday. I do not think she has ever had pancreatitis She has taken some ice chips/sips this am.  There is a c diff evaluation going on although this does not sound like this is the case.  Will follow up on medical note later.  I will plan on lap chole tomorrow and discussed this with patient.  Can transfer to our service later today if medicine has no further workup/evaluation needed.   I discussed the procedure in detail.  We discussed the risks and benefits of a laparoscopic cholecystectomy and possible cholangiogram including, but not limited to bleeding, infection, injury to surrounding structures such as the intestine or liver, bile leak, retained gallstones, need to convert to an open procedure, prolonged diarrhea, blood clots such as  DVT, common bile duct injury, anesthesia risks, and possible need for additional procedures.  The likelihood of improvement in symptoms and return to  the patient's normal status is good. We discussed the typical post-operative recovery course.this may be somewhat more difficult due to prior right nephrectomy but this was done first rp incision and then with laparoscope.    [facility] 08/29/2016

## 2016-08-29 NOTE — Anesthesia Preprocedure Evaluation (Addendum)
Anesthesia Evaluation  Patient identified by MRN, date of birth, ID band Patient awake    Reviewed: Allergy & Precautions, NPO status , Patient's Chart, lab work & pertinent test results  History of Anesthesia Complications (+) PONV  Airway Mallampati: II  TM Distance: >3 FB Neck ROM: Full    Dental no notable dental hx.    Pulmonary neg pulmonary ROS,    Pulmonary exam normal breath sounds clear to auscultation       Cardiovascular Pt. on home beta blockers negative cardio ROS Normal cardiovascular exam Rhythm:Regular Rate:Normal  H/o tachycardia ECG: SR, 1st degree AV block. Rate 75 ECHO:Normal LV size with EF 50%, mild diffuse hypokinesis. Normal diastolic function. Normal RV size and systolic function. No significant valvular abnormalities.   Neuro/Psych negative neurological ROS  negative psych ROS   GI/Hepatic negative GI ROS, Fatty liver   Endo/Other  Well ControlledHypothyroidism No meds for DM s/p gastric sleeve  Renal/GU negative Renal ROS  negative genitourinary   Musculoskeletal negative musculoskeletal ROS (+)   Abdominal   Peds negative pediatric ROS (+)  Hematology  (+) anemia ,   Anesthesia Other Findings Obese, BMI 34  Reproductive/Obstetrics negative OB ROS                            Anesthesia Physical Anesthesia Plan  ASA: III  Anesthesia Plan: General   Post-op Pain Management:    Induction: Intravenous  Airway Management Planned: Oral ETT  Additional Equipment:   Intra-op Plan:   Post-operative Plan: Extubation in OR  Informed Consent: I have reviewed the patients History and Physical, chart, labs and discussed the procedure including the risks, benefits and alternatives for the proposed anesthesia with the patient or authorized representative who has indicated his/her understanding and acceptance.   Dental advisory given  Plan Discussed with:  CRNA  Anesthesia Plan Comments:        Anesthesia Quick Evaluation

## 2016-08-29 NOTE — Progress Notes (Addendum)
Nurse paged provider to request CBG order to be changed from every 6 hours to ACHS. Nurse is awaiting return call or orders to be placed.   Per Dr. Lucia Gaskins, leave CBG as every 6 hours due to patients diet as nothing by mouth after midnight.

## 2016-08-29 NOTE — Progress Notes (Signed)
PROGRESS NOTE    Alison Fitzgerald  POE:[id number] DOB: 19-Jun-1980 DOA: 08/27/2016 PCP: Antony Contras, MD   Brief Narrative: Alison Fitzgerald is a 36 y.o. female with medical history significant of type 2 diabetes, diet controlled, nonalcoholic fatty liver disease, hypothyroidism, urolithiasis, right kidney nephrectomy who underwent a laparoscopic sleeve gastrectomy on 11/19/2015 and has had multiple episodes of pancreatitis since then, now coming to the emergency department with complaints of severe pressure-like abdominal pain since Tuesday, that originates in her RUQ and radiates to her right flank, back and shoulder, associated with decreased appetite, nausea, 4-5 episodes of emesis daily, flatulence and occasional loose stools. The pain is usually triggered by food intake, particularly spicy foods. EGD done last month showed mild gastritis and she was treated for H. pylori infection.  She believes that she had a low-grade temperature at home, but did not measure it. She also complains of difficulty sleeping and lateral to the right knee and rash for the past 3 days. The patient states that she has had this rash in the last 3 episodes of pancreatitis, which usually results after pancreatitis symptoms improved. She denies dyspnea, productive cough, chest pain, palpitations, dizziness, diaphoresis, PND, orthopnea or pitting edema of the lower extremities. She denies dysuria, frequency or hematuria.   Assessment & Plan:   Active Problems:   Diabetes mellitus without complication (HCC)   Hypothyroidism   Dyslipidemia   History of SVT (supraventricular tachycardia) (HCC)   Abdominal pain  1-Abdominal pain, Nausea, vomiting.  Last presentation her lipase was only in the 60, CT abdomen was negative for pancreatitis. I doubt this is pancreatitis.  Could be related to her sleeve gastrectomy. Sx consulted.  Had [place]; negative for gallstone.  Had endoscopy and was treated for H pylori.    HIDA positive for cholecystitis.  No further diarrhea, she is constipated now. Unlikely C diff.  Mild fever today. Will start IV antibiotics for cholecystitis.  For cholecystectomy tomorrow.  Surgery will assume patient 's care. Will sign off.    History of DM;  Not on medication.  Monitor CBG.   History of SVT; IV metoprolol.  s/p right nephrectomy,   DVT prophylaxis: SCD Code Status: (full code.  Family Communication: care discussed with patient  Disposition Plan: home at time of discharge   Consultants:   Surgery    Procedures  :none   Antimicrobials:  none   Subjective: Still with abdominal pain, does not feel that she can eat.    Objective: Vitals:   08/28/16 0502 08/28/16 2212 08/29/16 0633 08/29/16 0800  BP: 95/75 114/74 116/70   Pulse: 70 80 91   Resp: 18 20 20    Temp: 98.2 F (36.8 C) 99.1 F (37.3 C) 99.3 F (37.4 C) 100 F (37.8 C)  TempSrc: Oral Oral Oral   SpO2: 97% 99% 100%   Weight:      Height:        Intake/Output Summary (Last 24 hours) at 08/29/16 1052 Last data filed at 08/29/16 3154  Gross per 24 hour  Intake          2284.99 ml  Output                0 ml  Net          2284.99 ml   Filed Weights   08/27/16 1916 08/28/16 0011  Weight: 83.5 kg (184 lb) 90 kg (198 lb 8 oz)    Examination:  General exam: NAD Respiratory system: Clear to auscultation.  Respiratory effort normal. Cardiovascular system: S1 & S2 heard, RRR. No JVD, murmurs, rubs, gallops or clicks. No pedal edema. Gastrointestinal system: soft, mild RUQ and epigastric tenderness.  Central nervous system: non focal  Extremities: no edema     Data Reviewed: I have personally reviewed following labs and imaging studies  CBC:  Recent Labs Lab 08/27/16 1926 08/28/16 0345 08/29/16 0517  WBC 10.6* 9.3 6.8  NEUTROABS  --  3.7 4.0  HGB 12.6 11.3* 11.2*  HCT 38.3 34.2* 35.2*  MCV 91.8 91.4 92.1  PLT 340 277 235   Basic Metabolic Panel:  Recent  Labs Lab 08/27/16 1926 08/27/16 1949 08/28/16 0345 08/29/16 0517  NA 138  --  140 141  K 3.9  --  4.0 4.1  CL 101  --  105 109  CO2 27  --  27 26  GLUCOSE 95  --  85 103*  BUN 27*  --  24* 19  CREATININE 1.00  --  1.05* 1.16*  CALCIUM 10.2  --  8.9 8.5*  MG  --  1.9  --   --   PHOS  --  5.6*  --   --    GFR: Estimated Creatinine Clearance: 73.5 mL/min (A) (by C-G formula based on SCr of 1.16 mg/dL (H)). Liver Function Tests:  Recent Labs Lab 08/27/16 1926 08/28/16 0345 08/29/16 0517  AST 16 14* 13*  ALT 14 13* 12*  ALKPHOS 63 52 47  BILITOT 0.3 0.4 0.3  PROT 8.1 7.0 6.4*  ALBUMIN 4.0 3.5 3.3*    Recent Labs Lab 08/27/16 1926 08/28/16 0345  LIPASE 73* 52*   No results for input(s): AMMONIA in the last 168 hours. Coagulation Profile: No results for input(s): INR, PROTIME in the last 168 hours. Cardiac Enzymes: No results for input(s): CKTOTAL, CKMB, CKMBINDEX, TROPONINI in the last 168 hours. BNP (last 3 results) No results for input(s): PROBNP in the last 8760 hours. HbA1C: No results for input(s): HGBA1C in the last 72 hours. CBG:  Recent Labs Lab 08/28/16 0621 08/28/16 1151 08/28/16 1905 08/29/16 0030 08/29/16 0641  GLUCAP 75 94 113* 106* 104*   Lipid Profile:  Recent Labs  08/29/16 0517  TRIG 116   Thyroid Function Tests: No results for input(s): TSH, T4TOTAL, FREET4, T3FREE, THYROIDAB in the last 72 hours. Anemia Panel: No results for input(s): VITAMINB12, FOLATE, FERRITIN, TIBC, IRON, RETICCTPCT in the last 72 hours. Sepsis Labs: No results for input(s): PROCALCITON, LATICACIDVEN in the last 168 hours.  No results found for this or any previous visit (from the past 240 hour(s)).       Radiology Studies: Nm Hepatobiliary Liver Func  Result Date: 08/28/2016 CLINICAL DATA:  Nausea and vomiting.  Right upper quadrant pain. EXAM: NUCLEAR MEDICINE HEPATOBILIARY IMAGING TECHNIQUE: Sequential images of the abdomen were obtained out to 60  minutes following intravenous administration of radiopharmaceutical. Additional ilium was performed after IV administration of 4 mg of morphine. RADIOPHARMACEUTICALS:  4.9 mCi Tc-10m Choletec IV follow by booster dose of 3.0 millicuries technetium Choletec at the time of morphine administration COMPARISON:  None. FINDINGS: Prompt uptake and biliary excretion of activity by the liver is seen. The gallbladder is not visualized at 90 minutes. The gallbladder was also not visualized after morphine administration with a booster dose of Choletec. IMPRESSION: Abnormal hepatobiliary scan. No visualization of the gallbladder. Findings are consistent with cholecystitis. Electronically Signed   By: Lorriane Shire M.D.   On: 08/28/2016 16:41  Scheduled Meds: . enoxaparin (LOVENOX) injection  40 mg Subcutaneous Q24H  . metoprolol tartrate  2.5 mg Intravenous Q6H   Continuous Infusions: . acetaminophen Stopped (08/28/16 1657)  . ampicillin-sulbactam (UNASYN) IV 3 g (08/29/16 1031)  . [START ON 08/30/2016]  ceFAZolin (ANCEF) IV    . dextrose 5 % and 0.9% NaCl 100 mL/hr (08/29/16 0817)  . famotidine (PEPCID) IV 20 mg (08/29/16 0952)     LOS: 2 days    Time spent: 25     Elmarie Shiley, MD [facility] Pager [phone]  If 7PM-7AM, please contact night-coverage [email or web address] Password [facility] 08/29/2016, 10:52 AM

## 2016-08-30 ENCOUNTER — Inpatient Hospital Stay (HOSPITAL_COMMUNITY): Payer: BLUE CROSS/BLUE SHIELD | Admitting: Certified Registered Nurse Anesthetist

## 2016-08-30 ENCOUNTER — Other Ambulatory Visit: Payer: Self-pay | Admitting: General Surgery

## 2016-08-30 ENCOUNTER — Encounter (HOSPITAL_COMMUNITY): Admission: EM | Disposition: A | Discharge status: Home / Self Care | Payer: Self-pay | Source: Home / Self Care

## 2016-08-30 HISTORY — PX: CHOLECYSTECTOMY: SHX55

## 2016-08-30 LAB — SURGICAL PCR SCREEN
MRSA, PCR: NEGATIVE
Staphylococcus aureus: NEGATIVE

## 2016-08-30 LAB — GLUCOSE, CAPILLARY
GLUCOSE-CAPILLARY: 91 mg/dL (ref 65–99)
GLUCOSE-CAPILLARY: 82 mg/dL (ref 65–99)
Glucose-Capillary: 78 mg/dL (ref 65–99)
Glucose-Capillary: 92 mg/dL (ref 65–99)
Glucose-Capillary: 120 mg/dL — ABNORMAL HIGH (ref 65–99)

## 2016-08-30 SURGERY — LAPAROSCOPIC CHOLECYSTECTOMY WITH INTRAOPERATIVE CHOLANGIOGRAM
Anesthesia: General | Site: Abdomen

## 2016-08-30 MED ORDER — BUPIVACAINE HCL (PF) 0.25 % IJ SOLN
INTRAMUSCULAR | Status: DC | PRN
Start: 2016-08-30 — End: 2016-08-30
  Administered 2016-08-30: 20 mL

## 2016-08-30 MED ORDER — LIDOCAINE 2% (20 MG/ML) 5 ML SYRINGE
INTRAMUSCULAR | Status: DC | PRN
  Administered 2016-08-30: 100 mg via INTRAVENOUS

## 2016-08-30 MED ORDER — SUGAMMADEX SODIUM 200 MG/2ML IV SOLN
INTRAVENOUS | Status: AC
  Filled 2016-08-30: qty 2

## 2016-08-30 MED ORDER — SUGAMMADEX SODIUM 200 MG/2ML IV SOLN
INTRAVENOUS | Status: DC | PRN
  Administered 2016-08-30: 200 mg via INTRAVENOUS

## 2016-08-30 MED ORDER — HYDROCODONE-ACETAMINOPHEN 5-325 MG PO TABS
1.0000 | ORAL_TABLET | ORAL | Status: DC | PRN
  Administered 2016-08-30: 2 via ORAL
  Filled 2016-08-30: qty 2

## 2016-08-30 MED ORDER — FAMOTIDINE IN NACL 20-0.9 MG/50ML-% IV SOLN
20.0000 mg | Freq: Once | INTRAVENOUS | Status: DC
  Filled 2016-08-30: qty 50

## 2016-08-30 MED ORDER — 0.9 % SODIUM CHLORIDE (POUR BTL) OPTIME
TOPICAL | Status: DC | PRN
  Administered 2016-08-30: 1000 mL

## 2016-08-30 MED ORDER — OXYCODONE HCL 5 MG PO TABS: 5.0000 mg | ORAL_TABLET | Freq: Once | ORAL | Status: DC | PRN

## 2016-08-30 MED ORDER — LACTATED RINGERS IR SOLN
Status: DC | PRN
  Administered 2016-08-30: 1000 mL

## 2016-08-30 MED ORDER — LIDOCAINE 2% (20 MG/ML) 5 ML SYRINGE
INTRAMUSCULAR | Status: AC
  Filled 2016-08-30: qty 5

## 2016-08-30 MED ORDER — METOPROLOL TARTRATE 25 MG PO TABS
25.0000 mg | ORAL_TABLET | Freq: Every day | ORAL | Status: DC
  Administered 2016-08-30: 25 mg via ORAL
  Filled 2016-08-30: qty 1

## 2016-08-30 MED ORDER — BUPIVACAINE HCL (PF) 0.25 % IJ SOLN
INTRAMUSCULAR | Status: AC
  Filled 2016-08-30: qty 30

## 2016-08-30 MED ORDER — FENTANYL CITRATE (PF) 250 MCG/5ML IJ SOLN
INTRAMUSCULAR | Status: AC
  Filled 2016-08-30: qty 5

## 2016-08-30 MED ORDER — PHENYLEPHRINE 40 MCG/ML (10ML) SYRINGE FOR IV PUSH (FOR BLOOD PRESSURE SUPPORT)
PREFILLED_SYRINGE | INTRAVENOUS | Status: DC | PRN
  Administered 2016-08-30 (×2): 40 ug via INTRAVENOUS
  Administered 2016-08-30: 80 ug via INTRAVENOUS

## 2016-08-30 MED ORDER — FLUCONAZOLE 100 MG PO TABS
200.0000 mg | ORAL_TABLET | Freq: Once | ORAL | Status: DC
  Filled 2016-08-30 (×2): qty 2

## 2016-08-30 MED ORDER — SUCCINYLCHOLINE CHLORIDE 200 MG/10ML IV SOSY
PREFILLED_SYRINGE | INTRAVENOUS | Status: AC
  Filled 2016-08-30: qty 10

## 2016-08-30 MED ORDER — HYDROMORPHONE HCL 1 MG/ML IJ SOLN: 0.2500 mg | INTRAMUSCULAR | Status: DC | PRN

## 2016-08-30 MED ORDER — FENTANYL CITRATE (PF) 100 MCG/2ML IJ SOLN
INTRAMUSCULAR | Status: DC | PRN
  Administered 2016-08-30 (×5): 50 ug via INTRAVENOUS

## 2016-08-30 MED ORDER — CEFTRIAXONE SODIUM 2 G IJ SOLR
INTRAMUSCULAR | Status: AC
  Filled 2016-08-30: qty 2

## 2016-08-30 MED ORDER — OXYCODONE HCL 5 MG/5ML PO SOLN
5.0000 mg | Freq: Once | ORAL | Status: DC | PRN
  Filled 2016-08-30: qty 5

## 2016-08-30 MED ORDER — LACTATED RINGERS IV SOLN
INTRAVENOUS | Status: DC | PRN
  Administered 2016-08-30: 07:00:00 via INTRAVENOUS

## 2016-08-30 MED ORDER — SUCCINYLCHOLINE CHLORIDE 200 MG/10ML IV SOSY
PREFILLED_SYRINGE | INTRAVENOUS | Status: DC | PRN
  Administered 2016-08-30: 120 mg via INTRAVENOUS

## 2016-08-30 MED ORDER — ENOXAPARIN SODIUM 40 MG/0.4ML ~~LOC~~ SOLN
40.0000 mg | SUBCUTANEOUS | Status: DC
  Administered 2016-08-30: 40 mg via SUBCUTANEOUS
  Filled 2016-08-30: qty 0.4

## 2016-08-30 MED ORDER — SCOPOLAMINE 1 MG/3DAYS TD PT72
MEDICATED_PATCH | TRANSDERMAL | Status: DC | PRN
  Administered 2016-08-30: 1 via TRANSDERMAL

## 2016-08-30 MED ORDER — PROPOFOL 10 MG/ML IV BOLUS
INTRAVENOUS | Status: AC
  Filled 2016-08-30: qty 20

## 2016-08-30 MED ORDER — SCOPOLAMINE 1 MG/3DAYS TD PT72
MEDICATED_PATCH | TRANSDERMAL | Status: AC
  Filled 2016-08-30: qty 1

## 2016-08-30 MED ORDER — PROPOFOL 10 MG/ML IV BOLUS
INTRAVENOUS | Status: DC | PRN
  Administered 2016-08-30: 180 mg via INTRAVENOUS

## 2016-08-30 MED ORDER — LEVOTHYROXINE SODIUM 112 MCG PO TABS
224.0000 ug | ORAL_TABLET | Freq: Every day | ORAL | Status: DC
  Administered 2016-08-31: 224 ug via ORAL
  Filled 2016-08-30 (×2): qty 2

## 2016-08-30 MED ORDER — DEXTROSE 5 % IV SOLN
2.0000 g | INTRAVENOUS | Status: DC
  Administered 2016-08-30: 2 g via INTRAVENOUS
  Filled 2016-08-30: qty 2

## 2016-08-30 MED ORDER — LACTATED RINGERS IV SOLN: INTRAVENOUS | Status: DC

## 2016-08-30 MED ORDER — ROCURONIUM BROMIDE 50 MG/5ML IV SOSY
PREFILLED_SYRINGE | INTRAVENOUS | Status: DC | PRN
  Administered 2016-08-30: 10 mg via INTRAVENOUS
  Administered 2016-08-30: 40 mg via INTRAVENOUS

## 2016-08-30 MED ORDER — ACETAMINOPHEN 10 MG/ML IV SOLN
INTRAVENOUS | Status: DC | PRN
  Administered 2016-08-30: 1000 mg via INTRAVENOUS

## 2016-08-30 MED ORDER — PROMETHAZINE HCL 25 MG/ML IJ SOLN: 6.2500 mg | INTRAMUSCULAR | Status: DC | PRN

## 2016-08-30 MED ORDER — ROCURONIUM BROMIDE 50 MG/5ML IV SOSY
PREFILLED_SYRINGE | INTRAVENOUS | Status: AC
  Filled 2016-08-30: qty 5

## 2016-08-30 MED ORDER — CEFAZOLIN SODIUM-DEXTROSE 2-4 GM/100ML-% IV SOLN
INTRAVENOUS | Status: AC
  Filled 2016-08-30: qty 100

## 2016-08-30 MED ORDER — MIDAZOLAM HCL 2 MG/2ML IJ SOLN
INTRAMUSCULAR | Status: AC
  Filled 2016-08-30: qty 2

## 2016-08-30 MED ORDER — ACETAMINOPHEN 10 MG/ML IV SOLN
INTRAVENOUS | Status: AC
  Filled 2016-08-30: qty 100

## 2016-08-30 MED ORDER — ONDANSETRON HCL 4 MG/2ML IJ SOLN
INTRAMUSCULAR | Status: DC | PRN
  Administered 2016-08-30: 4 mg via INTRAVENOUS

## 2016-08-30 MED ORDER — ONDANSETRON HCL 4 MG/2ML IJ SOLN
INTRAMUSCULAR | Status: AC
  Filled 2016-08-30: qty 2

## 2016-08-30 MED ORDER — MIDAZOLAM HCL 5 MG/5ML IJ SOLN
INTRAMUSCULAR | Status: DC | PRN
  Administered 2016-08-30: 2 mg via INTRAVENOUS

## 2016-08-30 MED ORDER — IOPAMIDOL (ISOVUE-300) INJECTION 61%
INTRAVENOUS | Status: AC
  Filled 2016-08-30: qty 50

## 2016-08-30 SURGICAL SUPPLY — 42 items
APPLIER CLIP 5 13 M/L LIGAMAX5 (MISCELLANEOUS) ×2
APPLIER CLIP ROT 10 11.4 M/L (STAPLE)
BENZOIN TINCTURE PRP APPL 2/3 (GAUZE/BANDAGES/DRESSINGS) IMPLANT
CABLE HIGH FREQUENCY MONO STRZ (ELECTRODE) ×2 IMPLANT
CHLORAPREP W/TINT 26ML (MISCELLANEOUS) ×2 IMPLANT
CLIP APPLIE 5 13 M/L LIGAMAX5 (MISCELLANEOUS) ×1 IMPLANT
CLIP APPLIE ROT 10 11.4 M/L (STAPLE) IMPLANT
CLOSURE STERI-STRIP 1/4X4 (GAUZE/BANDAGES/DRESSINGS) ×2 IMPLANT
COVER MAYO STAND STRL (DRAPES) IMPLANT
COVER SURGICAL LIGHT HANDLE (MISCELLANEOUS) ×2 IMPLANT
DECANTER SPIKE VIAL GLASS SM (MISCELLANEOUS) ×2 IMPLANT
DERMABOND ADVANCED (GAUZE/BANDAGES/DRESSINGS) ×1
DERMABOND ADVANCED .7 DNX12 (GAUZE/BANDAGES/DRESSINGS) ×1 IMPLANT
DEVICE TROCAR PUNCTURE CLOSURE (ENDOMECHANICALS) IMPLANT
DRAPE C-ARM 42X120 X-RAY (DRAPES) IMPLANT
ELECT REM PT RETURN 15FT ADLT (MISCELLANEOUS) ×2 IMPLANT
GLOVE BIO SURGEON STRL SZ7 (GLOVE) ×2 IMPLANT
GLOVE BIOGEL PI IND STRL 7.5 (GLOVE) ×1 IMPLANT
GLOVE BIOGEL PI INDICATOR 7.5 (GLOVE) ×1
GOWN STRL REUS W/TWL LRG LVL3 (GOWN DISPOSABLE) ×2 IMPLANT
GOWN STRL REUS W/TWL XL LVL3 (GOWN DISPOSABLE) ×4 IMPLANT
HEMOSTAT SNOW SURGICEL 2X4 (HEMOSTASIS) IMPLANT
IRRIG SUCT STRYKERFLOW 2 WTIP (MISCELLANEOUS) ×2
IRRIGATION SUCT STRKRFLW 2 WTP (MISCELLANEOUS) ×1 IMPLANT
KIT BASIN OR (CUSTOM PROCEDURE TRAY) ×2 IMPLANT
POSITIONER SURGICAL ARM (MISCELLANEOUS) IMPLANT
POUCH RETRIEVAL ECOSAC 10 (ENDOMECHANICALS) ×1 IMPLANT
POUCH RETRIEVAL ECOSAC 10MM (ENDOMECHANICALS) ×1
SCISSORS LAP 5X35 DISP (ENDOMECHANICALS) ×2 IMPLANT
SET CHOLANGIOGRAPH MIX (MISCELLANEOUS) IMPLANT
SLEEVE XCEL OPT CAN 5 100 (ENDOMECHANICALS) ×4 IMPLANT
STRIP CLOSURE SKIN 1/2X4 (GAUZE/BANDAGES/DRESSINGS) IMPLANT
SUT MNCRL AB 4-0 PS2 18 (SUTURE) ×2 IMPLANT
SUT VICRYL 0 TIES 12 18 (SUTURE) IMPLANT
SUT VICRYL 0 UR6 27IN ABS (SUTURE) ×2 IMPLANT
TOWEL OR 17X26 10 PK STRL BLUE (TOWEL DISPOSABLE) ×2 IMPLANT
TOWEL OR NON WOVEN STRL DISP B (DISPOSABLE) ×2 IMPLANT
TRAY LAPAROSCOPIC (CUSTOM PROCEDURE TRAY) ×2 IMPLANT
TROCAR BLADELESS OPT 5 100 (ENDOMECHANICALS) ×2 IMPLANT
TROCAR XCEL BLUNT TIP 100MML (ENDOMECHANICALS) ×2 IMPLANT
TROCAR XCEL NON-BLD 11X100MML (ENDOMECHANICALS) IMPLANT
TUBING INSUF HEATED (TUBING) ×2 IMPLANT

## 2016-08-30 NOTE — Anesthesia Procedure Notes (Signed)
Procedure Name: Intubation Date/Time: 08/30/2016 7:47 AM Performed by: Montel Clock Pre-anesthesia Checklist: Patient identified, Emergency Drugs available, Suction available, Patient being monitored and Timeout performed Patient Re-evaluated:Patient Re-evaluated prior to inductionOxygen Delivery Method: Circle system utilized Preoxygenation: Pre-oxygenation with 100% oxygen Intubation Type: IV induction Ventilation: Mask ventilation without difficulty Laryngoscope Size: Mac and 3 Grade View: Grade I Tube type: Oral Tube size: 7.5 mm Number of attempts: 1 Airway Equipment and Method: Stylet Placement Confirmation: ETT inserted through vocal cords under direct vision,  positive ETCO2 and breath sounds checked- equal and bilateral Secured at: 21 cm Tube secured with: Tape Dental Injury: Teeth and Oropharynx as per pre-operative assessment  Comments: Grade 1 view with downward laryngeal pressure

## 2016-08-30 NOTE — Op Note (Signed)
Preoperative diagnosis:chronic cholecystitis Postoperative diagnosis: chronic cholecystitis Procedure: Laparoscopic cholecystectomy Surgeon: Dr. Serita Grammes Anesthesia: Gen. Specimens: gb to pathology Estimated blood loss: minimal Complications: None Drains: none Sponge count was correct at completion Disposition to recovery stable  Indications: This is a 77 yof with recent history of sleeve gastrectomy.  She has extensive workup for abdominal pain, nausea and vomiting post surgery that appears this is likely her gallbladder.  We discussed lap chole with likelihood I think it will cure her symptoms.    Procedure: After informed consent was obtained the patient was taken to the operating room. She was given antibiotics. SCDs were in place. She was placed undergeneral anesthesia without complication. Her abdomen was prepped and draped in the standard sterile surgical fashion. A surgical timeout was then performed.  I then infiltrated Marcaine below the umbilicus. I made a vertical incision. I identified the fascia incised sharply. I entered into the peritoneum bluntly. There is no evidence of an entry injury. I then placed a 0 Vicryl pursestring suture. I then inserted a Hassan trocar and insufflated to 15 mmHg pressure. I then placed a 5 mm trocar and epigastrium. 2 5 mm trocars were placed in the right side of the abdomen. I grasped the gallbladder and retracted cephalad. I was able to identify the critical view of safety. The gallbladder was very thickened and difficult to grasp consistent with chronic cholecystitis. I identified the cystic artery. I divided this with two clips remaining in place.  I proceeded to address the cystic duct.I placed three clips and divided this.  The cystic duct was viable and the clips completely traversed the duct. Two clips were left in place.   I then removed the gallbladder from the liver bed. I placed it in a bag and removed from the umbilicus. I obtained  hemostasis. .I placed apiece of Surgicel in the liver bed overlying this. I then removed the [facility] trocar and tied the pursestring down. I did place several additional 2-0 Vicryl sutures at this area. I then removed the remaining trocars and these were closed with 4-0 Monocryl and glue. She tolerated this well be transferred recovery room.

## 2016-08-30 NOTE — Transfer of Care (Signed)
Immediate Anesthesia Transfer of Care Note  Patient: Alison Fitzgerald  Procedure(s) Performed: Procedure(s): LAPAROSCOPIC CHOLECYSTECTOMY (N/A)  Patient Location: PACU  Anesthesia Type:General  Level of Consciousness:  sedated, patient cooperative and responds to stimulation  Airway & Oxygen Therapy:Patient Spontanous Breathing and Patient connected to face mask oxgen  Post-op Assessment:  Report given to PACU RN and Post -op Vital signs reviewed and stable  Post vital signs:  Reviewed and stable  Last Vitals:  Vitals:   08/30/16 0011 08/30/16 0440  BP: 113/78 109/60  Pulse: 78 83  Resp:  16  Temp:  89.8 C    Complications: No apparent anesthesia complications

## 2016-08-30 NOTE — Anesthesia Postprocedure Evaluation (Signed)
Anesthesia Post Note  Patient: Mirely Pangle  Procedure(s) Performed: Procedure(s) (LRB): LAPAROSCOPIC CHOLECYSTECTOMY (N/A)  Patient location during evaluation: PACU Anesthesia Type: General Level of consciousness: awake and alert Pain management: pain level controlled Vital Signs Assessment: post-procedure vital signs reviewed and stable Respiratory status: spontaneous breathing, nonlabored ventilation, respiratory function stable and patient connected to nasal cannula oxygen Cardiovascular status: blood pressure returned to baseline and stable Postop Assessment: no signs of nausea or vomiting Anesthetic complications: no       Last Vitals:  Vitals:   08/30/16 0930 08/30/16 0945  BP: 106/70 109/73  Pulse: 61 63  Resp: 12 12  Temp: 37.1 C 37.1 C    Last Pain:  Vitals:   08/30/16 0915  TempSrc:   PainSc: Asleep                 Ryan P Ellender

## 2016-08-30 NOTE — Interval H&P Note (Signed)
History and Physical Interval Note:  08/30/2016 7:19 AM  Alison Fitzgerald  has presented today for surgery, with the diagnosis of cholecystitis  The various methods of treatment have been discussed with the patient and family. After consideration of risks, benefits and other options for treatment, the patient has consented to  Procedure(s): LAPAROSCOPIC CHOLECYSTECTOMY WITH POSSIBLE INTRAOPERATIVE CHOLANGIOGRAM (N/A) as a surgical intervention .  The patient's history has been reviewed, patient examined, no change in status, stable for surgery.  I have reviewed the patient's chart and labs.  Questions were answered to the patient's satisfaction.     Chelcey Caputo

## 2016-08-30 NOTE — H&P (View-Only) (Signed)
Subjective/Chief Complaint: Still with some ruq pain radiating to back, hida yesterday   Objective: Vital signs in last 24 hours: Temp:  [99.1 F (37.3 C)-99.3 F (37.4 C)] 99.3 F (37.4 C) (05/26 2229) Pulse Rate:  [80-91] 91 (05/26 0633) Resp:  [20] 20 (05/26 7989) BP: (114-116)/(70-74) 116/70 (05/26 2119) SpO2:  [99 %-100 %] 100 % (05/26 4174) Last BM Date: 08/27/16  Intake/Output from previous day: 05/25 0701 - 05/26 0700 In: 2325 [P.O.:120; I.V.:1955; IV Piggyback:250] Out: 400 [Urine:400] Intake/Output this shift: No intake/output data recorded.  GI no real ruq tenderness on exam, soft well healed incisions   Lab Results:   Recent Labs  08/28/16 0345 08/29/16 0517  WBC 9.3 6.8  HGB 11.3* 11.2*  HCT 34.2* 35.2*  PLT 277 256   BMET  Recent Labs  08/28/16 0345 08/29/16 0517  NA 140 141  K 4.0 4.1  CL 105 109  CO2 27 26  GLUCOSE 85 103*  BUN 24* 19  CREATININE 1.05* 1.16*  CALCIUM 8.9 8.5*   PT/INR No results for input(s): LABPROT, INR in the last 72 hours. ABG No results for input(s): PHART, HCO3 in the last 72 hours.  Invalid input(s): PCO2, PO2  Studies/Results: Nm Hepatobiliary Liver Func  Result Date: 08/28/2016 CLINICAL DATA:  Nausea and vomiting.  Right upper quadrant pain. EXAM: NUCLEAR MEDICINE HEPATOBILIARY IMAGING TECHNIQUE: Sequential images of the abdomen were obtained out to 60 minutes following intravenous administration of radiopharmaceutical. Additional ilium was performed after IV administration of 4 mg of morphine. RADIOPHARMACEUTICALS:  4.9 mCi Tc-3m Choletec IV follow by booster dose of 3.0 millicuries technetium Choletec at the time of morphine administration COMPARISON:  None. FINDINGS: Prompt uptake and biliary excretion of activity by the liver is seen. The gallbladder is not visualized at 90 minutes. The gallbladder was also not visualized after morphine administration with a booster dose of Choletec. IMPRESSION:  Abnormal hepatobiliary scan. No visualization of the gallbladder. Findings are consistent with cholecystitis. Electronically Signed   By: Lorriane Shire M.D.   On: 08/28/2016 16:41    Anti-infectives: Anti-infectives    Start     Dose/Rate Route Frequency Ordered Stop   08/29/16 0745  ceFAZolin (ANCEF) IVPB 2g/100 mL premix     2 g 200 mL/hr over 30 Minutes Intravenous  Once 08/29/16 0741        Assessment/Plan: HD 2 chronic cholecystitis  Has extensive evaluation for n/v and ruq pain radiating to back after sleeve gastrectomy that  I have reviewed.  This appears to be gb clinically, with multiple negative tests and with positive hida scan yesterday. I do not think she has ever had pancreatitis She has taken some ice chips/sips this am.  There is a c diff evaluation going on although this does not sound like this is the case.  Will follow up on medical note later.  I will plan on lap chole tomorrow and discussed this with patient.  Can transfer to our service later today if medicine has no further workup/evaluation needed.   I discussed the procedure in detail.  We discussed the risks and benefits of a laparoscopic cholecystectomy and possible cholangiogram including, but not limited to bleeding, infection, injury to surrounding structures such as the intestine or liver, bile leak, retained gallstones, need to convert to an open procedure, prolonged diarrhea, blood clots such as  DVT, common bile duct injury, anesthesia risks, and possible need for additional procedures.  The likelihood of improvement in symptoms and return to  the patient's normal status is good. We discussed the typical post-operative recovery course.this may be somewhat more difficult due to prior right nephrectomy but this was done first rp incision and then with laparoscope.    [facility] 08/29/2016

## 2016-08-30 NOTE — Progress Notes (Signed)
Patient is asking for liquid pain medicine to go home with instead of pills. She said that liquids work best for her since her bariatric surgery. Please make sure that MD is aware of request upon discharge.

## 2016-08-31 LAB — GLUCOSE, CAPILLARY
Glucose-Capillary: 68 mg/dL (ref 65–99)
Glucose-Capillary: 78 mg/dL (ref 65–99)

## 2016-08-31 MED ORDER — OXYCODONE HCL 5 MG/5ML PO SOLN: 7.5000 mg | ORAL | 0 refills | Status: DC | PRN

## 2016-08-31 MED ORDER — OXYCODONE HCL 5 MG/5ML PO SOLN
7.5000 mg | ORAL | Status: DC | PRN
  Administered 2016-08-31: 7.5 mg via ORAL
  Filled 2016-08-31: qty 10

## 2016-08-31 MED ORDER — ACETAMINOPHEN 160 MG/5ML PO SOLN: 650.0000 mg | ORAL | Status: DC | PRN

## 2016-08-31 NOTE — Progress Notes (Signed)
Vital signs stable. Pt was given a protein shake before leaving.  D/C instructions and prescription were given.  Understanding was verbalized. Pt was taken down in a wheelchair where her husband was waiting.

## 2016-08-31 NOTE — Discharge Instructions (Signed)
[facility] LAPAROSCOPIC SURGERY: POST OP INSTRUCTIONS  Always review your discharge instruction sheet given to you by the facility where your surgery was performed. IF YOU HAVE DISABILITY OR FAMILY LEAVE FORMS, YOU MUST BRING THEM TO THE OFFICE FOR PROCESSING.   DO NOT GIVE THEM TO YOUR DOCTOR.  1. A prescription for pain medication may be given to you upon discharge.  Take your pain medication as prescribed, if needed.  If narcotic pain medicine is not needed, then you may take acetaminophen (Tylenol), naprosyn (Alleve), or ibuprofen (Advil) as needed. 2. Take your usually prescribed medications unless otherwise directed. 3. If you need a refill on your pain medication, please contact your pharmacy.  They will contact our office to request authorization. Prescriptions will not be filled after 5pm or on week-ends. 4. You should follow a light diet the first few days after arrival home, such as soup and crackers, etc.  Be sure to include lots of fluids daily. 5. Most patients will experience some swelling and bruising in the area of the incisions.  Ice packs will help.  Swelling and bruising can take several days to resolve.  6. It is common to experience some constipation if taking pain medication after surgery.  Increasing fluid intake and taking a stool softener (such as Colace) will usually help or prevent this problem from occurring.  A mild laxative (Milk of Magnesia or Miralax) should be taken according to package instructions if there are no bowel movements after 48 hours. 7. Unless discharge instructions indicate otherwise, you may remove your bandages 48 hours after surgery, and you may shower at that time.  You may have steri-strips (small skin tapes) in place directly over the incision.  These strips should be left on the skin for 7-10 days.  If your surgeon used skin glue on the incision, you may shower in 24 hours.  The glue will flake  off over the next 2-3 weeks.  Any sutures or staples will be removed at the office during your follow-up visit. 8. ACTIVITIES:  You may resume regular (light) daily activities beginning the next day--such as daily self-care, walking, climbing stairs--gradually increasing activities as tolerated.  You may have sexual intercourse when it is comfortable.  Refrain from any heavy lifting or straining until approved by your doctor. a. You may drive when you are no longer taking prescription pain medication, you can comfortably wear a seatbelt, and you can safely maneuver your car and apply brakes. b. RETURN TO WORK:  __________________________________________________________ 9. You should see your doctor in the office for a follow-up appointment approximately 2-3 weeks after your surgery.  Make sure that you call for this appointment within a day or two after you arrive home to insure a convenient appointment time. 10. OTHER INSTRUCTIONS: __________________________________________________________________________________________________________________________ __________________________________________________________________________________________________________________________ WHEN TO CALL YOUR DOCTOR: 1. Fever over 101.0 2. Inability to urinate 3. Continued bleeding from incision. 4. Increased pain, redness, or drainage from the incision. 5. Increasing abdominal pain  The clinic staff is available to answer your questions during regular business hours.  Please don't hesitate to call and ask to speak to one of the nurses for clinical concerns.  If you have a medical emergency, go to the nearest emergency room or call 911.  A surgeon from [facility] is always on call at the hospital. [address], [address], [place], [place]  [address] ? [address] [address], [place], [place]   [address] [phone] ? [phone] ? [phone]   [phone] Web site: [email or web address]  

## 2016-08-31 NOTE — Discharge Summary (Signed)
Physician Discharge Summary  Patient ID: Alison Fitzgerald MRN: [id number] DOB/AGE: Apr 15, 1980 36 y.o.  Admit date: 08/27/2016 Discharge date: 08/31/2016  Admission Diagnoses: ruq pain, n/v S/p lap sleeve gastrectomy  Discharge Diagnoses:  Active Problems:   Diabetes mellitus without complication (HCC)   Hypothyroidism   Dyslipidemia   History of SVT (supraventricular tachycardia) (HCC)   Abdominal pain   Discharged Condition: good  Hospital Course: 46 yof s/p lap sleeve gastrectomy for MO. She has had extensive workup for abd pain, n/v.  This admission she has positive hida scan and it appears clinically that she has gb disease. Has undergone negative EGD last month also. I discussed proceeding with lap chole as I thought this would likely cure symptoms.  This was uneventful surgery and her gallbladder did have evidence of inflammation. The following day she was doing well and discharged home  Consults: None  Significant Diagnostic Studies: none   Treatments: surgery: lap chole    Disposition: 01-Home or Self Care   Allergies as of 08/31/2016      Reactions   Doxycycline Anaphylaxis   Food Anaphylaxis   Pt is allergic to jalapeno seeds.    Toradol [ketorolac Tromethamine] Hives   Ibuprofen Other (See Comments)   Pt is unable to take due to bariatric surgery.     Indomethacin Other (See Comments)   Pt states that this med caused damage to her kidneys.    Nsaids Other (See Comments)   Pt is unable to take due to bariatric surgery.     Prednisone Swelling, Other (See Comments)   Reaction:  Facial swelling    Thiola [tiopronin] Other (See Comments)   Reaction:  Bottomed out pts BP      Medication List    TAKE these medications   CALCIUM 600+D 600-400 MG-UNIT tablet Generic drug:  Calcium Carbonate-Vitamin D Take 1 tablet by mouth 3 (three) times daily.   colchicine 0.6 MG tablet Take 1 tablet (0.6 mg total) by mouth 2 (two) times daily. What changed:   when to take this   levothyroxine 112 MCG tablet Commonly known as:  SYNTHROID, LEVOTHROID Take 2 tablets (224 mcg total) by mouth daily before breakfast.   LORazepam 1 MG tablet Commonly known as:  ATIVAN Take 1 mg by mouth every 6 (six) hours as needed (for nausea).   metoprolol tartrate 25 MG tablet Commonly known as:  LOPRESSOR Take 0.5 tablets (12.5 mg total) by mouth 2 (two) times daily. What changed:  how much to take  when to take this   multivitamin with minerals tablet Take 1 tablet by mouth 2 (two) times daily.   oxyCODONE 5 MG/5ML solution Commonly known as:  ROXICODONE Take 7.5 mLs (7.5 mg total) by mouth every 4 (four) hours as needed for severe pain.   ULORIC 40 MG tablet Generic drug:  febuxostat TK 1 T PO QD   Vitamin B-12 1000 MCG Subl Place 1,000 mcg under the tongue daily.      Follow-up Information    Kinsinger, Arta Bruce, MD Follow up in 3 week(s).   Specialty:  General Surgery Contact information: [place] [address] [phone]           Signed: Rolm Fitzgerald 08/31/2016, 9:04 AM

## 2016-09-01 ENCOUNTER — Ambulatory Visit: Payer: Self-pay | Admitting: Endocrinology

## 2016-09-01 ENCOUNTER — Encounter (HOSPITAL_COMMUNITY): Payer: Self-pay | Admitting: General Surgery

## 2016-09-07 ENCOUNTER — Encounter (HOSPITAL_COMMUNITY): Payer: Self-pay | Admitting: General Surgery

## 2016-10-13 ENCOUNTER — Emergency Department (HOSPITAL_COMMUNITY)
Admission: EM | Admit: 2016-10-13 | Discharge: 2016-10-14 | Disposition: A | Discharge status: Home / Self Care | Payer: BLUE CROSS/BLUE SHIELD | Attending: Emergency Medicine | Admitting: Emergency Medicine

## 2016-10-13 ENCOUNTER — Emergency Department (HOSPITAL_COMMUNITY): Payer: BLUE CROSS/BLUE SHIELD

## 2016-10-13 ENCOUNTER — Encounter (HOSPITAL_COMMUNITY): Payer: Self-pay

## 2016-10-13 DIAGNOSIS — N23 Unspecified renal colic: Secondary | ICD-10-CM | POA: Diagnosis not present

## 2016-10-13 DIAGNOSIS — E119 Type 2 diabetes mellitus without complications: Secondary | ICD-10-CM | POA: Insufficient documentation

## 2016-10-13 DIAGNOSIS — E039 Hypothyroidism, unspecified: Secondary | ICD-10-CM | POA: Insufficient documentation

## 2016-10-13 DIAGNOSIS — R1031 Right lower quadrant pain: Secondary | ICD-10-CM | POA: Diagnosis present

## 2016-10-13 LAB — I-STAT CHEM 8, ED
BUN: 26 mg/dL — ABNORMAL HIGH (ref 6–20)
CHLORIDE: 101 mmol/L (ref 101–111)
Calcium, Ion: 1.27 mmol/L (ref 1.15–1.40)
Creatinine, Ser: 1 mg/dL (ref 0.44–1.00)
GLUCOSE: 90 mg/dL (ref 65–99)
HEMATOCRIT: 40 % (ref 36.0–46.0)
HEMOGLOBIN: 13.6 g/dL (ref 12.0–15.0)
POTASSIUM: 4.5 mmol/L (ref 3.5–5.1)
SODIUM: 138 mmol/L (ref 135–145)
TCO2: 28 mmol/L (ref 0–100)

## 2016-10-13 LAB — URINALYSIS, ROUTINE W REFLEX MICROSCOPIC
Bilirubin Urine: NEGATIVE
Glucose, UA: NEGATIVE mg/dL
Hgb urine dipstick: NEGATIVE
KETONES UR: NEGATIVE mg/dL
Leukocytes, UA: NEGATIVE
Nitrite: NEGATIVE
PROTEIN: 100 mg/dL — AB
Specific Gravity, Urine: 1.018 (ref 1.005–1.030)
pH: 6 (ref 5.0–8.0)

## 2016-10-13 MED ORDER — ONDANSETRON HCL 4 MG/2ML IJ SOLN
4.0000 mg | Freq: Once | INTRAMUSCULAR | Status: AC
  Administered 2016-10-13: 4 mg via INTRAVENOUS
  Filled 2016-10-13: qty 2

## 2016-10-13 MED ORDER — HYDROCODONE-ACETAMINOPHEN 5-325 MG PO TABS: 1.0000 | ORAL_TABLET | ORAL | 0 refills | Status: DC | PRN

## 2016-10-13 MED ORDER — FENTANYL CITRATE (PF) 100 MCG/2ML IJ SOLN
50.0000 ug | Freq: Once | INTRAMUSCULAR | Status: AC
  Administered 2016-10-13: 50 ug via INTRAVENOUS
  Filled 2016-10-13: qty 2

## 2016-10-13 MED ORDER — HYDROCODONE-ACETAMINOPHEN 5-325 MG PO TABS
1.0000 | ORAL_TABLET | Freq: Once | ORAL | Status: AC
  Administered 2016-10-13: 1 via ORAL
  Filled 2016-10-13: qty 1

## 2016-10-13 NOTE — ED Triage Notes (Signed)
Patient states she passed a stone 2 days ago. Today at 0330 she passed stone fragments. Patient states she has been having hematuria. Patient tearful. Patient states she had to have a right nephrectomy due to stones. Patient also c/o nausea

## 2016-10-13 NOTE — ED Provider Notes (Signed)
[facility] DEPT Provider Note   CSN: [id number] Arrival date & time: 10/13/16  1809     History   Chief Complaint Chief Complaint  Patient presents with  . Flank Pain  . Nausea    HPI Alison Fitzgerald is a 36 y.o. female.  Patient is a 36 year old female with a history of gastric sleeve surgery, recent cholecystectomy and recurrent kidney stones with a prior staph according calculus in the right kidney which resulted in a right nephrectomy. She states that she passes little stones all the time but over the last 2 days she's had worsening pain in her left flank. She's passed larger stones that she typically does. She's had some associated nausea and vomiting. No fevers. She feels like she is not urinating as easily as she typically does. She's followed by [facility]. She took some pain medication that she had at home from her prior cholecystectomy which helped the pain somewhat.      Past Medical History:  Diagnosis Date  . Anemia    only with pregnancy  . Diabetes mellitus without complication ([place])    type 2 diet controlled  . Fatty liver disease, nonalcoholic    improving with weight loss  . Gout   . Hypothyroidism   . Injury of left great toe    due to trauma- area required stitches and stitches are out- DR Kinsinger aware per pateint   . Kidney stones   . PONV (postoperative nausea and vomiting)   . Staghorn kidney stones    right nephrectomy     Patient Active Problem List   Diagnosis Date Noted  . Abdominal pain 08/28/2016  . Recurrent pancreatitis ([place]) 08/27/2016  . Chest pain 07/24/2016  . Syncope 07/24/2016  . Palpitations 07/24/2016  . Hypoglycemia 12/24/2015  . Morbid obesity ([place]) 11/19/2015  . History of SVT (supraventricular tachycardia) ([place]) 02/18/2015  . PCO (polycystic ovaries) 01/29/2015  . Dyslipidemia 01/29/2015  . Diabetes mellitus without complication ([place])   . Hypothyroidism   . Staghorn kidney stones     Past  Surgical History:  Procedure Laterality Date  . CESAREAN SECTION     x 1  . CHOLECYSTECTOMY N/A 08/30/2016   Procedure: LAPAROSCOPIC CHOLECYSTECTOMY;  Surgeon: Rolm Bookbinder, MD;  Location: [facility];  Service: General;  Laterality: N/A;  . cystocopy      multiple times   . ESOPHAGOGASTRODUODENOSCOPY (EGD) WITH PROPOFOL N/A 07/10/2016   Procedure: ESOPHAGOGASTRODUODENOSCOPY (EGD) WITH PROPOFOL;  Surgeon: Mickeal Skinner, MD;  Location: [facility];  Service: General;  Laterality: N/A;  . EXCISION OF ENDOMETRIOMA     removed 2009   . LAPAROSCOPIC GASTRIC SLEEVE RESECTION N/A 11/19/2015   Procedure: LAPAROSCOPIC GASTRIC SLEEVE RESECTION, UPPER ENDO;  Surgeon: Arta Bruce Kinsinger, MD;  Location: [facility];  Service: General;  Laterality: N/A;  . NEPHRECTOMY Right    right 1991 bottob 1999 top half  . PERCUTANEOUS NEPHROSTOMY     multiple times   . TUBAL LIGATION    . URETEROSCOPY     multiple times     OB History    No data available       Home Medications    Prior to Admission medications   Medication Sig Start Date End Date Taking? Authorizing Provider  Calcium Carbonate-Vitamin D (CALCIUM 600+D) 600-400 MG-UNIT tablet Take 1 tablet by mouth 3 (three) times daily.   Yes [provider]  colchicine 0.6 MG tablet Take 1 tablet (0.6 mg total) by mouth 2 (two) times daily.  Patient taking differently: Take 0.6 mg by mouth daily.  03/22/15  Yes Rolland Porter, MD  Cyanocobalamin (VITAMIN B-12) 1000 MCG SUBL Place 1,000 mcg under the tongue daily.   Yes [provider]  levothyroxine (SYNTHROID, LEVOTHROID) 112 MCG tablet Take 2 tablets (224 mcg total) by mouth daily before breakfast. 07/14/16  Yes Renato Shin, MD  metoprolol tartrate (LOPRESSOR) 25 MG tablet Take 0.5 tablets (12.5 mg total) by mouth 2 (two) times daily. Patient taking differently: Take 25 mg by mouth daily.  08/17/16 11/15/16 Yes Nahser, Wonda Cheng, MD  Multiple Vitamins-Minerals (MULTIVITAMIN WITH  MINERALS) tablet Take 1 tablet by mouth 2 (two) times daily.    Yes [provider]  oxyCODONE (ROXICODONE) 5 MG/5ML solution Take 7.5 mLs (7.5 mg total) by mouth every 4 (four) hours as needed for severe pain. 08/31/16  Yes Rolm Bookbinder, MD  ULORIC 40 MG tablet TK 1 T PO QD 08/14/16  Yes [provider]  HYDROcodone-acetaminophen (NORCO/VICODIN) 5-325 MG tablet Take 1-2 tablets by mouth every 4 (four) hours as needed. 10/13/16   Malvin Johns, MD  LORazepam (ATIVAN) 1 MG tablet Take 1 mg by mouth every 6 (six) hours as needed (for nausea).    [provider]    Family History Family History  Problem Relation Age of Onset  . Ovarian cancer Paternal Grandmother   . Diabetes Maternal Grandmother   . Diabetes Mother     Social History Social History  Substance Use Topics  . Smoking status: Never Smoker  . Smokeless tobacco: Never Used  . Alcohol use No     Allergies   Doxycycline; Food; Toradol [ketorolac tromethamine]; Ibuprofen; Indomethacin; Nsaids; Prednisone; and Thiola [tiopronin]   Review of Systems Review of Systems  Constitutional: Negative for chills, diaphoresis, fatigue and fever.  HENT: Negative for congestion, rhinorrhea and sneezing.   Eyes: Negative.   Respiratory: Negative for cough, chest tightness and shortness of breath.   Cardiovascular: Negative for chest pain and leg swelling.  Gastrointestinal: Positive for abdominal pain, nausea and vomiting. Negative for blood in stool and diarrhea.  Genitourinary: Positive for dysuria. Negative for difficulty urinating, flank pain, frequency and hematuria.  Musculoskeletal: Positive for back pain. Negative for arthralgias.  Skin: Negative for rash.  Neurological: Negative for dizziness, speech difficulty, weakness, numbness and headaches.     Physical Exam Updated Vital Signs BP 116/82 (BP Location: Right Arm)   Pulse 91   Temp 98.7 F (37.1 C) (Oral)   Resp 17   Ht 5\' 4"  (1.626  m)   Wt 87.1 kg (192 lb)   LMP 10/06/2016   SpO2 98%   BMI 32.96 kg/m   Physical Exam  Constitutional: She is oriented to person, place, and time. She appears well-developed and well-nourished.  HENT:  Head: Normocephalic and atraumatic.  Eyes: Pupils are equal, round, and reactive to light.  Neck: Normal range of motion. Neck supple.  Cardiovascular: Normal rate, regular rhythm and normal heart sounds.   Pulmonary/Chest: Effort normal and breath sounds normal. No respiratory distress. She has no wheezes. She has no rales. She exhibits no tenderness.  Abdominal: Soft. Bowel sounds are normal. There is tenderness (Mild tenderness to the left mid and upper abdomen, no CVA tenderness). There is no rebound and no guarding.  Musculoskeletal: Normal range of motion. She exhibits no edema.  Lymphadenopathy:    She has no cervical adenopathy.  Neurological: She is alert and oriented to person, place, and time.  Skin: Skin is warm  and dry. No rash noted.  Psychiatric: She has a normal mood and affect.     ED Treatments / Results  Labs (all labs ordered are listed, but only abnormal results are displayed) Labs Reviewed  URINALYSIS, ROUTINE W REFLEX MICROSCOPIC - Abnormal; Notable for the following:       Result Value   Protein, ur 100 (*)    Bacteria, UA RARE (*)    Squamous Epithelial / LPF 6-30 (*)    All other components within normal limits  I-STAT CHEM 8, ED - Abnormal; Notable for the following:    BUN 26 (*)    All other components within normal limits    EKG  EKG Interpretation None       Radiology US Renal  Result Date: 10/13/2016 CLINICAL DATA:  36 year old female with history of diabetes and staghorn calculus and right-sided nephrectomy presenting with left flank pain. EXAM: RENAL / URINARY TRACT ULTRASOUND COMPLETE COMPARISON:  Abdominal CT dated 06/14/2016 FINDINGS: Right Kidney: Surgically absent. There is a 4.6 x 4.2 x 3.6 cm cystic structure in the right  nephrectomy bed as seen on the prior CT. Left Kidney: Length: 14.5 cm. Normal echogenicity. There is no hydronephrosis or echogenic stone. There is atrophic appearance of the inferior pole of the left kidney as seen on the prior CT, likely related to chronic infection and scarring. Bladder: Appears normal for degree of bladder distention. IMPRESSION: 1. No hydronephrosis or echogenic stone on the left. 2. Status post prior right nephrectomy. Stable cystic structure at the nephrectomy bed as seen on the prior CT. Electronically Signed   By: Anner Crete M.D.   On: 10/13/2016 21:55    Procedures Procedures (including critical care time)  Medications Ordered in ED Medications  fentaNYL (SUBLIMAZE) injection 50 mcg (50 mcg Intravenous Given 10/13/16 2109)  ondansetron (ZOFRAN) injection 4 mg (4 mg Intravenous Given 10/13/16 2108)  HYDROcodone-acetaminophen (NORCO/VICODIN) 5-325 MG per tablet 1 tablet (1 tablet Oral Given 10/13/16 2336)     Initial Impression / Assessment and Plan / ED Course  I have reviewed the triage vital signs and the nursing notes.  Pertinent labs & imaging results that were available during my care of the patient were reviewed by me and considered in my medical decision making (see chart for details).     Patient is a 36 year old female with a history of recurrent kidney stones who presents with left flank pain. She doesn't really have a lot of tenderness on exam but she appears to be uncomfortable. She's had a fair amount of CT scans in the past so I did a renal ultrasound which does not show evidence of hydronephrosis. There is no evidence of urinary tract infection. Her creatinine is normal. She appears to be comfortable after medication in the ED. She was discharged home in good condition. She has an appointment to follow-up with [facility]. Return precautions were given.  Final Clinical Impressions(s) / ED Diagnoses   Final diagnoses:  Renal colic    New  Prescriptions New Prescriptions   HYDROCODONE-ACETAMINOPHEN (NORCO/VICODIN) 5-325 MG TABLET    Take 1-2 tablets by mouth every 4 (four) hours as needed.     Malvin Johns, MD 10/13/16 [phone]

## 2016-10-16 NOTE — Anesthesia Postprocedure Evaluation (Signed)
Anesthesia Post Note  Patient: Alison Fitzgerald  Procedure(s) Performed: Procedure(s) (LRB): ESOPHAGOGASTRODUODENOSCOPY (EGD) WITH PROPOFOL (N/A)     Anesthesia Post Evaluation  Last Vitals:  Vitals:   07/10/16 1350 07/10/16 1400  BP: 106/68 102/61  Pulse: 67 72  Resp: 13 14  Temp:      Last Pain:  Vitals:   07/10/16 1331  TempSrc: Oral                 Saloma Cadena EDWARD

## 2016-10-16 NOTE — Addendum Note (Signed)
Addendum  created 10/16/16 1103 by Lyndle Herrlich, MD   Sign clinical note

## 2016-11-17 ENCOUNTER — Ambulatory Visit: Payer: BLUE CROSS/BLUE SHIELD | Admitting: Cardiovascular Disease

## 2016-11-24 ENCOUNTER — Telehealth: Payer: Self-pay | Admitting: Endocrinology

## 2016-11-24 ENCOUNTER — Other Ambulatory Visit: Payer: Self-pay

## 2016-11-24 MED ORDER — LEVOTHYROXINE SODIUM 112 MCG PO TABS: 224.0000 ug | ORAL_TABLET | Freq: Every day | ORAL | 2 refills | Status: DC

## 2016-11-24 NOTE — Telephone Encounter (Addendum)
Normal Please continue the same medication. Ok to refill

## 2016-11-24 NOTE — Telephone Encounter (Signed)
Patient called to inquire about the change in her medication levothyroxine (SYNTHROID, LEVOTHROID) 112 MCG tablet due to her PCP checking her thyroid levels and faxing the results over last week to the office. Call patient to advise and discuss an alternative. Call home or mobile, okay to leave a detailed message.

## 2016-11-24 NOTE — Telephone Encounter (Signed)
Please advise. Thanks.  

## 2016-12-10 ENCOUNTER — Other Ambulatory Visit: Payer: Self-pay | Admitting: Endocrinology

## 2016-12-10 NOTE — Telephone Encounter (Signed)
Levothyroxine 14mcg [place] on back order. Would you like to change to brand?

## 2016-12-10 NOTE — Telephone Encounter (Signed)
OK 

## 2016-12-11 ENCOUNTER — Other Ambulatory Visit: Payer: Self-pay

## 2016-12-11 MED ORDER — LEVOTHYROXINE SODIUM 112 MCG PO TABS: 224.0000 ug | ORAL_TABLET | Freq: Every day | ORAL | 0 refills | Status: AC

## 2016-12-14 ENCOUNTER — Ambulatory Visit (INDEPENDENT_AMBULATORY_CARE_PROVIDER_SITE_OTHER): Payer: BLUE CROSS/BLUE SHIELD | Admitting: Cardiovascular Disease

## 2016-12-14 ENCOUNTER — Encounter: Payer: Self-pay | Admitting: Cardiovascular Disease

## 2016-12-14 VITALS — BP 102/68 | HR 78 | Ht 64.0 in | Wt 201.8 lb

## 2016-12-14 DIAGNOSIS — R0609 Other forms of dyspnea: Secondary | ICD-10-CM | POA: Diagnosis not present

## 2016-12-14 DIAGNOSIS — R000 Tachycardia, unspecified: Secondary | ICD-10-CM | POA: Diagnosis not present

## 2016-12-14 NOTE — Patient Instructions (Addendum)
Medication Instructions:  Your physician recommends that you continue on your current medications as directed. Please refer to the Current Medication list given to you today.   Labwork: None Ordered   Testing/Procedures: Your physician has requested that you have a stress echocardiogram. For further information please visit [email or web address]. Please follow instruction sheet as given.  **Take metoprolol for your stress test  Follow-Up: Your physician wants you to follow-up in: 6 months with Dr. Acie Fredrickson.  You will receive a reminder letter in the mail two months in advance. If you don't receive a letter, please call our office to schedule the follow-up appointment.   If you need a refill on your cardiac medications before your next appointment, please call your pharmacy.   Thank you for choosing [facility]! Christen Bame, RN [phone]

## 2016-12-14 NOTE — Progress Notes (Signed)
Cardiology Office Note   Date:  12/14/2016   ID:  Alison Fitzgerald, DOB 08-09-1980, MRN [id number]  PCP:  Antony Contras, MD  Cardiologist:   Mertie Moores, MD   Chief Complaint  Patient presents with  . Follow-up    chest pain , SVT   Problem list 1. Palpitations 2. Diabetes mellitus 3. PCOS 4. Hypothyroidism 5. Congential coronary artery disease:   Essentially Single coronary artery with a small posterior LCx by patients report.    History of Present Illness: Alison Fitzgerald is a 36 y.o. female who presents for palpitation .  Records from [facility] - Dr. Moreen Fowler , are reviewed.   Has a hx of congenital Coronary artery disease.   Had a cath in 2009. Was essentially found to have a single coronary artery .    Has been having some chest pain / dyspnea  Severe CP recently ,  Pain comes and goes.   Has palpitations that wake her up at night . Associated with some dyspnea  Has had some tachypalps - a co-worker measured her HR at 201 several weeks ago  That episode was associated with lightheadedness and chest pressure.   Episodes can last as long as 1 1/2 hours.  Typically last 45 min - 1 hour.    Recent glucose levels are poorly controlled.   - HbA1C > 10. Does not exercise Works 2 jobs - works for [employer] and also works at [employer] .   June 06, 2015 Alison Fitzgerald is seen today for a follow up of her palpitations and CAD .   She has congenital coronary artery anomalies ( reportedly has a single coronary artery with a very small posterior lateral artery that has it's own origen - found at CT angio of the heart 0)   She has diabetes that has historically been poorly controlled.  We started her on metoprolol at her last visit The metoprolol seems to be helping  Has been limiting her caffiene intake   July 24, 2016:  Alison Fitzgerald is seen for follow-up of her congenital coronary artery anomalies and supraventricular tachycardia. She was recent seen in the emergency  room for an episode of syncope. She lost consciousness for several minutes.  She had a gastric sleeve surgery last august 2 days ago, while at church, she had chest pain , tightness, Nausea, radiation to the back and pain down her left arm .  Crushing chest pain  Left church and walked home  Passed out for several minutes.   Struggling to breath according to her friend Quin Hoop) who was with her. No seizure like activity .  CT angio of chest was negative for PE.   Sept. 10, 2018 Alison Fitzgerald is seen today for follow up .  Echo done in May shows EF 50%. Myoview showed no ischemia and EF of 48%.  She has been feeling OK. Has not had any syncopal episodes - has had some pre-syncope episodes ( while singing at church, gets very hot)  Is taking Metoprolol 12. 5 BID    Past Medical History:  Diagnosis Date  . Anemia    only with pregnancy  . Diabetes mellitus without complication ([place])    type 2 diet controlled  . Fatty liver disease, nonalcoholic    improving with weight loss  . Gout   . Hypothyroidism   . Injury of left great toe    due to trauma- area required stitches and stitches are out- DR Kinsinger aware per pateint   .  Kidney stones   . PONV (postoperative nausea and vomiting)   . Staghorn kidney stones    right nephrectomy     Past Surgical History:  Procedure Laterality Date  . CESAREAN SECTION     x 1  . CHOLECYSTECTOMY N/A 08/30/2016   Procedure: LAPAROSCOPIC CHOLECYSTECTOMY;  Surgeon: Rolm Bookbinder, MD;  Location: [facility];  Service: General;  Laterality: N/A;  . cystocopy      multiple times   . ESOPHAGOGASTRODUODENOSCOPY (EGD) WITH PROPOFOL N/A 07/10/2016   Procedure: ESOPHAGOGASTRODUODENOSCOPY (EGD) WITH PROPOFOL;  Surgeon: Mickeal Skinner, MD;  Location: [facility];  Service: General;  Laterality: N/A;  . EXCISION OF ENDOMETRIOMA     removed 2009   . LAPAROSCOPIC GASTRIC SLEEVE RESECTION N/A 11/19/2015   Procedure: LAPAROSCOPIC GASTRIC SLEEVE  RESECTION, UPPER ENDO;  Surgeon: Arta Bruce Kinsinger, MD;  Location: [facility];  Service: General;  Laterality: N/A;  . NEPHRECTOMY Right    right 1991 bottob 1999 top half  . PERCUTANEOUS NEPHROSTOMY     multiple times   . TUBAL LIGATION    . URETEROSCOPY     multiple times      Current Outpatient Prescriptions  Medication Sig Dispense Refill  . Calcium Carbonate-Vitamin D (CALCIUM 600+D) 600-400 MG-UNIT tablet Take 1 tablet by mouth 3 (three) times daily.    . colchicine 0.6 MG tablet Take 0.6 mg by mouth daily.    . Cyanocobalamin (VITAMIN B-12) 1000 MCG SUBL Place 1,000 mcg under the tongue daily.    . febuxostat (ULORIC) 40 MG tablet Take 40 mg by mouth daily.    Marland Kitchen levothyroxine (SYNTHROID, LEVOTHROID) 112 MCG tablet Take 2 tablets (224 mcg total) by mouth daily before breakfast. 60 tablet 0  . metoprolol tartrate (LOPRESSOR) 25 MG tablet Take 12.5 mg by mouth 2 (two) times daily.     . Multiple Vitamins-Minerals (MULTIVITAMIN WITH MINERALS) tablet Take 1 tablet by mouth 2 (two) times daily.      No current facility-administered medications for this visit.     Allergies:   Doxycycline; Food; Toradol [ketorolac tromethamine]; Ibuprofen; Indomethacin; Nsaids; Prednisone; and Thiola [tiopronin]    Social History:  The patient  reports that she has never smoked. She has never used smokeless tobacco. She reports that she does not drink alcohol or use drugs.   Family History:  The patient's family history includes Diabetes in her maternal grandmother and mother; Ovarian cancer in her paternal grandmother.    ROS:  Please see the history of present illness.    Review of Systems: Constitutional:  denies fever, chills, diaphoresis, appetite change and fatigue.  HEENT: denies photophobia, eye pain, redness, hearing loss, ear pain, congestion, sore throat, rhinorrhea, sneezing, neck pain, neck stiffness and tinnitus.  Respiratory: denies SOB, DOE, cough, chest tightness, and wheezing.    Cardiovascular: admits to  palpitations    Gastrointestinal: denies nausea, vomiting, abdominal pain, diarrhea, constipation, blood in stool.  Genitourinary: denies dysuria, urgency, frequency, hematuria, flank pain and difficulty urinating.  Musculoskeletal: denies  myalgias, back pain, joint swelling, arthralgias and gait problem.   Skin: denies pallor, rash and wound.  Neurological: denies dizziness, seizures, syncope, weakness, light-headedness, numbness and headaches.   Hematological: denies adenopathy, easy bruising, personal or family bleeding history.  Psychiatric/ Behavioral: denies suicidal ideation, mood changes, confusion, nervousness, sleep disturbance and agitation.       All other systems are reviewed and negative.    PHYSICAL EXAM: VS:  BP 102/68   Pulse 78   Ht  5\' 4"  (1.626 m)   Wt 201 lb 12.8 oz (91.5 kg)   BMI 34.64 kg/m  , BMI Body mass index is 34.64 kg/m. GEN: Well nourished, well developed, in no acute distress  HEENT: normal  Neck: no JVD, carotid bruits, or masses Cardiac: RRR; no murmurs, rubs, or gallops,no edema  Respiratory:  clear to auscultation bilaterally, normal work of breathing GI: soft, nontender, nondistended, + BS MS: no deformity or atrophy  Skin: warm and dry, no rash Neuro:  Strength and sensation are intact Psych: normal   EKG:  EKG is not ordered today.   Recent Labs: 12/24/2015: TSH 3.42 08/27/2016: Magnesium 1.9 08/29/2016: ALT 12; Platelets 256 10/13/2016: BUN 26; Creatinine, Ser 1.00; Hemoglobin 13.6; Potassium 4.5; Sodium 138    Lipid Panel    Component Value Date/Time   TRIG 116 08/29/2016 0517      Wt Readings from Last 3 Encounters:  12/14/16 201 lb 12.8 oz (91.5 kg)  10/13/16 192 lb (87.1 kg)  08/28/16 198 lb 8 oz (90 kg)      Other studies Reviewed: Additional studies/ records that were reviewed today include: . Review of the above records demonstrates:    ASSESSMENT AND PLAN:  1. Syncope:    No  further episodes of syncope.   2. Chest tightness: She has had a cath in the past ( in [place])  Reportedly has an anomalous coronary artery  I cannot find the report in Epic.  Will ask her to have a copy sent to her house and bring them to the next appt.   3.  Palpitations:  She has sinus tach with any exercise.  LV function is at the low end of normal  Will get a stress echo to make sure that he LV function improves with stress.   4. Diabetes mellitus: Her diabetes has basically resolved and she had after the sleeve surgery.  5. Hypothyroidism: She is on Synthroid. Followed by Dr. Loanne Drilling.  Last TSH is normal.  6. CAD:   Reportedly has congenital CAD with a single coronary artery  + a small posterior lateral artery .  Awaiting cath note   7.  Mildly depressed LV function:  Low normal .  May be due to her congenital CAD ( if we are able to document that this is the case)   Current medicines are reviewed at length with the patient today.  The patient does not have concerns regarding medicines.  The following changes have been made:  no change  Labs/ tests ordered today include:  No orders of the defined types were placed in this encounter.    Disposition:   FU with me in 6  months .     Mertie Moores, MD  12/14/2016 2:43 PM    [address], [place], [place]  [address] Phone: [phone]; Fax: [phone]

## 2016-12-23 NOTE — Telephone Encounter (Signed)
Patient called in reference to T4 levels. Patient's PCP stated they were "through the roof" and wanted patient to follow up on this. Patient stated she never received a call back from previous messages. I informed patient of Dr. Cordelia Pen note and patient stated she would still like a call back. Please call patient and advise.

## 2017-01-06 ENCOUNTER — Ambulatory Visit (HOSPITAL_BASED_OUTPATIENT_CLINIC_OR_DEPARTMENT_OTHER)
Admission: RE | Admit: 2017-01-06 | Discharge: 2017-01-06 | Disposition: A | Discharge status: Home / Self Care | Payer: BLUE CROSS/BLUE SHIELD | Source: Ambulatory Visit | Attending: Cardiovascular Disease | Admitting: Cardiovascular Disease

## 2017-01-06 ENCOUNTER — Telehealth: Payer: Self-pay | Admitting: Cardiovascular Disease

## 2017-01-06 ENCOUNTER — Other Ambulatory Visit: Payer: Self-pay

## 2017-01-06 DIAGNOSIS — R0609 Other forms of dyspnea: Secondary | ICD-10-CM | POA: Diagnosis not present

## 2017-01-06 DIAGNOSIS — R000 Tachycardia, unspecified: Secondary | ICD-10-CM | POA: Diagnosis not present

## 2017-01-06 DIAGNOSIS — R42 Dizziness and giddiness: Secondary | ICD-10-CM

## 2017-01-06 MED ORDER — PERFLUTREN LIPID MICROSPHERE
1.0000 mL | INTRAVENOUS | Status: AC | PRN
  Administered 2017-01-06: 2 mL via INTRAVENOUS
  Filled 2017-01-06: qty 10

## 2017-01-06 MED FILL — Perflutren Lipid Microsphere IV Susp 1.1 MG/ML: INTRAVENOUS | Qty: 10 | Status: AC

## 2017-01-06 NOTE — Progress Notes (Signed)
  Echocardiogram Echocardiogram Stress Test has been performed.  Tresa Res 01/06/2017, 12:28 PM

## 2017-01-06 NOTE — Telephone Encounter (Signed)
Spoke with patient and advised her that I will review her message with Dr. Acie Fredrickson tomorrow when he has been able to review the echo results. She states today she exercised for 6 1/2 minutes for the echo; states she requested to stay on for 15 minutes but staff would not allow it. She states she passes out if she exercises for 15 minutes.  She describes this as feeling hot, then dizzy, and if she doesn't lay down she will pass out.  She requests to wear a monitor during exercise to see what is happening. I advised that I will call her back tomorrow with Dr. Elmarie Shiley advice. She verbalized understanding and agreement and thanked me for the call.

## 2017-01-06 NOTE — Telephone Encounter (Signed)
New message     STAT if HR is under 50 or over 120 (normal HR is 60-100 beats per minute)  What is your heart rate?  "over 200 when she exercises" 1) Do you have a log of your heart rate readings (document readings)? no  Do you have any other symptoms? No Pt had stress echo today at the [facility].  She is requesting Dr Acie Fredrickson order a 30 day monitor because she states that when she exercises, she passes out.  Patient is concerned that the stress echo did not pick up this info because while on the treadmill, her heart rate did not get very high.  Patient states that she passed out yesterday while exercising and her heart rate was over 200.  Please call

## 2017-01-07 NOTE — Telephone Encounter (Signed)
Reviewed results of stress echo with patient and reviewed Dr. Elmarie Shiley advice. She is scheduled for a 30 day event monitor on 10/18. I advised her to call back sooner with questions or concerns or if she has further events of syncope. She thanked me for the call.

## 2017-01-07 NOTE — Telephone Encounter (Signed)
I would recommend that she continue to exercise, Build up gradually, She may wear a 30 day event monitor since she is having dizziness with exercise

## 2017-01-13 ENCOUNTER — Other Ambulatory Visit (HOSPITAL_COMMUNITY): Payer: BLUE CROSS/BLUE SHIELD

## 2017-01-18 ENCOUNTER — Other Ambulatory Visit (HOSPITAL_COMMUNITY): Payer: BLUE CROSS/BLUE SHIELD

## 2017-01-21 ENCOUNTER — Other Ambulatory Visit: Payer: Self-pay | Admitting: Cardiovascular Disease

## 2017-01-21 ENCOUNTER — Ambulatory Visit (INDEPENDENT_AMBULATORY_CARE_PROVIDER_SITE_OTHER): Payer: BLUE CROSS/BLUE SHIELD

## 2017-01-21 DIAGNOSIS — R000 Tachycardia, unspecified: Secondary | ICD-10-CM

## 2017-01-21 DIAGNOSIS — R42 Dizziness and giddiness: Secondary | ICD-10-CM | POA: Diagnosis not present

## 2017-02-18 NOTE — Telephone Encounter (Signed)
While speaking with patient regarding her cardiac event monitor activation 11/14 @ 12:41 pm, she stated that while working at desk she felt "lightheaded and dizzy".  Her monitor printout indicates that she was SOB and passed out, which she denies.  After reviewing with Dr. Acie Fredrickson, his recommendation was that she was in a sinus tach rhythm and advised continue monitoring and no further orders were given.

## 2017-03-02 ENCOUNTER — Telehealth: Payer: Self-pay | Admitting: Cardiovascular Disease

## 2017-03-02 NOTE — Telephone Encounter (Signed)
Follow up   Pt is returning call to [facility] for results for holter monitor

## 2017-03-02 NOTE — Telephone Encounter (Signed)
Follow Up Call ....  Please call  Back at this number [phone]

## 2017-03-02 NOTE — Telephone Encounter (Signed)
Reviewed monitor results and plan to continue current treatment plan with patient who verbalized understanding.

## 2017-03-22 LAB — ECHOCARDIOGRAM STRESS TEST
CSEPED: 6 min
CSEPEW: 9 METS
Exercise duration (sec): 17 s
Peak HR: 176 {beats}/min
Percent HR: 100 %
Rest HR: 73 {beats}/min

## 2019-01-31 IMAGING — CT CT ABD-PELV W/ CM
2 of 4 series · 15 of 46 positions shown, 17 images · IV contrast (iopamidol)
Comparison: Abdominal CT dated 09/29/2015

CLINICAL DATA: 35-year-old female with left upper quadrant
abdominal pain. History of gastric sleeve in November 2015 and
intermittent abdominal pain.

EXAM:
CT ABDOMEN AND PELVIS WITH CONTRAST
TECHNIQUE: Multidetector CT imaging of the abdomen and pelvis was performed
using the standard protocol following bolus administration of
intravenous contrast.
CONTRAST:  1 NRNOT1-RNN IOPAMIDOL (NRNOT1-RNN) INJECTION 61%

[Series 2: abd/pel with · axial · 0.91mm/px · z∈[-143,+282]mm · 12 of 96 slices shown, 14 images]
[im 6/96  soft-tissue]
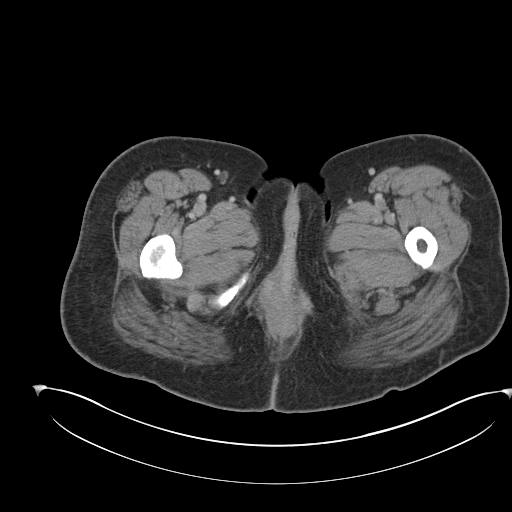
[im 6/96  bone]
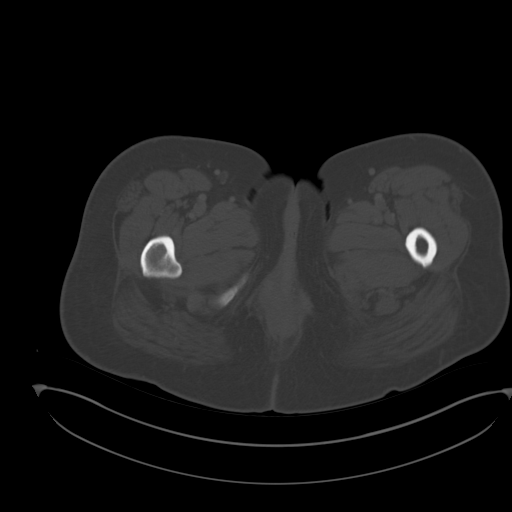
[im 16/96  soft-tissue]
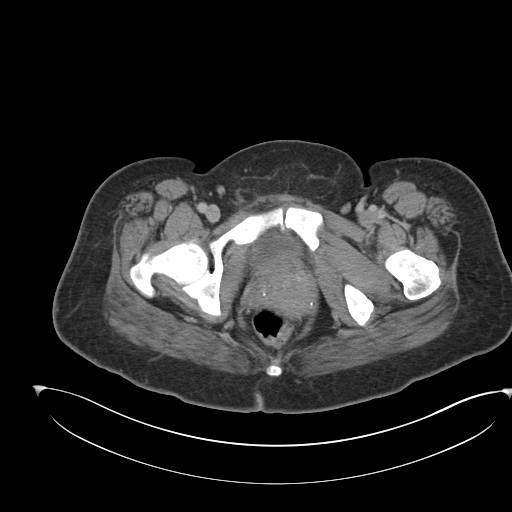
[im 21/96  soft-tissue]
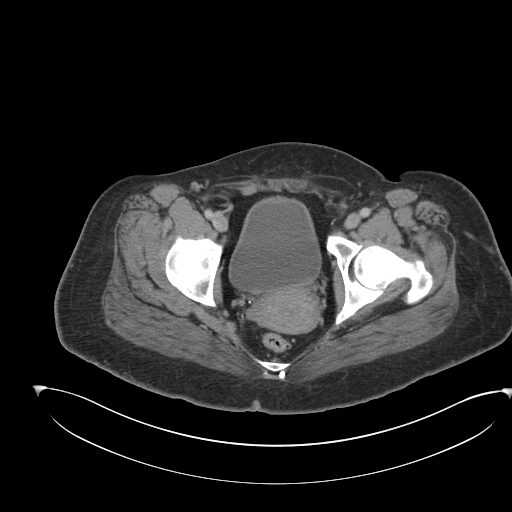
[im 31/96  soft-tissue]
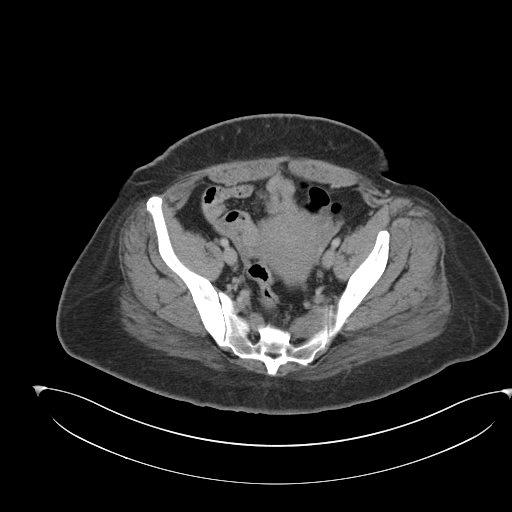
[im 36/96  soft-tissue]
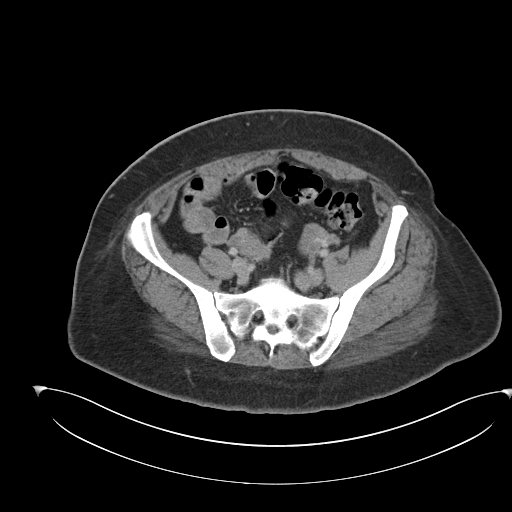
[im 46/96  soft-tissue]
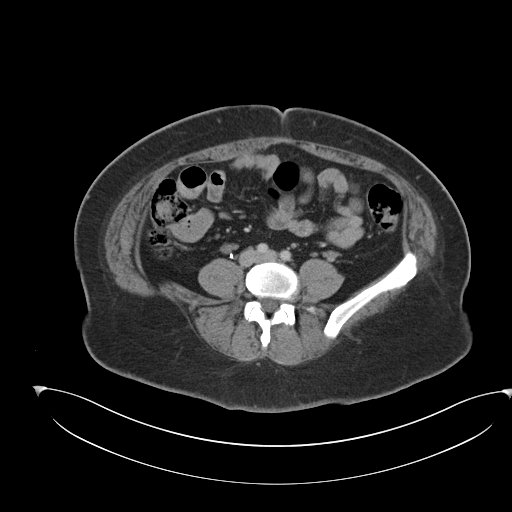
[im 51/96  soft-tissue]
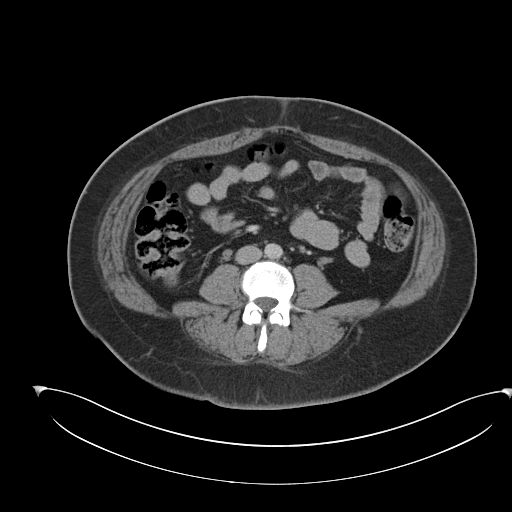
[im 61/96  soft-tissue]
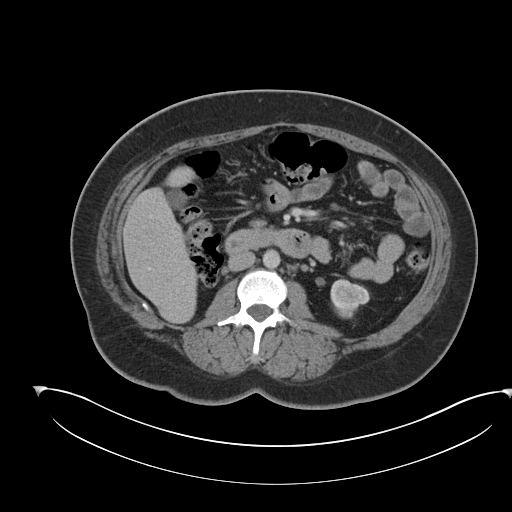
[im 66/96  soft-tissue]
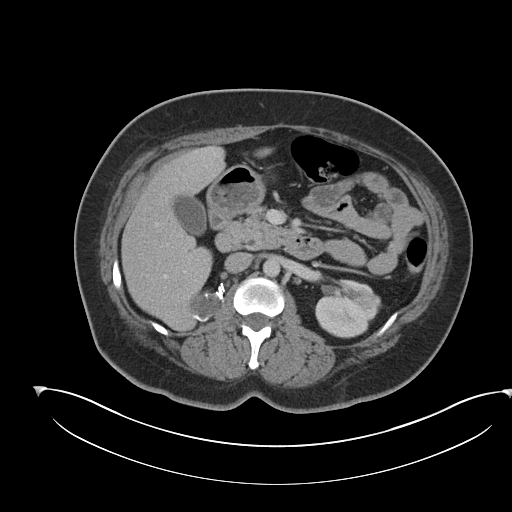
[im 66/96  bone]
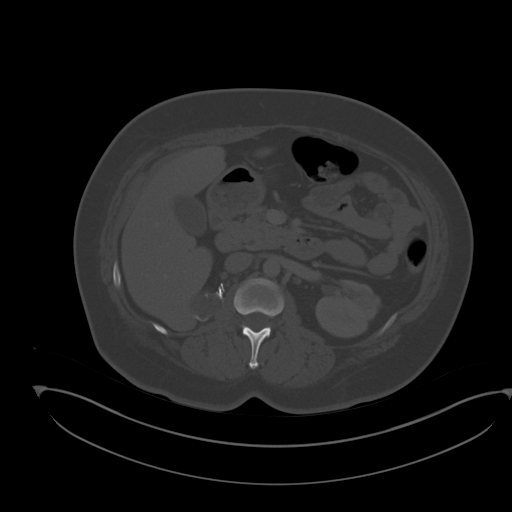
[im 76/96  soft-tissue]
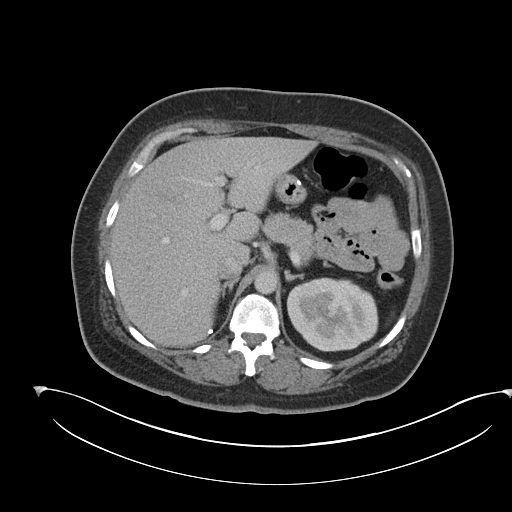
[im 81/96  soft-tissue]
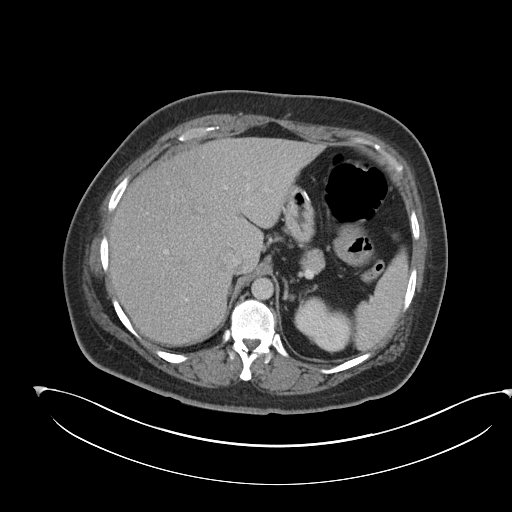
[im 91/96  soft-tissue]
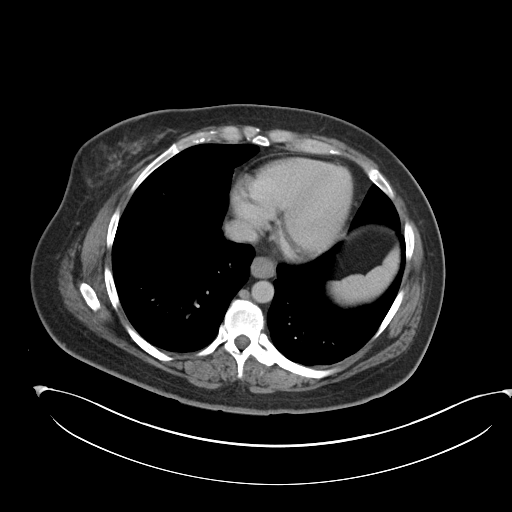

[Series 3: coronal a/|p · coronal · 0.74mm/px · 3 of 190 slices shown]
[im 64/190  soft-tissue]
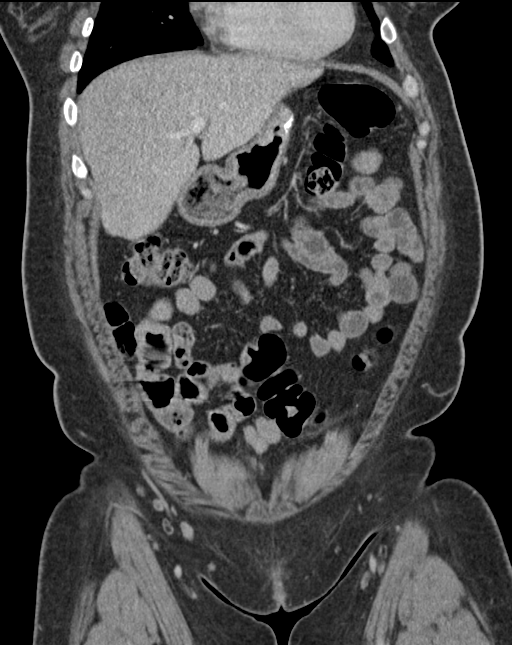
[im 85/190  soft-tissue]
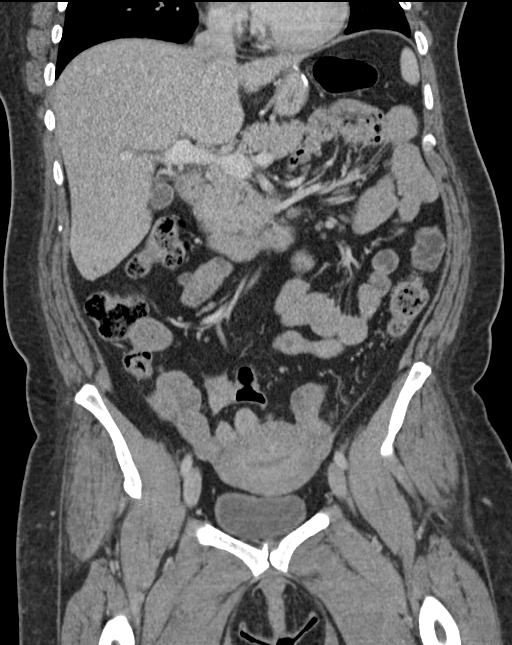
[im 106/190  soft-tissue]
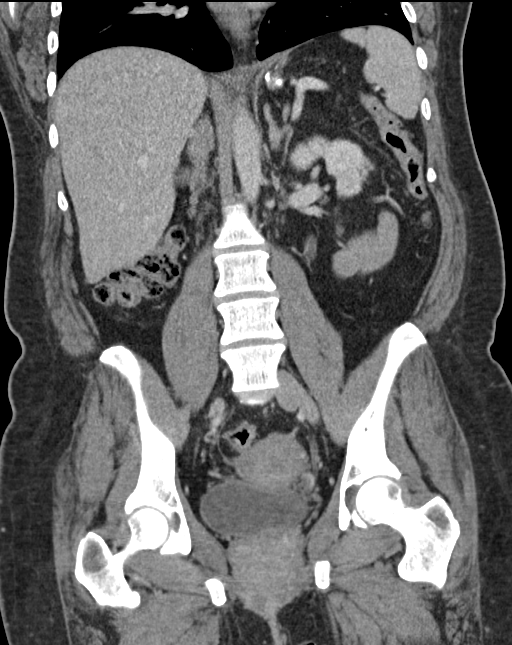

[15 of 46 positions shown; findings below may reference images not displayed]

FINDINGS: Lower chest: The visualized lung bases are clear.

No intra-abdominal free air or free fluid.

Hepatobiliary: No focal liver abnormality is seen. No gallstones,
gallbladder wall thickening, or biliary dilatation.

Pancreas: Unremarkable. No pancreatic ductal dilatation or
surrounding inflammatory changes.

Spleen: Normal in size without focal abnormality.

Adrenals/Urinary Tract: The adrenal glands appear unremarkable.
There is stable area of atrophy involving the inferior pole of the
left kidney, likely related to chronic or recurrent infection and
infarct. Punctate nonobstructing left renal interpolar stone as well
as probable small nonobstructing inferior pole calculus. There is no
hydronephrosis on the left. There is postsurgical changes of right
nephrectomy. A stable 5.0 x 3.3 cm cystic structure noted at the
nephrectomy bed similar to prior CTs. The visualized left ureter and
urinary bladder appears unremarkable.

Stomach/Bowel: There is postsurgical changes of gastric sleeve.
There is no evidence of bowel obstruction or active inflammation.
Moderate stool noted within the colon. Normal appendix.

Vascular/Lymphatic: The abdominal aorta and IVC appear unremarkable.
The origins of the celiac axis, SMA, IMA are patent. No portal
venous gas identified. There is no adenopathy.

Reproductive: The uterus is anteverted. Small subcentimeter foci of
myometrial enhancement noted which may represent small fibroids. The
ovaries appear unremarkable.

Other: Small fat containing umbilical hernia.

Musculoskeletal: There is degenerative changes of the spine. No
acute fracture.
IMPRESSION: 1. No acute intra-abdominopelvic pathology. Postsurgical changes of
gastric sleeve. No evidence of bowel obstruction or active
inflammation. Normal appendix.
2. Status post prior right nephrectomy with a stable cystic
structure in the right nephrectomy bed.
3. Left renal inferior pole atrophy, are likely related to chronic
infection and scarring. Probable punctate nonobstructing left renal
calculi. No hydronephrosis.

## 2019-05-12 ENCOUNTER — Encounter: Payer: Self-pay | Admitting: Cardiovascular Disease

## 2020-06-06 ENCOUNTER — Encounter: Payer: Self-pay | Admitting: Cardiovascular Disease

## 2020-06-06 NOTE — Progress Notes (Signed)
This encounter was created in error - please disregard.

## 2020-06-07 ENCOUNTER — Encounter: Payer: BC Managed Care – PPO | Admitting: Cardiovascular Disease
